# Patient Record
Sex: Male | Born: 1950 | Race: White | Hispanic: No | Marital: Married | Smoking: Current every day smoker
Health system: Southern US, Community
[De-identification: ages and names within clinical notes are randomized; demographics above are authoritative.]

## PROBLEM LIST (undated history)

## (undated) DIAGNOSIS — M4306 Spondylolysis, lumbar region: Secondary | ICD-10-CM

## (undated) DIAGNOSIS — F419 Anxiety disorder, unspecified: Secondary | ICD-10-CM

## (undated) DIAGNOSIS — F320 Major depressive disorder, single episode, mild: Secondary | ICD-10-CM

## (undated) DIAGNOSIS — Z87442 Personal history of urinary calculi: Secondary | ICD-10-CM

## (undated) DIAGNOSIS — F4310 Post-traumatic stress disorder, unspecified: Secondary | ICD-10-CM

## (undated) DIAGNOSIS — M797 Fibromyalgia: Secondary | ICD-10-CM

## (undated) DIAGNOSIS — F32A Depression, unspecified: Secondary | ICD-10-CM

## (undated) DIAGNOSIS — M549 Dorsalgia, unspecified: Secondary | ICD-10-CM

## (undated) DIAGNOSIS — K219 Gastro-esophageal reflux disease without esophagitis: Secondary | ICD-10-CM

## (undated) DIAGNOSIS — Z860100 Personal history of colon polyps, unspecified: Secondary | ICD-10-CM

## (undated) DIAGNOSIS — G8929 Other chronic pain: Secondary | ICD-10-CM

## (undated) DIAGNOSIS — I10 Essential (primary) hypertension: Secondary | ICD-10-CM

## (undated) HISTORY — DX: Gastro-esophageal reflux disease without esophagitis: K21.9

## (undated) HISTORY — DX: Essential (primary) hypertension: I10

## (undated) HISTORY — DX: Anxiety disorder, unspecified: F41.9

## (undated) HISTORY — PX: OTHER SURGICAL HISTORY: SHX169

## (undated) HISTORY — PX: BACK SURGERY: SHX140

## (undated) HISTORY — DX: Depression, unspecified: F32.A

## (undated) HISTORY — DX: Major depressive disorder, single episode, mild: F32.0

---

## 1999-03-24 ENCOUNTER — Encounter: Payer: Self-pay | Admitting: Neurosurgery

## 1999-03-24 ENCOUNTER — Ambulatory Visit (HOSPITAL_COMMUNITY): Admission: RE | Admit: 1999-03-24 | Discharge: 1999-03-24 | Payer: Self-pay | Admitting: Neurosurgery

## 1999-04-10 ENCOUNTER — Ambulatory Visit (HOSPITAL_COMMUNITY): Admission: RE | Admit: 1999-04-10 | Discharge: 1999-04-10 | Payer: Self-pay | Admitting: Neurosurgery

## 1999-04-10 ENCOUNTER — Encounter: Payer: Self-pay | Admitting: Neurosurgery

## 1999-05-05 ENCOUNTER — Encounter: Payer: Self-pay | Admitting: Neurosurgery

## 1999-05-05 ENCOUNTER — Ambulatory Visit (HOSPITAL_COMMUNITY): Admission: RE | Admit: 1999-05-05 | Discharge: 1999-05-05 | Payer: Self-pay | Admitting: Neurosurgery

## 2000-11-11 ENCOUNTER — Ambulatory Visit (HOSPITAL_COMMUNITY): Admission: RE | Admit: 2000-11-11 | Discharge: 2000-11-11 | Payer: Self-pay | Admitting: Family Medicine

## 2000-11-11 ENCOUNTER — Encounter: Payer: Self-pay | Admitting: Family Medicine

## 2001-02-19 ENCOUNTER — Encounter: Payer: Self-pay | Admitting: Specialist

## 2001-02-19 ENCOUNTER — Encounter: Admission: RE | Admit: 2001-02-19 | Discharge: 2001-02-19 | Payer: Self-pay | Admitting: Specialist

## 2002-06-17 ENCOUNTER — Ambulatory Visit (HOSPITAL_COMMUNITY): Admission: RE | Admit: 2002-06-17 | Discharge: 2002-06-17 | Payer: Self-pay | Admitting: Family Medicine

## 2002-06-17 ENCOUNTER — Encounter: Payer: Self-pay | Admitting: Family Medicine

## 2002-07-07 ENCOUNTER — Encounter: Payer: Self-pay | Admitting: Neurosurgery

## 2002-07-07 ENCOUNTER — Encounter: Admission: RE | Admit: 2002-07-07 | Discharge: 2002-07-07 | Payer: Self-pay | Admitting: Neurosurgery

## 2002-07-27 ENCOUNTER — Encounter: Payer: Self-pay | Admitting: Neurosurgery

## 2002-07-27 ENCOUNTER — Encounter: Admission: RE | Admit: 2002-07-27 | Discharge: 2002-07-27 | Payer: Self-pay | Admitting: Neurosurgery

## 2002-09-11 ENCOUNTER — Encounter: Payer: Self-pay | Admitting: Neurosurgery

## 2002-09-15 ENCOUNTER — Encounter: Payer: Self-pay | Admitting: Neurosurgery

## 2002-09-15 ENCOUNTER — Inpatient Hospital Stay (HOSPITAL_COMMUNITY): Admission: RE | Admit: 2002-09-15 | Discharge: 2002-09-16 | Payer: Self-pay | Admitting: Neurosurgery

## 2002-11-05 ENCOUNTER — Encounter: Payer: Self-pay | Admitting: Neurosurgery

## 2002-11-05 ENCOUNTER — Ambulatory Visit (HOSPITAL_COMMUNITY): Admission: RE | Admit: 2002-11-05 | Discharge: 2002-11-05 | Payer: Self-pay | Admitting: Neurosurgery

## 2003-03-01 ENCOUNTER — Ambulatory Visit (HOSPITAL_COMMUNITY): Admission: RE | Admit: 2003-03-01 | Discharge: 2003-03-01 | Payer: Self-pay | Admitting: Family Medicine

## 2003-03-30 ENCOUNTER — Ambulatory Visit (HOSPITAL_COMMUNITY): Admission: RE | Admit: 2003-03-30 | Discharge: 2003-03-30 | Payer: Self-pay | Admitting: Family Medicine

## 2003-05-14 ENCOUNTER — Inpatient Hospital Stay (HOSPITAL_COMMUNITY): Admission: RE | Admit: 2003-05-14 | Discharge: 2003-05-18 | Payer: Self-pay | Admitting: Neurosurgery

## 2003-09-02 ENCOUNTER — Ambulatory Visit (HOSPITAL_COMMUNITY): Admission: RE | Admit: 2003-09-02 | Discharge: 2003-09-02 | Payer: Self-pay | Admitting: Internal Medicine

## 2003-12-15 ENCOUNTER — Ambulatory Visit (HOSPITAL_COMMUNITY): Admission: RE | Admit: 2003-12-15 | Discharge: 2003-12-15 | Payer: Self-pay | Admitting: Neurosurgery

## 2003-12-28 ENCOUNTER — Emergency Department (HOSPITAL_COMMUNITY): Admission: EM | Admit: 2003-12-28 | Discharge: 2003-12-28 | Payer: Self-pay | Admitting: Emergency Medicine

## 2004-03-21 ENCOUNTER — Ambulatory Visit (HOSPITAL_COMMUNITY): Admission: RE | Admit: 2004-03-21 | Discharge: 2004-03-21 | Payer: Self-pay | Admitting: Family Medicine

## 2004-09-25 ENCOUNTER — Ambulatory Visit (HOSPITAL_COMMUNITY): Admission: RE | Admit: 2004-09-25 | Discharge: 2004-09-25 | Payer: Self-pay | Admitting: Orthopedic Surgery

## 2005-03-11 ENCOUNTER — Inpatient Hospital Stay (HOSPITAL_COMMUNITY): Admission: EM | Admit: 2005-03-11 | Discharge: 2005-03-14 | Payer: Self-pay | Admitting: Emergency Medicine

## 2005-03-12 ENCOUNTER — Ambulatory Visit: Payer: Self-pay | Admitting: Internal Medicine

## 2005-03-13 ENCOUNTER — Encounter: Payer: Self-pay | Admitting: Internal Medicine

## 2005-03-13 HISTORY — PX: ESOPHAGOGASTRODUODENOSCOPY: SHX1529

## 2005-03-13 HISTORY — PX: COLONOSCOPY: SHX174

## 2005-03-30 ENCOUNTER — Ambulatory Visit (HOSPITAL_COMMUNITY): Admission: RE | Admit: 2005-03-30 | Discharge: 2005-03-30 | Payer: Self-pay | Admitting: Neurosurgery

## 2005-07-14 ENCOUNTER — Emergency Department (HOSPITAL_COMMUNITY): Admission: EM | Admit: 2005-07-14 | Discharge: 2005-07-14 | Payer: Self-pay | Admitting: Emergency Medicine

## 2006-04-17 ENCOUNTER — Ambulatory Visit (HOSPITAL_COMMUNITY): Admission: RE | Admit: 2006-04-17 | Discharge: 2006-04-17 | Payer: Self-pay | Admitting: Neurosurgery

## 2006-04-23 HISTORY — PX: NECK SURGERY: SHX720

## 2006-04-26 ENCOUNTER — Ambulatory Visit (HOSPITAL_COMMUNITY): Admission: RE | Admit: 2006-04-26 | Discharge: 2006-04-27 | Payer: Self-pay | Admitting: Neurosurgery

## 2006-12-30 ENCOUNTER — Ambulatory Visit (HOSPITAL_COMMUNITY): Admission: RE | Admit: 2006-12-30 | Discharge: 2006-12-30 | Payer: Self-pay | Admitting: Neurosurgery

## 2007-10-29 ENCOUNTER — Emergency Department (HOSPITAL_COMMUNITY): Admission: EM | Admit: 2007-10-29 | Discharge: 2007-10-29 | Payer: Self-pay | Admitting: Emergency Medicine

## 2009-05-26 ENCOUNTER — Emergency Department (HOSPITAL_COMMUNITY): Admission: EM | Admit: 2009-05-26 | Discharge: 2009-05-26 | Payer: Self-pay | Admitting: Emergency Medicine

## 2010-05-13 ENCOUNTER — Encounter: Payer: Self-pay | Admitting: Neurosurgery

## 2010-07-12 LAB — URINALYSIS, ROUTINE W REFLEX MICROSCOPIC
Bilirubin Urine: NEGATIVE
Glucose, UA: NEGATIVE mg/dL
Ketones, ur: NEGATIVE mg/dL
Nitrite: NEGATIVE
Protein, ur: NEGATIVE mg/dL
Specific Gravity, Urine: 1.025 (ref 1.005–1.030)
Urobilinogen, UA: 0.2 mg/dL (ref 0.0–1.0)
pH: 5 (ref 5.0–8.0)

## 2010-07-12 LAB — URINE MICROSCOPIC-ADD ON

## 2010-07-12 LAB — DIFFERENTIAL
Basophils Absolute: 0 10*3/uL (ref 0.0–0.1)
Basophils Relative: 0 % (ref 0–1)
Eosinophils Absolute: 0.2 K/uL (ref 0.0–0.7)
Eosinophils Relative: 2 % (ref 0–5)
Lymphocytes Relative: 20 % (ref 12–46)
Lymphs Abs: 2.4 K/uL (ref 0.7–4.0)
Monocytes Absolute: 1.1 10*3/uL — ABNORMAL HIGH (ref 0.1–1.0)
Monocytes Relative: 9 % (ref 3–12)
Neutro Abs: 8.6 10*3/uL — ABNORMAL HIGH (ref 1.7–7.7)
Neutrophils Relative %: 70 % (ref 43–77)

## 2010-07-12 LAB — BASIC METABOLIC PANEL WITH GFR
BUN: 16 mg/dL (ref 6–23)
Chloride: 106 meq/L (ref 96–112)
Creatinine, Ser: 1.32 mg/dL (ref 0.4–1.5)
GFR calc Af Amer: >60 mL/min (ref >60)
GFR calc non Af Amer: 56 mL/min — ABNORMAL LOW (ref >60)
Potassium: 4 meq/L (ref 3.5–5.1)

## 2010-07-12 LAB — URINE CULTURE
Colony Count: NO GROWTH
Culture: NO GROWTH

## 2010-07-12 LAB — CBC
HCT: 47 % (ref 39.0–52.0)
Hemoglobin: 16.3 g/dL (ref 13.0–17.0)
MCHC: 34.7 g/dL (ref 30.0–36.0)
MCV: 96.1 fL (ref 78.0–100.0)
Platelets: 236 K/uL (ref 150–400)
RBC: 4.89 MIL/uL (ref 4.22–5.81)
RDW: 12.5 % (ref 11.5–15.5)
WBC: 12.3 K/uL — ABNORMAL HIGH (ref 4.0–10.5)

## 2010-07-12 LAB — BASIC METABOLIC PANEL
CO2: 25 mEq/L (ref 19–32)
Calcium: 9 mg/dL (ref 8.4–10.5)
Glucose, Bld: 98 mg/dL (ref 70–99)
Sodium: 137 mEq/L (ref 135–145)

## 2010-09-08 NOTE — Discharge Summary (Signed)
NAME:  Perry Cervantes, Perry Cervantes                       ACCOUNT NO.:  [id number]   MEDICAL RECORD NO.:  [id number]                   PATIENT TYPE:  INP   LOCATION:  3023                                 FACILITY:  [facility]   PHYSICIAN:  Danae Orleans. Venetia Maxon, M.D.               DATE OF BIRTH:  08/22/1950   DATE OF ADMISSION:  05/14/2003  DATE OF DISCHARGE:  05/18/2003                                 DISCHARGE SUMMARY   REASON FOR ADMISSION:  1. Lumbar disk displacement.  2. Lumbosacral spondylolisthesis,  3. Lumbar disk degeneration.  4. Hypertension, not otherwise specified.  5. Esophageal reflux.  6. Tobacco use disorder.   FINAL DIAGNOSES:  1. Lumbar disk displacement.  2. Lumbosacral spondylolisthesis,  3. Lumbar disk degeneration.  4. Hypertension, not otherwise specified.  5. Esophageal reflux.  6. Tobacco use disorder.   HISTORY OF ILLNESS AND HOSPITAL COURSE:  [name] is a 60 year old  man who has previously gone out on disability retirement for lumbar disk  degenerative disease and also had a L4-5 disk herniation laterally. The  patient had surgery for that and actually got considerably better. He then  came back to the office after a fall saying that he was having a lot more  back pain, pain going into his legs. He had radiographs which demonstrated  inferior facet fractures at L4-5 with retrolisthesis although no frank  significant motion at L4-5 on flexion extension views. The patient was  miserable with pain, remained predominantly in his back, and is therefore  elected to take him to surgery for lumbar decompression and fusion at the L4-  5 level. At the time of surgery, he was found to have L4 facet fractures,  spondylosis, degenerative disk disease, recurrent disk herniation, and  radiculopathy. He underwent decompression and fusion at the L4-5 level.  Postoperatively, he had full strength in his lower extremities with  decreased pain and was doing well on January 25. He  was up and ambulating in  a brace without significant pain or weakness. He was discharged home on  Percocet.   DISCHARGE CONDITION:  Improved. Follow up in three weeks.                                                Danae Orleans. Venetia Maxon, M.D.    JDS/MEDQ  D:  07/05/2003  T:  07/06/2003  Job:  [id number]

## 2010-09-08 NOTE — Op Note (Signed)
NAME:  Perry Cervantes, Perry Cervantes                       ACCOUNT NO.:  [id number]   MEDICAL RECORD NO.:  [id number]                   PATIENT TYPE:  INP   LOCATION:  3172                                 FACILITY:  [facility]   PHYSICIAN:  Danae Orleans. Venetia Maxon, M.D.               DATE OF BIRTH:  Jun 12, 1950   DATE OF PROCEDURE:  09/15/2002  DATE OF DISCHARGE:                                 OPERATIVE REPORT   PREOPERATIVE DIAGNOSIS:  Far-lateral herniated disk, L4-5, right, with  spondylosis, degenerative disk disease and radiculopathy.   POSTOPERATIVE DIAGNOSIS:  Far-lateral herniated disk, L4-5, right, with  spondylosis, degenerative disk disease and radiculopathy.   PROCEDURE:  Far-lateral microdiskectomy, L4-5, right, with Metrix tubular  retractor and microdissection.   SURGEON:  Danae Orleans. Venetia Maxon, M.D.   ASSISTANT:  Hewitt Shorts, M.D.   ANESTHESIA:  General endotracheal anesthesia.   ESTIMATED BLOOD LOSS:  Minimal.   COMPLICATIONS:  None.   DISPOSITION:  Recovery.   INDICATION:  The patient is a 60 year old man with a right L4 radiculopathy,  with a far-lateral disk herniation at the L4-5 level.  He has undergone  multiple steroid injections without relief of pain and it was subsequently  elected to take him to surgery for a far-lateral microdiskectomy.   PROCEDURE:  The patient was brought to the operating room.  Following the  successful and uncomplicated induction of general endotracheal anesthesia,  the patient was placed in a prone position on the operating table.  His low  back was then prepped and draped in the usual sterile fashion.  Using  fluoroscopy to localize, a marker was placed between the two pedicles on the  right side, the pedicle of L4 and the pedicle of L5, and a Steinmann was  placed over the pars interarticularis of L4.  The sequential dilators were  then used and subsequently, a 7-mm-long Metrix tubular retractor was placed  and anchored.  Its position was  confirmed on AP and lateral fluoroscopy.  Subsequently, using electrocautery, soft tissue overlying the L4 pars was  then cauterized and then removed.  The pars interarticularis was then  drilled down with the Anspach drill and an A2 equivalent burr and removal of  bone was completed with a 3-mm Kerrison rongeur.  The L4 nerve root was  identified just beneath the ligamentous layer, which was removed with the  Kerrison rongeur, and the nerve root was identified as it coursed overlying  the L4-5 lateral disk.  There was a large amount of osteophyte and also  herniated disk material and the disk space was then incised with an 11 blade  and disk material was removed in a piecemeal fashion.  Subsequently, the  osteophytes were removed with the osteophyte-removing tool and this resulted  in significant decompression of the nerve root.  Using microdissection  technique, the endplates were stripped of residual disk material and the  lateral-most aspect of the interspace  was also decompressed.  Medial and  lateral decompression was performed using the pituitary rongeurs.  The wound  was then copiously irrigated with Bacitracin and saline and then Gelfoam  with thrombin was used to facilitate hemostasis.  Subsequently, the tubular  retractor was removed.  The wound was then closed with 3-0 Vicryl sutures  and dressed with Dermabond.  The patient was extubated in the operating room  and taken to the recovery room in stable and satisfactory condition, having  tolerated his operation well.  Counts were correct at the end of the case.                                               Danae Orleans. Venetia Maxon, M.D.    JDS/MEDQ  D:  09/15/2002  T:  09/15/2002  Job:  [id number]

## 2010-09-08 NOTE — Consult Note (Signed)
NAME:  Perry Cervantes, Perry Cervantes             ACCOUNT NO.:  [id number]   MEDICAL RECORD NO.:  [id number]          PATIENT TYPE:  INP   LOCATION:  A319                          FACILITY:  [facility]   PHYSICIAN:  R. Roetta Sessions, M.D. DATE OF BIRTH:  [birth year]   DATE OF CONSULTATION:  DATE OF DISCHARGE:                                   CONSULTATION   REQUESTING PHYSICIAN:  Patrica Duel, M.D.   REASON FOR CONSULTATION:  Abdominal pain, diarrhea, intermittent  hematochezia.   HISTORY OF PRESENT ILLNESS:  Perry Cervantes is a 60 year old Caucasian male who  notes about three days ago he began to have mid-to-low abdominal pain. He is  a security guard, and he felt the pain just below his belt line.  He  describes the pain as burning.  The pain progressively got worse.  It is a  constant pain.  He did have shooting pains down into his groin as well.  He  had significant abdominal bloating and rated the pain a 9/10 on the pain  scale.  He had some nausea but denied any vomiting.  He continued to have  dry heaves as well.  He also complains of heartburn and water brash which is  refractory.  He has been on Nexium 40 mg daily and has had chronic GERD  symptoms for 3 years now.  He denies any dysphagia or odynophagia.  He  denies any regurgitation.  On admission his white blood cell count was 10.6.  It is now up to 13.9.  He has been given Dilaudid 2 mg q.3 h. p.r.n. for  pain which has worked well for him. He has had a CT of the abdomen and  pelvis which reportedly was normal, although I have not seen the report.  He  complains of diarrhea as well.  He has had loose watery stools up to 5x a  day for the last 3 days.  He denies any new medications, foreign travel, or  new pets.  He has been on antibiotics within the last 3-4 months.  He takes  Advil 200 mg 6-8 a day, about 4 days a week.   PAST MEDICAL HISTORY:  1.  Hypertension.  2.  Chronic GERD.  3.  Vertebral fracture with back surgery and chronic  back pain, right knee      arthroscopy and depression.   MEDICATIONS PRIOR TO ADMISSION:  1.  Percocet p.r.n.  2.  Nexium 40 mg daily.  3.  Norvasc 5 mg daily.  4.  Ziac 10 mg daily.   ALLERGIES:  No known drug allergies.   FAMILY HISTORY:  Mother age 11 has a history of MI.  Father deceased at age  66 secondary to staph sepsis.  He has one brother deceased secondary to an  MVA.  One brother and one sister who is alive and healthy   SOCIAL HISTORY:  Perry Cervantes has been in his second marriage for 13 years.  He has five grown, healthy children.  One step-child.  He has a 30-pack-year  history of tobacco use.  He drinks occasional beer once or twice  a month.  He denies any drug use.  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   REVIEW OF SYSTEMS:  CONSTITUTIONAL:  Weight has been steadily decreasing  since he has gone back to work; he feels he is not consuming as many  calories.  He is complaining of some fatigue; he denies any fever or chills.  Denies any anorexia or early satiety.  CARDIOVASCULAR:  Denies any chest  pain or palpitations.  PULMONARY:  Denies any shortness of breath, dyspnea,  cough or hemoptysis.  GI:  See HPI.  GU: Denies any dysuria, hematuria,  increased urinary frequency.  He did report some scrotal swelling a couple  of months ago for which he was seen at [facility].  He was  treated with antibiotics.   PHYSICAL EXAMINATION:  VITAL SIGNS:  Weight 193.4 pounds.  Height 71 inches.  Temperature 98.2, pulse 95, respirations 20, blood pressure 126/72.  GENERAL:  Perry Cervantes is a well-developed, well-nourished, Caucasian male in  no acute distress.  HEENT:  Sclerae are clear.  Nonicteric.  Conjunctivae pink. Oropharynx pink  and moist without any lesions.  NECK:  Supple without any masses or thyromegaly.  HEART:  Regular rate and rhythm with normal S1-S2.  No murmurs, rubs,  thrills, or  gallops  LUNGS:  Clear to auscultation bilaterally.  ABDOMEN:  Positive bowel sounds x4; no bruits auscultated. Soft, nontender,  nondistended.  No palpable mass or hepatosplenomegaly.  No rebound  tenderness or guarding.  EXTREMITIES:  Without edema or clubbing bilaterally.  SKIN:  Pink, warm and dry.  He does have a ruddy facial complexion.  No rash  or jaundice.   LABORATORY STUDIES:  WBC 13.9 and hemoglobin 13.5, hematocrit 38.1,  platelets 219.  Calcium 8.5, sodium 139, potassium 3.2, chloride 112, CO2  20, BUN 16, creatinine 1, and glucose 95, amylase 48, and lipase 19.  Serum  alcohol was less than 5.  Urinalysis trace ketones, 30 protein, 36 wbc's,  rbc's, and rare bacteria.   IMPRESSION:  Perry Cervantes is a 60 year old Caucasian male with a 3-day  history of mid abdominal pain which he describes as a burning, 9/10 on a  pain scale along with nausea and bloating.  CT scan reportedly was normal,  although this needs to be reviewed.  He also has had watery diarrhea up to 5  stools a day. He has a 30-pack-year history of tobacco abuse, therefore,  could be __________ .  We need to consider ischemic colitis.  Also he has  significant NSAID use and we should think about NSAID induced colitis as  well.  Other possibilities include food borne illness, or a gastroenteritis.  He has had intermittent hematochezia as well.   As far as his chronic refractory GERD symptoms are concerned, we will  proceed with EGD given his chronic NSAID use.  He is also at risk for peptic  ulcer disease as well.   PLAN:  1.  We will schedule colonoscopy and EGD in the morning with Dr. Jena Gauss.  I      have discussed both procedures including risks and benefits which      include, but are not limited to, bleeding, perforation, and infection,      and drug reaction.  He agrees with this plan, and consent will be      obtained. 2.  We will check CBC and LFTs in the morning.  3.  Clear liquid diet for now  until midnight and then NPO after midnight for      procedure.  We will prep with GoLYTELY, 4 liters over 4 hours to begin      at 5 p.m.; followed by 2 Fleets enemas in the morning prior to the      procedure.      Nicholas Lose, N.P.      Jonathon Bellows, M.D.  Electronically Signed    KC/MEDQ  D:  03/12/2005  T:  03/12/2005  Job:  [id number]   cc:   Patrica Duel, M.D.  Fax: [phone]

## 2010-09-08 NOTE — Discharge Summary (Signed)
Perry Cervantes, Perry Cervantes NO.:  [id number]   MEDICAL RECORD NO.:  [id number]          PATIENT TYPE:  INP   LOCATION:  A319                          FACILITY:  [facility]   PHYSICIAN:  Patrica Duel, M.D.    DATE OF BIRTH:  1950/08/09   DATE OF ADMISSION:  03/11/2005  DATE OF DISCHARGE:  11/22/2006LH                                 DISCHARGE SUMMARY   DISCHARGE DIAGNOSES:  1.  Abdominal pain, questionable etiology, probably food-borne illness.      Hepatic flexure polyp on total colonoscopy, pathology benign.  2.  Chronic uncomplicated gastroesophageal reflux disease.  3.  History of hypertension.  4.  Chronic pain secondary to degenerative disk disease (status post several      surgeries).  5.  Mild depression.   HISTORY AND PHYSICAL:  For details regarding admission please refer to the  admitting note.  Briefly this 60 year old male with above history presented  to the emergency department with a 48-hour history of increasing severe  abdominal bloating, intermittent diarrhea, and scant hematochezia.  He had  no urogenital complaints except for some radiation of his pain to the groin.  Workup in the emergency department was benign except for a leukocytosis  (11,000) with left shift.  He had 3-6 white cells.  CT scan negative.  All  laboratory parameters normal except as noted.   Patient was admitted with abdominal pain of questionable etiology, consider  acute colitis.   COURSE IN THE HOSPITAL:  Patient was treated with analgesia and anorexics.  He was also given Cipro and Flagyl empirically.  GI was consulted.  Upper  endoscopy revealed uncomplicated gastroesophageal reflux disease and total  colonoscopy results as noted above.   The patient's symptoms abated and he was stable for discharge on third  hospital day.  Percocet 10/650 daily, Nexium 40 mg daily, Norvasc 5 daily,  Ziac 10 daily.  He will be followed and treated expectantly as an  outpatient.      Patrica Duel, M.D.  Electronically Signed     MC/MEDQ  D:  03/25/2005  T:  03/25/2005  Job:  [id number]

## 2010-09-08 NOTE — H&P (Signed)
NAME:  Perry Cervantes, Perry Cervantes NO.:  [id number]   MEDICAL RECORD NO.:  [id number]          PATIENT TYPE:  INP   LOCATION:  A319                          FACILITY:  [facility]   PHYSICIAN:  Patrica Duel, M.D.    DATE OF BIRTH:  March 10, 1951   DATE OF ADMISSION:  03/11/2005  DATE OF DISCHARGE:  LH                                HISTORY & PHYSICAL   CHIEF COMPLAINT:  Abdominal pain.   HISTORY OF PRESENT ILLNESS:  This is a 60 year old male with a history of  hypertension, gastroesophageal reflux disease, chronic pain secondary to  back surgery and mild depression.  His general state of health is good.   The patient presented to the emergency department with a 48 hour history of  increasingly severe abdominal bloating, intermittent diarrhea with scant  hematochezia.  He had no urogenital complaints except for some radiation of  his pain to his groin.  A workup in the emergency department was essentially  benign with negative CT scan as well as normal lab except for mild  leukocytosis (11,000) with left shift.  Urinalysis significant for 3-6  wbc's, otherwise benign.  All blood parameters including liver functions,  amylase, lipase, etc. are normal.   There has been no history of headache, neurologic deficits, significant  nausea, vomiting, chest pain or shortness of breath, dysuria, hematuria or  urinary frequency or symptoms of prostatism.   The patient is admitted with abdominal pain of questionable etiology.  Consider colitis.   CURRENT MEDICATIONS:  1.  Percocet p.r.n.  2.  Nexium 40 mg daily.  3.  Norvasc 5 mg daily.  4.  Ziac 10 mg daily.   ALLERGIES:  No known drug allergies.   PAST MEDICAL HISTORY:  As noted above.   FAMILY HISTORY:  Noncontributory.   REVIEW OF SYSTEMS:  Negative except as mentioned.   PHYSICAL EXAMINATION:  GENERAL:  A very pleasant, fully alert male in no  acute distress at this time.  VITAL SIGNS:  Temperature 98.7, blood pressure 135/80,  pulse 119 and  unlabored, respirations 20.  HEENT:  Normocephalic, atraumatic.  Pupils are equal.  There is no scleral  icterus.  Ears, nose and throat benign.  NECK:  Supple with no bruits, lymphadenopathy or masses noted.  LUNGS:  Clear.  HEART:  Sounds normal without murmurs, rubs or gallops.  ABDOMEN:  The right upper quadrant is nontender.  Murphy's sign is negative.  There are no appreciable masses.  GENITALIA:  Testes normal.  EXTREMITIES:  No clubbing, cyanosis or edema.  NEUROLOGIC:  Within normal limits.   ASSESSMENT:  Abdominal pain of questionable etiology, possible colitis.  The  patient is a smoker and could have ischemic component.  Full  gastrointestinal evaluation indicated.   PLAN:  GI consult.  Continue IV PPI.  Will keep NPO as colonoscopy is  probably indicated.  Will follow and treat expectantly.      Patrica Duel, M.D.  Electronically Signed     MC/MEDQ  D:  03/12/2005  T:  03/12/2005  Job:  [id number]

## 2010-09-08 NOTE — Op Note (Signed)
NAME:  Perry Cervantes NO.:  [id number]   MEDICAL RECORD NO.:  [id number]          PATIENT TYPE:  OIB   LOCATION:  3172                         FACILITY:  [facility]   PHYSICIAN:  Danae Orleans. Venetia Maxon, M.D.  DATE OF BIRTH:  [birth year]   DATE OF PROCEDURE:  04/26/2006  DATE OF DISCHARGE:                               OPERATIVE REPORT   PREOPERATIVE DIAGNOSIS:  Herniated cervical disk with spondylosis,  degenerative disc disease, and radiculopathy C5-C6 and C6-C7 levels.   POSTOPERATIVE DIAGNOSIS:  Herniated cervical disk with spondylosis,  degenerative disc disease, and radiculopathy C5-C6 and C6-C7 levels.   PROCEDURE:  Anterior cervical decompression and fusion C5-C6 and C6-C7  levels with PEEK interbody cages with morcellized bone autograft,  demineralized bone matrix, and anterior cervical plating.   SURGEON:  Danae Orleans. Venetia Maxon, M.D.   ASSISTANT:  Coletta Memos, M.D. and Georgiann Cocker, RN   ANESTHESIA:  General endotracheal anesthesia.   ESTIMATED BLOOD LOSS:  Minimal.   COMPLICATIONS:  None.   DISPOSITION:  Recovery.   INDICATIONS:  Perry Cervantes is a 60 year old man with left greater  than right upper extremity pain with significant spondylitic disk  degeneration and herniated disks at C5-C6 and C6-C7 levels.  It was  elected to take him to surgery for anterior cervical decompression and  fusion at the C5-C6, C6-C7 levels.   PROCEDURE:  Mr. Perry Cervantes is brought to the operating room.  Following  satisfactory and uncomplicated induction of general endotracheal  anesthesia and placement of intravenous lines, the patient was placed in  supine position on the operating table.  His neck was placed in slight  extension.  He was placed in 10 pounds of cervical traction and his  anterior neck was then prepped and draped in the usual sterile fashion.  The area of planned incision was infiltrated with 0.25% Marcaine and  0.5% lidocaine with 1:100,000 epinephrine.   Incision was made from  midline to the anterior border of the sternocleidomastoid muscle,  carried sharply through platysmal layer.  Subplatysmal dissection was  performed exposing the anterior border of the sternocleidomastoid  muscle.  Using blunt dissection, the carotid sheath was kept lateral and  trachea and esophagus kept medial, exposing the anterior cervical spine.  A bent spinal needle was placed.  It was felt to be the C4-C5 level and  this was confirmed on the next intraoperative x-ray.  Subsequently,  exposure was then performed the C5-C6 and C6-C7 and longus colli muscles  were taken down from the anterior cervical spine from C5-C7 levels  bilaterally using electrocautery and Key elevator.  Large ventral  osteophyte was removed at C6 overlying the C6-C7 interspace and this was  retained for later use in bone grafting.  Self-retaining shadow line  retractor was placed along with up and down retractor.  Interspaces at  C5-C6 and C6-C7 were highly degenerated.  They were incised and disk  material was removed in piecemeal fashion.  End plates were stripped of  residual disk material using a variety of Carlen curets.  Subsequently,  the disk base distractor was placed at C6-C7 and under loupe  magnification using a high-speed drill, the endplates of C6 and C7 were  decorticated, along with large uncinate spurs, which were thinned.  This  bone graft was then retained for later use in bone grafting.  A similar  decompression was then performed at the C5-C6 level and again bone  autograft was retained for later use in bone grafting.  End plates were  decorticated and uncinate spurs were drilled down.  The microscope was  brought into field.  Under microdissection tech, the posterior  longitudinal ligament at C6-C7 was then removed and the spinal cord dura  was decompressed as were both C7 nerve roots as they extended out the  neural foramina.  There was at disk material overlying the  C7 nerve root  on the left.  After hemostasis was assured and following trial sizing, a  7-mm PEEK interbody cage was selected, packed with morcellized bone  autograft which was mixed with small amount of demineralized bone matrix  and this was inserted in the interspace and countersunk appropriately.  Attention was then turned to the C5-C6 level where a similar  decompression was performed.  Again, both C6 nerve roots and the central  spinal cord dura were decompressed.  There was a large amount of central  to left-sided herniated disk material at this level.  Hemostasis again  assured and subsequently a 7 PEEK interbody spacer was selected, packed  with remaining morcellized bone autograft, and demineralized bone matrix  inserted interspace and countersunk appropriately.  Traction weight was  removed.  A 34-mm Tressel anterior cervical plate was then affixed to  the cervical spine using 14-mm variable angle screws, 2 at C5, 2 at C6,  2 at C7.  Final x-ray demonstrated the upper aspect of the construct  which was at C5 and appeared to be well positioned.  It was not possible  to visualize the lower aspect of the construct.  Subsequently, the wound  was irrigated with bacitracin saline.  Soft tissues were inspected and  found to be good repair.  There was excellent hemostasis.  The platysmal  layer was then closed with 3-0 Vicryl sutures.  The skin edges were  approximated with 3-0 Vicryl inverted interrupted sutures.  The wound  was dressed with Dermabond.  The patient was extubated in the operating  room and taken to the recovery room in stable satisfactory having  tolerated this operation well.  Counts were correct at the end of the  case.      Danae Orleans. Venetia Maxon, M.D.  Electronically Signed     JDS/MEDQ  D:  04/26/2006  T:  04/26/2006  Job:  [id number]

## 2010-09-08 NOTE — Op Note (Signed)
NAME:  Perry Cervantes, Perry Cervantes             ACCOUNT NO.:  [id number]   MEDICAL RECORD NO.:  [id number]          PATIENT TYPE:  INP   LOCATION:  A319                          FACILITY:  [facility]   PHYSICIAN:  R. Roetta Sessions, M.D. DATE OF BIRTH:  April 02, 1951   DATE OF PROCEDURE:  03/13/2005  DATE OF DISCHARGE:                                 OPERATIVE REPORT   PROCEDURE:  Diagnostic esophagogastroduodenoscopy followed by colonoscopy  with biopsy.   INDICATIONS FOR PROCEDURE:  The patient is a 60 year old Caucasian male  admitted with abdominal cramps and bloody stools and regular NSAID use. His  hemoglobin remains normal. White count is normal today. LFTs are normal. EGD  and colonoscopy are now being done. This approach has been discussed with  the patient at length. Potential risks, benefits, and alternatives have been  reviewed and questions answered. He is agreeable. Please see documentation  in the medical record.   PROCEDURE NOTE:  O2 saturation, blood pressure, pulse, and respirations were  monitored throughout the entire procedure. Conscious sedation with Versed 5  mg IV and Demerol 125 mg IV in divided doses for both procedure. Cetacaine  spray for topical oropharyngeal anesthesia.   FINDINGS:  Esophagogastroduodenoscopy:  Examination of the tubular esophagus  revealed no mucosal abnormalities. EG junction easily traversed.   Stomach:  Gastric cavity was empty and insufflated well with air. Thorough  examination of gastric mucosa including retroflexed view of the proximal  stomach and esophagogastric junction demonstrated no abnormalities aside  from small hiatal hernia. Pylorus patent and easily traversed. Examination  of bulb and second portion revealed no abnormalities.   THERAPEUTIC/DIAGNOSTIC MANEUVERS:  None.   The patient tolerated the procedure well and was prepared for colonoscopy.  Digital rectal exam revealed no abnormalities.   ENDOSCOPIC FINDINGS:  Prep was good.   Rectum:  Examination of the rectal mucosa including retroflexed view of the  anal verge revealed no abnormalities.   Colon:  Colonic mucosa was surveyed from the rectosigmoid junction through  the left, transverse, and right colon to the area of the appendiceal  orifice, ileocecal valve, and cecum. These structures were well seen and  photographed for the record. Terminal ileum was intubated to 10 cm. From  this level, the scope was slowly withdrawn, and all previously mentioned  mucosal surfaces were again seen. The patient had a 3-mm polyp at hepatic  flexure which was cold biopsied/removed. The remainder of the colonic mucosa  appeared normal. Terminal ileal mucosa appeared normal. The patient  tolerated both procedures and was reactive to endoscopy.   IMPRESSION:  Esophagogastroduodenoscopy:  Small hiatal hernia, otherwise  normal esophagus, stomach, D1 and D2.   Colonoscopy findings:  Normal rectum. Diminutive polyp at hepatic flexure  cold biopsied/removed. Otherwise normal colon, normal terminal ileum.   I suspect the patient has had a trivial GI bleed.   Recent self-limiting food borne illness is not excluded.   RECOMMENDATIONS:  1.  Regular diet.  2.  Home soon.  3.  Follow up on pathology.      Jonathon Bellows, M.D.  Electronically Signed  RMR/MEDQ  D:  03/13/2005  T:  03/13/2005  Job:  [id number]   cc:   Patrica Duel, M.D.  Fax: [phone]

## 2010-09-08 NOTE — Op Note (Signed)
NAME:  Perry Cervantes, Perry Cervantes                       ACCOUNT NO.:  [id number]   MEDICAL RECORD NO.:  [id number]                   PATIENT TYPE:  INP   LOCATION:  3023                                 FACILITY:  [facility]   PHYSICIAN:  Danae Orleans. Venetia Maxon, M.D.               DATE OF BIRTH:  [birth year]   DATE OF PROCEDURE:  05/14/2003  DATE OF DISCHARGE:                                 OPERATIVE REPORT   PREOPERATIVE DIAGNOSES:  1. Bilateral L4 facet fractures.  2. Spondylosis.  3. Degenerative disk disease.  4. Recurrent lumbar disk herniation.  5. Radiculopathy L4-5 level.   POSTOPERATIVE DIAGNOSES:  1. Bilateral L4 facet fractures.  2. Spondylosis.  3. Degenerative disk disease.  4. Recurrent lumbar disk herniation.  5. Radiculopathy L4-5 level.   PROCEDURE:  Redo laminectomy, L4-5, with redo diskectomy with transverse  lumbar interbody fusion, L4-5 level, with pedicle screw fixation, L4 through  L5 bilaterally, with posterolateral arthrodesis.   SURGEON:  Danae Orleans. Venetia Maxon, M.D.   ASSISTANT:  Hewitt Shorts, M.D.   ANESTHESIA:  General endotracheal anesthesia.   ESTIMATED BLOOD LOSS:  100 mL.   COMPLICATIONS:  None.   DISPOSITION:  To recovery.   INDICATIONS:  Cadden Elizondo is a 60 year old man who had previously  undergone far lateral diskectomy at L4-5 on the right, who fell and injured  his back a fairly long period after his surgery, who developed severe,  intractable low back pain.  X-rays demonstrated a fracture across the facets  at L4 and because of this, it was elected to take him back to surgery for  decompression and fusion.   DESCRIPTION OF PROCEDURE:  Mr. Juanetta Gosling was brought to the operating room.  Following satisfactory and uncomplicated induction of general endotracheal  anesthesia and placement of intravenous lines, the patient was placed in  prone position on the operating table.  His low back was then shaved,  prepped and draped in the usual sterile  fashion.  The area of planned  incision was infiltrated with 0.25% Marcaine and 1% lidocaine with 1:200,000  epinephrine.  An incision was made in the midline and carried through  subcutaneous tissues to the lumbar dorsal fascia, which was incised  bilaterally.  Subperiosteal dissection was performed, exposing the L4-5  interspace, and transverse processes of L4 and L5 were identified.  Marker  probes were placed, and intraoperative x-ray confirmed this to be the L4 and  L5 transverse processes.  A self-retaining retractor was placed to  facilitate exposure.  The right inferior facet was clearly fractured.  The  inferior facet was removed.  On the left the fracture was not as unstable,  but there did appear to be a fracture across the inferior facet, and this  fragment was also removed.  Subsequently hemilaminectomy of L4 was performed  on the left and subsequently on the right.  Care was taken with the scar  tissue on the right  to not damage the nerve as it coursed from the  extraforaminal space.  The diskectomy was then performed at L4-5 on the left  and the end plates were stripped of residual disk material.  It was elected  to place pedicle screws and then to distract off of those screws prior to  placing an interbody fusion graft, and consequently __________ pedicle  screws were placed, one 45 mm screw at L4 on the right and a 40 mm screw at  L5 on the right using the DI screw inferiorly.  Parallel distraction was  then performed with the 35 mm rod, which was locked into position.  further  diskectomy was then performed and after trial sizing with an 8 mm T-LIF  sizer, the Leopard 8 mm carbon fiber cage was then filled with morcellized  bone autograft and tamped into position, counter sunk appropriately.  Intraoperative use of fluoroscopy demonstrated this graft to be well-  positioned.  Additional bone graft was then placed overlying the spacer of  powdered bone drillings which had  been saved from the laminectomy as well as  morcellized local autograft.  This was tamped into position.  Subsequently  additional similarly-sized screws were placed at the L4 and L5 pedicles on  the left and the transverse processes of L4 and L5 were decorticated, and  bone allograft reconstituted with platelet-rich concentrate from the  Symphony system was then placed in the posterolateral region, L4 through L5  levels, bilaterally.  Prior to doing so the wound was extensively irrigated  with bacitracin and saline.  Subsequently the self-retaining retractor was  removed and the lumbar dorsal fascia was closed with 1 Vicryl suture, the  subcutaneous tissue was reapproximated with 2-0 Vicryl interrupted, inverted  sutures, and the skin edges were reapproximated with 3-0 Vicryl interrupted,  inverted stitches.  The wound was dressed with Dermabond and the patient was  extubated in the operating room, taken to the recovery room in stable and  satisfactory condition, having tolerated his operation well.  Counts correct  at the end of the case.                                               Danae Orleans. Venetia Maxon, M.D.    JDS/MEDQ  D:  05/14/2003  T:  05/15/2003  Job:  [id number]

## 2011-01-18 LAB — URINALYSIS, ROUTINE W REFLEX MICROSCOPIC
Nitrite: NEGATIVE
Urobilinogen, UA: 0.2

## 2011-01-18 LAB — CBC
HCT: 49.1
Hemoglobin: 17.2 — ABNORMAL HIGH
MCHC: 35.1
RDW: 12.5

## 2011-01-18 LAB — DIFFERENTIAL
Basophils Absolute: 0.1
Eosinophils Relative: 4
Lymphocytes Relative: 28
Monocytes Absolute: 0.9

## 2011-01-18 LAB — BASIC METABOLIC PANEL
CO2: 25
Glucose, Bld: 93
Potassium: 4.6
Sodium: 136

## 2011-01-29 ENCOUNTER — Ambulatory Visit (HOSPITAL_COMMUNITY)
Admission: RE | Admit: 2011-01-29 | Discharge: 2011-01-29 | Disposition: A | Discharge status: Home / Self Care | Payer: Medicare Other | Source: Ambulatory Visit | Attending: Family Medicine | Admitting: Family Medicine

## 2011-01-29 ENCOUNTER — Other Ambulatory Visit (HOSPITAL_COMMUNITY): Payer: Self-pay | Admitting: Family Medicine

## 2011-01-29 DIAGNOSIS — M25569 Pain in unspecified knee: Secondary | ICD-10-CM

## 2011-01-29 DIAGNOSIS — M25559 Pain in unspecified hip: Secondary | ICD-10-CM | POA: Insufficient documentation

## 2011-01-29 DIAGNOSIS — S99919A Unspecified injury of unspecified ankle, initial encounter: Secondary | ICD-10-CM | POA: Insufficient documentation

## 2011-01-29 DIAGNOSIS — S8990XA Unspecified injury of unspecified lower leg, initial encounter: Secondary | ICD-10-CM | POA: Insufficient documentation

## 2011-01-29 DIAGNOSIS — W19XXXA Unspecified fall, initial encounter: Secondary | ICD-10-CM | POA: Insufficient documentation

## 2012-02-26 LAB — CBC
HCT: 52 %
Hemoglobin: 17.7 g/dL — AB (ref 13.5–17.5)
MCV: 98 fL
WBC: 12.2

## 2012-02-26 LAB — COMPREHENSIVE METABOLIC PANEL
ALT: 19 U/L (ref 10–40)
Albumin: 5.1
BUN: 22 mg/dL — AB (ref 4–21)
Creat: 1.63
Sodium: 138 mmol/L (ref 137–147)
TSH: 1.6 u[IU]/mL (ref 0.41–5.90)

## 2012-03-07 ENCOUNTER — Encounter: Payer: Self-pay | Admitting: Internal Medicine

## 2012-03-10 ENCOUNTER — Ambulatory Visit: Payer: Medicare Other | Admitting: Gastroenterology

## 2012-03-19 ENCOUNTER — Encounter: Payer: Self-pay | Admitting: Internal Medicine

## 2012-03-24 ENCOUNTER — Ambulatory Visit (INDEPENDENT_AMBULATORY_CARE_PROVIDER_SITE_OTHER): Payer: Medicare Other | Admitting: Gastroenterology

## 2012-03-24 ENCOUNTER — Encounter: Payer: Self-pay | Admitting: Gastroenterology

## 2012-03-24 VITALS — BP 142/82 | HR 82 | Temp 98.5°F | Ht 70.0 in | Wt 214.8 lb

## 2012-03-24 DIAGNOSIS — K219 Gastro-esophageal reflux disease without esophagitis: Secondary | ICD-10-CM | POA: Insufficient documentation

## 2012-03-24 DIAGNOSIS — R195 Other fecal abnormalities: Secondary | ICD-10-CM

## 2012-03-24 MED ORDER — PANTOPRAZOLE SODIUM 40 MG PO TBEC: 40.0000 mg | DELAYED_RELEASE_TABLET | Freq: Every day | ORAL | Status: DC

## 2012-03-24 NOTE — Addendum Note (Signed)
Addended by: Tiffany Kocher on: 03/24/2012 09:00 AM   Modules accepted: Orders

## 2012-03-24 NOTE — Assessment & Plan Note (Signed)
Recent heme positive stool without anemia. Last EGD/colonoscopy in 2006. He has typical GERD symptoms when unable to afford his Nexium. C/O epigastric burning. On chronic NSAIDS. Offered EGD/TCS with Dr. Jena Gauss in the near future. Patient wants to wait until first of the year because his wife fractures right humerus and just had surgery on left rotator cuff and is a double sling.  I have discussed the risks, alternatives, benefits with regards to but not limited to the risk of reaction to medication, bleeding, infection, perforation and the patient is agreeable to proceed. Written consent to be obtained.  Continue Nexium for now. Samples provided. We will see which PPI is preferred on his drug plan and consider starting it the first of the year once out of the "donut hole".

## 2012-03-24 NOTE — Patient Instructions (Addendum)
We have scheduled you for an upper endoscopy and colonoscopy with Dr. Jena Gauss. Please see separate instructions.  Continue Nexium 40mg  daily for now. We have provided you with samples. I will have our nurse determine which acid reflux medication is preferred on your drug plan.

## 2012-03-24 NOTE — Progress Notes (Signed)
Primary Care Physician:  Kirstie Peri, MD  Primary Gastroenterologist:  Roetta Sessions, MD   Chief Complaint  Patient presents with  . heme + stools    HPI:  Perry Cervantes is a 61 y.o. male here at request of Dr. Sherryll Burger for further evaluation of heme positive stools. He recently went for yearly physical. Returned hemoccult cards which were positive. H/H normal, see below.  Generally he denies GI symptoms as long as he can stay on his Nexium. However, he states he cannot afford Nexium every month because he is in the "donut hole". May go a week without Nexium and then feels bad. On Nexium for 10-15 years. When misses Nexium, he develops epigastric burning. Lot of pressure building up. BM regular. No melena, brbpr. No dysphagia. Regurgitation at night. No weight loss.   Current Outpatient Prescriptions  Medication Sig Dispense Refill  . amLODipine (NORVASC) 10 MG tablet 10 mg daily.      Marland Kitchen aspirin 81 MG tablet Take 81 mg by mouth daily.      . bisoprolol-hydrochlorothiazide (ZIAC) 10-6.25 MG per tablet Take 1 tablet by mouth daily.       . cyclobenzaprine (FLEXERIL) 10 MG tablet Take 10 mg by mouth 2 (two) times daily as needed.       . diclofenac (VOLTAREN) 75 MG EC tablet Take 75 mg by mouth 2 (two) times daily.       Marland Kitchen lisinopril (PRINIVIL,ZESTRIL) 5 MG tablet Take 5 mg by mouth daily.       Marland Kitchen LYRICA 75 MG capsule Take 75 mg by mouth 2 (two) times daily.       Marland Kitchen NEXIUM 40 MG capsule Take 40 mg by mouth daily before breakfast.       . traZODone (DESYREL) 50 MG tablet Take 50 mg by mouth at bedtime.         Allergies as of 03/24/2012  . (No Known Allergies)    Past Medical History  Diagnosis Date  . Hypertension   . Gastroesophageal reflux disease   . Chronic pain     secondary to back surgery  . Mild depression   . Kidney stones     Past Surgical History  Procedure Date  . Esophagogastroduodenoscopy 03/13/2005    RMR: Small hiatal hernia  . Colonoscopy 03/13/2005    RMR:  Normal rectum. Diminutive polyp at hepatic flexure cold biopsied/removed (inflamed, benign). Otherwise normal colon, normal terminal ileum  . Neck surgery 2008  . Back surgery   . Right knee arthroscopy     Family History  Problem Relation Age of Onset  . Heart attack Mother     deceased age 59  . Other Father     deceased age 21 of Staph sepsis  . Colon cancer Neg Hx     History   Social History  . Marital Status: Married    Spouse Name: N/A    Number of Children: 5  . Years of Education: N/A   Occupational History  . retired from city of [place]    Social History Main Topics  . Smoking status: Current Every Day Smoker -- 0.5 packs/day    Types: Cigarettes  . Smokeless tobacco: Not on file  . Alcohol Use: No  . Drug Use: No  . Sexually Active: Not on file   Other Topics Concern  . Not on file   Social History Narrative  . No narrative on file      ROS:  General: Negative for anorexia,  weight loss, fever, chills, fatigue, weakness. Eyes: Negative for vision changes.  ENT: Negative for hoarseness, difficulty swallowing , nasal congestion. CV: Negative for chest pain, angina, palpitations, dyspnea on exertion, peripheral edema.  Respiratory: Negative for dyspnea at rest, dyspnea on exertion, cough, sputum, wheezing.  GI: See history of present illness. GU:  Negative for dysuria, hematuria, urinary incontinence, urinary frequency, nocturnal urination.  MS: Negative for joint pain. Chronic low back pain.  Derm: Negative for rash or itching.  Neuro: Negative for weakness, abnormal sensation, seizure, frequent headaches, memory loss, confusion.  Psych: Negative for anxiety, depression, suicidal ideation, hallucinations.  Endo: Negative for unusual weight change.  Heme: Negative for bruising or bleeding. Allergy: Negative for rash or hives.    Physical Examination:  BP 142/82  Pulse 82  Temp 98.5 F (36.9 C) (Temporal)  Ht 5\' 10"  (1.778 m)  Wt 214 lb 12.8 oz  (97.433 kg)  BMI 30.82 kg/m2   General: Well-nourished, well-developed in no acute distress.  Head: Normocephalic, atraumatic.   Eyes: Conjunctiva pink, no icterus. Mouth: Oropharyngeal mucosa moist and pink , no lesions erythema or exudate. Neck: Supple without thyromegaly, masses, or lymphadenopathy.  Lungs: Clear to auscultation bilaterally.  Heart: Regular rate and rhythm, no murmurs rubs or gallops.  Abdomen: Bowel sounds are normal, nontender, nondistended, no hepatosplenomegaly or masses, no abdominal bruits or    hernia , no rebound or guarding.   Rectal: defer Extremities: No lower extremity edema. No clubbing or deformities.  Neuro: Alert and oriented x 4 , grossly normal neurologically.  Skin: Warm and dry, no rash or jaundice.   Psych: Alert and cooperative, normal mood and affect.  Labs: 50,013. PSA 1.8, TSH 1.6, BUN 22, creatinine 1.63, glucose 97, sodium 138, potassium 4.5, calcium 9.9, albumin 5.1, total bilirubin 0.6, alkaline phosphatase 88, AST 14, ALT 19, white blood cell count 12,200, hemoglobin 17.7, MCV 98, platelets 277,000.  Imaging Studies: No results found.

## 2012-03-24 NOTE — Progress Notes (Signed)
Faxed to PCP

## 2012-03-24 NOTE — Assessment & Plan Note (Signed)
Pantoprazole is preferred on drug plan. Given written RX for him to fill after 04/23/12.

## 2012-06-25 ENCOUNTER — Encounter: Payer: Self-pay | Admitting: Gastroenterology

## 2012-07-21 ENCOUNTER — Ambulatory Visit (INDEPENDENT_AMBULATORY_CARE_PROVIDER_SITE_OTHER): Payer: Medicare Other | Admitting: Gastroenterology

## 2012-07-21 ENCOUNTER — Encounter (HOSPITAL_COMMUNITY): Payer: Self-pay | Admitting: Pharmacy Technician

## 2012-07-21 ENCOUNTER — Encounter: Payer: Self-pay | Admitting: Gastroenterology

## 2012-07-21 VITALS — BP 158/91 | HR 81 | Temp 97.2°F | Ht 69.0 in | Wt 224.8 lb

## 2012-07-21 DIAGNOSIS — R195 Other fecal abnormalities: Secondary | ICD-10-CM

## 2012-07-21 DIAGNOSIS — K219 Gastro-esophageal reflux disease without esophagitis: Secondary | ICD-10-CM

## 2012-07-21 MED ORDER — PEG 3350-KCL-NA BICARB-NACL 420 G PO SOLR: 4000.0000 mL | ORAL | Status: DC

## 2012-07-21 NOTE — Assessment & Plan Note (Signed)
Continue pantoprazole. °

## 2012-07-21 NOTE — Progress Notes (Signed)
CC PCP 

## 2012-07-21 NOTE — Progress Notes (Signed)
Primary Care Physician:  Kirstie Peri, MD  Primary Gastroenterologist:  Roetta Sessions, MD   Chief Complaint  Patient presents with  . Colonoscopy    HPI:  Perry Cervantes is a 62 y.o. male here to schedule colonoscopy and possible EGD. He was last seen in 03/2012 for heme positive stools. He had to put off procedures at that time due to his wife's health. No evidence of anemia at that time. At that visit however he was complaining of epigastric burning and reflux symptoms when he would run out of Nexium. He was having trouble affording the Nexium as he was in the "donut hole". We switched him to pantoprazole at that time.  Pantoprazole in AM. Takes Zantac at nighttime depending on evening meal. No dysphagia, abdominal pain, melena, brbpr, constipation, diarrhea. Overall reflux is better. He has gained 10 pounds since his last visit.   Current Outpatient Prescriptions  Medication Sig Dispense Refill  . amLODipine (NORVASC) 10 MG tablet 10 mg daily.      Marland Kitchen aspirin 81 MG tablet Take 81 mg by mouth daily.      . bisoprolol-hydrochlorothiazide (ZIAC) 10-6.25 MG per tablet Take 1 tablet by mouth daily.       . cyclobenzaprine (FLEXERIL) 10 MG tablet Take 10 mg by mouth 2 (two) times daily as needed.       . diclofenac (VOLTAREN) 75 MG EC tablet Take 75 mg by mouth 2 (two) times daily.       Marland Kitchen lisinopril (PRINIVIL,ZESTRIL) 5 MG tablet Take 5 mg by mouth daily.       Marland Kitchen LYRICA 75 MG capsule Take 75 mg by mouth 2 (two) times daily.       . pantoprazole (PROTONIX) 40 MG tablet Take 1 tablet (40 mg total) by mouth daily.  30 tablet  11  . ranitidine (ZANTAC) 150 MG tablet Take 150 mg by mouth 2 (two) times daily.      . traZODone (DESYREL) 50 MG tablet Take 50 mg by mouth at bedtime.        No current facility-administered medications for this visit.    Allergies as of 07/21/2012  . (No Known Allergies)    Past Medical History  Diagnosis Date  . Hypertension   . Gastroesophageal reflux  disease   . Chronic pain     secondary to back surgery  . Mild depression   . Kidney stones     Past Surgical History  Procedure Laterality Date  . Esophagogastroduodenoscopy  03/13/2005    RMR: Small hiatal hernia  . Colonoscopy  03/13/2005    RMR: Normal rectum. Diminutive polyp at hepatic flexure cold biopsied/removed (inflamed, benign). Otherwise normal colon, normal terminal ileum  . Neck surgery  2008  . Back surgery    . Right knee arthroscopy      Family History  Problem Relation Age of Onset  . Heart attack Mother     deceased age 63  . Other Father     deceased age 42 of Staph sepsis  . Colon cancer Neg Hx     History   Social History  . Marital Status: Married    Spouse Name: N/A    Number of Children: 5  . Years of Education: N/A   Occupational History  . retired from city of [place]    Social History Main Topics  . Smoking status: Current Every Day Smoker -- 0.50 packs/day    Types: Cigarettes  . Smokeless tobacco: Not on file  .  Alcohol Use: No  . Drug Use: No  . Sexually Active: Not on file   Other Topics Concern  . Not on file   Social History Narrative  . No narrative on file      ROS:  General: Negative for anorexia, weight loss, fever, chills, fatigue, weakness. Eyes: Negative for vision changes.  ENT: Negative for hoarseness, difficulty swallowing , nasal congestion. CV: Negative for chest pain, angina, palpitations, dyspnea on exertion, peripheral edema.  Respiratory: Negative for dyspnea at rest, dyspnea on exertion, cough, sputum, wheezing.  GI: See history of present illness. GU:  Negative for dysuria, hematuria, urinary incontinence, urinary frequency, nocturnal urination.  MS: Negative for joint pain, low back pain.  Derm: Negative for rash or itching.  Neuro: Negative for weakness, abnormal sensation, seizure, frequent headaches, memory loss, confusion.  Psych: Negative for anxiety, depression, suicidal ideation,  hallucinations.  Endo: Negative for unusual weight change.  Heme: Negative for bruising or bleeding. Allergy: Negative for rash or hives.    Physical Examination:  BP 158/91  Pulse 81  Temp(Src) 97.2 F (36.2 C) (Oral)  Ht 5\' 9"  (1.753 m)  Wt 224 lb 12.8 oz (101.969 kg)  BMI 33.18 kg/m2   General: Well-nourished, well-developed in no acute distress.  Head: Normocephalic, atraumatic.   Eyes: Conjunctiva pink, no icterus. Mouth: Oropharyngeal mucosa moist and pink , no lesions erythema or exudate. Neck: Supple without thyromegaly, masses, or lymphadenopathy.  Lungs: Clear to auscultation bilaterally.  Heart: Regular rate and rhythm, no murmurs rubs or gallops.  Abdomen: Bowel sounds are normal, nontender, nondistended, no hepatosplenomegaly or masses, no abdominal bruits or    hernia , no rebound or guarding.   Rectal: not performed Extremities: No lower extremity edema. No clubbing or deformities.  Neuro: Alert and oriented x 4 , grossly normal neurologically.  Skin: Warm and dry, no rash or jaundice.   Psych: Alert and cooperative, normal mood and affect.   Imaging Studies: No results found.

## 2012-07-21 NOTE — Assessment & Plan Note (Signed)
History of Hemoccult-positive stool without anemia. Patient is now ready to pursue procedures. His reflux symptoms are now better controlled on pantoprazole. He remains on chronic NSAIDs however. It is not clear that he'll need to have an upper endoscopy at this time. We will schedule him for a colonoscopy with possible upper endoscopy based on findings. If Hemoccult-positive stool explained with colonoscopy findings he may not need an upper endoscopy given that his symptoms are now better controlled.  I have discussed the risks, alternatives, benefits with regards to but not limited to the risk of reaction to medication, bleeding, infection, perforation and the patient is agreeable to proceed. Written consent to be obtained.  Recently required high-dose Demerol for sedation. Will augment conscious sedation with Phenergan 25 mg IV 30 minutes before. Discussed with patient and he is agreeable.

## 2012-07-21 NOTE — Patient Instructions (Signed)
We have scheduled you for colonoscopy with possible upper endoscopy with Dr. Jena Gauss. Please see separate instructions.

## 2012-07-22 ENCOUNTER — Other Ambulatory Visit (HOSPITAL_COMMUNITY): Payer: Self-pay | Admitting: Orthopedic Surgery

## 2012-07-22 DIAGNOSIS — M25561 Pain in right knee: Secondary | ICD-10-CM

## 2012-07-25 ENCOUNTER — Ambulatory Visit (HOSPITAL_COMMUNITY)
Admission: RE | Admit: 2012-07-25 | Discharge: 2012-07-25 | Disposition: A | Discharge status: Home / Self Care | Payer: Medicare Other | Source: Ambulatory Visit | Attending: Orthopedic Surgery | Admitting: Orthopedic Surgery

## 2012-07-25 DIAGNOSIS — M25469 Effusion, unspecified knee: Secondary | ICD-10-CM | POA: Insufficient documentation

## 2012-07-25 DIAGNOSIS — M25561 Pain in right knee: Secondary | ICD-10-CM

## 2012-07-25 DIAGNOSIS — M25569 Pain in unspecified knee: Secondary | ICD-10-CM | POA: Insufficient documentation

## 2012-07-25 DIAGNOSIS — M9420 Chondromalacia, unspecified site: Secondary | ICD-10-CM | POA: Insufficient documentation

## 2012-07-28 ENCOUNTER — Ambulatory Visit (HOSPITAL_COMMUNITY)
Admission: RE | Admit: 2012-07-28 | Discharge: 2012-07-28 | Disposition: A | Discharge status: Home / Self Care | Payer: Medicare Other | Source: Ambulatory Visit | Attending: Internal Medicine | Admitting: Internal Medicine

## 2012-07-28 ENCOUNTER — Encounter (HOSPITAL_COMMUNITY)
Admission: RE | Disposition: A | Discharge status: Home / Self Care | Payer: Self-pay | Source: Ambulatory Visit | Attending: Internal Medicine

## 2012-07-28 ENCOUNTER — Encounter (HOSPITAL_COMMUNITY): Payer: Self-pay | Admitting: *Deleted

## 2012-07-28 DIAGNOSIS — D126 Benign neoplasm of colon, unspecified: Secondary | ICD-10-CM | POA: Insufficient documentation

## 2012-07-28 DIAGNOSIS — R195 Other fecal abnormalities: Secondary | ICD-10-CM

## 2012-07-28 DIAGNOSIS — I10 Essential (primary) hypertension: Secondary | ICD-10-CM | POA: Insufficient documentation

## 2012-07-28 DIAGNOSIS — K5730 Diverticulosis of large intestine without perforation or abscess without bleeding: Secondary | ICD-10-CM | POA: Insufficient documentation

## 2012-07-28 DIAGNOSIS — K633 Ulcer of intestine: Secondary | ICD-10-CM

## 2012-07-28 DIAGNOSIS — K219 Gastro-esophageal reflux disease without esophagitis: Secondary | ICD-10-CM

## 2012-07-28 HISTORY — PX: COLONOSCOPY: SHX5424

## 2012-07-28 SURGERY — COLONOSCOPY
Anesthesia: Moderate Sedation

## 2012-07-28 MED ORDER — ONDANSETRON HCL 4 MG/2ML IJ SOLN
INTRAMUSCULAR | Status: AC
  Filled 2012-07-28: qty 2

## 2012-07-28 MED ORDER — BUTAMBEN-TETRACAINE-BENZOCAINE 2-2-14 % EX AERO
INHALATION_SPRAY | CUTANEOUS | Status: DC | PRN
  Administered 2012-07-28: 2 via TOPICAL

## 2012-07-28 MED ORDER — SODIUM CHLORIDE 0.9 % IV SOLN
INTRAVENOUS | Status: DC
  Administered 2012-07-28: 12:00:00 via INTRAVENOUS

## 2012-07-28 MED ORDER — MIDAZOLAM HCL 5 MG/5ML IJ SOLN
INTRAMUSCULAR | Status: DC | PRN
  Administered 2012-07-28: 1 mg via INTRAVENOUS
  Administered 2012-07-28 (×2): 2 mg via INTRAVENOUS

## 2012-07-28 MED ORDER — PROMETHAZINE HCL 25 MG/ML IJ SOLN
25.0000 mg | Freq: Once | INTRAMUSCULAR | Status: AC
  Administered 2012-07-28: 25 mg via INTRAVENOUS

## 2012-07-28 MED ORDER — ONDANSETRON HCL 4 MG/2ML IJ SOLN
INTRAMUSCULAR | Status: DC | PRN
  Administered 2012-07-28: 4 mg via INTRAVENOUS

## 2012-07-28 MED ORDER — MEPERIDINE HCL 100 MG/ML IJ SOLN
INTRAMUSCULAR | Status: DC | PRN
  Administered 2012-07-28: 50 mg via INTRAVENOUS
  Administered 2012-07-28: 25 mg via INTRAVENOUS

## 2012-07-28 MED ORDER — PROMETHAZINE HCL 25 MG/ML IJ SOLN
INTRAMUSCULAR | Status: AC
  Filled 2012-07-28: qty 1

## 2012-07-28 MED ORDER — SODIUM CHLORIDE 0.9 % IJ SOLN
INTRAMUSCULAR | Status: AC
  Filled 2012-07-28: qty 10

## 2012-07-28 MED ORDER — MIDAZOLAM HCL 5 MG/5ML IJ SOLN
INTRAMUSCULAR | Status: AC
  Filled 2012-07-28: qty 10

## 2012-07-28 MED ORDER — STERILE WATER FOR IRRIGATION IR SOLN
Status: DC | PRN
  Administered 2012-07-28: 13:00:00

## 2012-07-28 MED ORDER — MEPERIDINE HCL 100 MG/ML IJ SOLN
INTRAMUSCULAR | Status: AC
  Filled 2012-07-28: qty 2

## 2012-07-28 NOTE — H&P (View-Only) (Signed)
Primary Care Physician:  SHAH,ASHISH, MD  Primary Gastroenterologist:  Michael Rourk, MD   Chief Complaint  Patient presents with  . Colonoscopy    HPI:  Perry Cervantes is a 62 y.o. male here to schedule colonoscopy and possible EGD. He was last seen in 03/2012 for heme positive stools. He had to put off procedures at that time due to his wife's health. No evidence of anemia at that time. At that visit however he was complaining of epigastric burning and reflux symptoms when he would run out of Nexium. He was having trouble affording the Nexium as he was in the "donut hole". We switched him to pantoprazole at that time.  Pantoprazole in AM. Takes Zantac at nighttime depending on evening meal. No dysphagia, abdominal pain, melena, brbpr, constipation, diarrhea. Overall reflux is better. He has gained 10 pounds since his last visit.   Current Outpatient Prescriptions  Medication Sig Dispense Refill  . amLODipine (NORVASC) 10 MG tablet 10 mg daily.      . aspirin 81 MG tablet Take 81 mg by mouth daily.      . bisoprolol-hydrochlorothiazide (ZIAC) 10-6.25 MG per tablet Take 1 tablet by mouth daily.       . cyclobenzaprine (FLEXERIL) 10 MG tablet Take 10 mg by mouth 2 (two) times daily as needed.       . diclofenac (VOLTAREN) 75 MG EC tablet Take 75 mg by mouth 2 (two) times daily.       . lisinopril (PRINIVIL,ZESTRIL) 5 MG tablet Take 5 mg by mouth daily.       . LYRICA 75 MG capsule Take 75 mg by mouth 2 (two) times daily.       . pantoprazole (PROTONIX) 40 MG tablet Take 1 tablet (40 mg total) by mouth daily.  30 tablet  11  . ranitidine (ZANTAC) 150 MG tablet Take 150 mg by mouth 2 (two) times daily.      . traZODone (DESYREL) 50 MG tablet Take 50 mg by mouth at bedtime.        No current facility-administered medications for this visit.    Allergies as of 07/21/2012  . (No Known Allergies)    Past Medical History  Diagnosis Date  . Hypertension   . Gastroesophageal reflux  disease   . Chronic pain     secondary to back surgery  . Mild depression   . Kidney stones     Past Surgical History  Procedure Laterality Date  . Esophagogastroduodenoscopy  03/13/2005    RMR: Small hiatal hernia  . Colonoscopy  03/13/2005    RMR: Normal rectum. Diminutive polyp at hepatic flexure cold biopsied/removed (inflamed, benign). Otherwise normal colon, normal terminal ileum  . Neck surgery  2008  . Back surgery    . Right knee arthroscopy      Family History  Problem Relation Age of Onset  . Heart attack Mother     deceased age 70  . Other Father     deceased age 78 of Staph sepsis  . Colon cancer Neg Hx     History   Social History  . Marital Status: Married    Spouse Name: N/A    Number of Children: 5  . Years of Education: N/A   Occupational History  . retired from [employer]    Social History Main Topics  . Smoking status: Current Every Day Smoker -- 0.50 packs/day    Types: Cigarettes  . Smokeless tobacco: Not on file  .   Alcohol Use: No  . Drug Use: No  . Sexually Active: Not on file   Other Topics Concern  . Not on file   Social History Narrative  . No narrative on file      ROS:  General: Negative for anorexia, weight loss, fever, chills, fatigue, weakness. Eyes: Negative for vision changes.  ENT: Negative for hoarseness, difficulty swallowing , nasal congestion. CV: Negative for chest pain, angina, palpitations, dyspnea on exertion, peripheral edema.  Respiratory: Negative for dyspnea at rest, dyspnea on exertion, cough, sputum, wheezing.  GI: See history of present illness. GU:  Negative for dysuria, hematuria, urinary incontinence, urinary frequency, nocturnal urination.  MS: Negative for joint pain, low back pain.  Derm: Negative for rash or itching.  Neuro: Negative for weakness, abnormal sensation, seizure, frequent headaches, memory loss, confusion.  Psych: Negative for anxiety, depression, suicidal ideation,  hallucinations.  Endo: Negative for unusual weight change.  Heme: Negative for bruising or bleeding. Allergy: Negative for rash or hives.    Physical Examination:  BP 158/91  Pulse 81  Temp(Src) 97.2 F (36.2 C) (Oral)  Ht 5' 9" (1.753 m)  Wt 224 lb 12.8 oz (101.969 kg)  BMI 33.18 kg/m2   General: Well-nourished, well-developed in no acute distress.  Head: Normocephalic, atraumatic.   Eyes: Conjunctiva pink, no icterus. Mouth: Oropharyngeal mucosa moist and pink , no lesions erythema or exudate. Neck: Supple without thyromegaly, masses, or lymphadenopathy.  Lungs: Clear to auscultation bilaterally.  Heart: Regular rate and rhythm, no murmurs rubs or gallops.  Abdomen: Bowel sounds are normal, nontender, nondistended, no hepatosplenomegaly or masses, no abdominal bruits or    hernia , no rebound or guarding.   Rectal: not performed Extremities: No lower extremity edema. No clubbing or deformities.  Neuro: Alert and oriented x 4 , grossly normal neurologically.  Skin: Warm and dry, no rash or jaundice.   Psych: Alert and cooperative, normal mood and affect.   Imaging Studies: No results found.    

## 2012-07-28 NOTE — Op Note (Signed)
[facility] [address] [place], [address]   COLONOSCOPY PROCEDURE REPORT  PATIENT: Perry Cervantes, Perry Cervantes  MR#:         [id number] BIRTHDATE: [birth year] , 62  yrs. old GENDER: Male ENDOSCOPIST: R.  Roetta Sessions, MD FACP FACG REFERRED BY:  Kirstie Peri, M.D. PROCEDURE DATE:  07/28/2012 PROCEDURE:     Ileocolonoscopy with biopsy  INDICATIONS: Hemoccult-positive stool  INFORMED CONSENT:  The risks, benefits, alternatives and imponderables including but not limited to bleeding, perforation as well as the possibility of a missed lesion have been reviewed.  The potential for biopsy, lesion removal, etc. have also been discussed.  Questions have been answered.  All parties agreeable. Please see the history and physical in the medical record for more information.  MEDICATIONS: Versed 5 mg IV and 75 mg doses. Phenergan 25 mg IV and Zofran 4 mg  DESCRIPTION OF PROCEDURE:  After a digital rectal exam was performed, the EC-[id number] ([id number])  colonoscope was advanced from the anus through the rectum and colon to the area of the cecum, ileocecal valve and appendiceal orifice.  The cecum was deeply intubated.  These structures were well-seen and photographed for the record.  From the level of the cecum and ileocecal valve, the scope was slowly and cautiously withdrawn.  The mucosal surfaces were carefully surveyed utilizing scope tip deflection to facilitate fold flattening as needed.  The scope was pulled down into the rectum where a thorough examination including retroflexion was performed.    FINDINGS:  Adequate preparation. Normal rectum. Sigmoid diverticula. (3) diminutive sigmoid polyps. Patient scattered erosions in the ascending and  descending segments with a couple of 2-3 mm  frank ulcers just distal to the ileocecal valve. The distal 10 cm of terminal ileal mucosa appeared entirely normal.  THERAPEUTIC / DIAGNOSTIC MANEUVERS PERFORMED:  Biopsies of  the abnormal ascending and a descending segments taken. Also, the 3 diminutive polyps mentioned above were cold biopsied/removed.  COMPLICATIONS: none  CECAL WITHDRAWAL TIME:  15 minutes  IMPRESSION:  Colonic diverticulosis. Diminutive colonic polyps-removed as described above. Colonic erosion ulceration as described above-status post biopsy. I suspect NSAID insult to his colon rather than inflammatory bowel disease. These findings could easily explain Hemoccult-positive stool. EGD not needed at this time.  RECOMMENDATIONS: continue pantoprazole daily. Minimize/avoid all nonsteroidal agents. Followup on pathology.   _______________________________ eSigned:  R. Roetta Sessions, MD FACP [facility] 07/28/2012 1:39 PM   CC:    PATIENT NAME:  Perry Cervantes, Perry Cervantes MR#: [id number]

## 2012-07-28 NOTE — Interval H&P Note (Signed)
History and Physical Interval Note:  07/28/2012 12:52 PM  Perry Cervantes  has presented today for surgery, with the diagnosis of HEME POSITIVE STOOL  The various methods of treatment have been discussed with the patient and family. After consideration of risks, benefits and other options for treatment, the patient has consented to  Procedure(s) with comments: COLONOSCOPY (N/A) - 12:30 ESOPHAGOGASTRODUODENOSCOPY (EGD) (N/A) as a surgical intervention .  The patient's history has been reviewed, patient examined, no change in status, stable for surgery.  I have reviewed the patient's chart and labs.  Questions were answered to the patient's satisfaction.     Perry Cervantes  Colonoscopy possible EGD to follow per plan. The patient's upper GI tract symptoms have totally resolved now he is on pantoprazole.The risks, benefits, limitations, imponderables and alternatives regarding both EGD and colonoscopy have been reviewed with the patient. Questions have been answered. All parties agreeable.

## 2012-07-31 ENCOUNTER — Encounter: Payer: Self-pay | Admitting: Internal Medicine

## 2012-07-31 ENCOUNTER — Encounter (HOSPITAL_COMMUNITY): Payer: Self-pay | Admitting: Internal Medicine

## 2013-03-09 ENCOUNTER — Other Ambulatory Visit (HOSPITAL_COMMUNITY): Payer: Self-pay | Admitting: Neurosurgery

## 2013-03-09 DIAGNOSIS — M4310 Spondylolisthesis, site unspecified: Secondary | ICD-10-CM

## 2013-03-11 ENCOUNTER — Ambulatory Visit (HOSPITAL_COMMUNITY)
Admission: RE | Admit: 2013-03-11 | Discharge: 2013-03-11 | Disposition: A | Discharge status: Home / Self Care | Payer: Medicare Other | Source: Ambulatory Visit | Attending: Neurosurgery | Admitting: Neurosurgery

## 2013-03-11 DIAGNOSIS — Z981 Arthrodesis status: Secondary | ICD-10-CM | POA: Insufficient documentation

## 2013-03-11 DIAGNOSIS — M545 Low back pain, unspecified: Secondary | ICD-10-CM | POA: Insufficient documentation

## 2013-03-11 DIAGNOSIS — M48061 Spinal stenosis, lumbar region without neurogenic claudication: Secondary | ICD-10-CM | POA: Insufficient documentation

## 2013-03-11 DIAGNOSIS — M5380 Other specified dorsopathies, site unspecified: Secondary | ICD-10-CM | POA: Insufficient documentation

## 2013-03-11 DIAGNOSIS — M4310 Spondylolisthesis, site unspecified: Secondary | ICD-10-CM | POA: Insufficient documentation

## 2013-03-11 MED ORDER — GADOBENATE DIMEGLUMINE 529 MG/ML IV SOLN
10.0000 mL | Freq: Once | INTRAVENOUS | Status: AC | PRN
  Administered 2013-03-11: 10 mL via INTRAVENOUS

## 2013-03-12 LAB — POCT I-STAT CREATININE: Creatinine, Ser: 1.7 mg/dL — ABNORMAL HIGH (ref 0.50–1.35)

## 2013-03-24 ENCOUNTER — Other Ambulatory Visit: Payer: Self-pay | Admitting: Neurosurgery

## 2013-04-06 ENCOUNTER — Other Ambulatory Visit: Payer: Self-pay | Admitting: Gastroenterology

## 2013-04-27 ENCOUNTER — Other Ambulatory Visit (HOSPITAL_COMMUNITY): Payer: Medicare Other

## 2013-04-29 ENCOUNTER — Encounter (HOSPITAL_COMMUNITY): Payer: Self-pay | Admitting: Pharmacy Technician

## 2013-05-07 ENCOUNTER — Encounter (HOSPITAL_COMMUNITY)
Admission: RE | Admit: 2013-05-07 | Discharge: 2013-05-07 | Disposition: A | Discharge status: Home / Self Care | Payer: Medicare Other | Source: Ambulatory Visit | Attending: Anesthesiology | Admitting: Anesthesiology

## 2013-05-07 ENCOUNTER — Encounter (HOSPITAL_COMMUNITY)
Admission: RE | Admit: 2013-05-07 | Discharge: 2013-05-07 | Disposition: A | Discharge status: Home / Self Care | Payer: Medicare Other | Source: Ambulatory Visit | Attending: Neurosurgery | Admitting: Neurosurgery

## 2013-05-07 ENCOUNTER — Encounter (HOSPITAL_COMMUNITY): Payer: Self-pay

## 2013-05-07 DIAGNOSIS — Z01812 Encounter for preprocedural laboratory examination: Secondary | ICD-10-CM | POA: Insufficient documentation

## 2013-05-07 DIAGNOSIS — Z01818 Encounter for other preprocedural examination: Secondary | ICD-10-CM | POA: Insufficient documentation

## 2013-05-07 DIAGNOSIS — Z01810 Encounter for preprocedural cardiovascular examination: Secondary | ICD-10-CM | POA: Insufficient documentation

## 2013-05-07 DIAGNOSIS — Z01811 Encounter for preprocedural respiratory examination: Secondary | ICD-10-CM | POA: Insufficient documentation

## 2013-05-07 HISTORY — DX: Personal history of colon polyps, unspecified: Z86.0100

## 2013-05-07 HISTORY — DX: Fibromyalgia: M79.7

## 2013-05-07 HISTORY — DX: Dorsalgia, unspecified: M54.9

## 2013-05-07 HISTORY — DX: Personal history of colonic polyps: Z86.010

## 2013-05-07 HISTORY — DX: Personal history of urinary calculi: Z87.442

## 2013-05-07 HISTORY — DX: Other chronic pain: G89.29

## 2013-05-07 LAB — BASIC METABOLIC PANEL
BUN: 17 mg/dL (ref 6–23)
CALCIUM: 9.7 mg/dL (ref 8.4–10.5)
CO2: 23 mEq/L (ref 19–32)
CREATININE: 1.12 mg/dL (ref 0.50–1.35)
Chloride: 101 mEq/L (ref 96–112)
GFR calc non Af Amer: 70 mL/min — ABNORMAL LOW (ref >90)
GFR, EST AFRICAN AMERICAN: 81 mL/min — AB (ref >90)
Glucose, Bld: 103 mg/dL — ABNORMAL HIGH (ref 70–99)
Potassium: 4.9 mEq/L (ref 3.7–5.3)
SODIUM: 137 meq/L (ref 137–147)

## 2013-05-07 LAB — TYPE AND SCREEN
ABO/RH(D): O POS
ANTIBODY SCREEN: NEGATIVE

## 2013-05-07 LAB — CBC
HCT: 47.6 % (ref 39.0–52.0)
Hemoglobin: 17.4 g/dL — ABNORMAL HIGH (ref 13.0–17.0)
MCH: 34.6 pg — ABNORMAL HIGH (ref 26.0–34.0)
MCHC: 36.6 g/dL — ABNORMAL HIGH (ref 30.0–36.0)
MCV: 94.6 fL (ref 78.0–100.0)
PLATELETS: 193 10*3/uL (ref 150–400)
RBC: 5.03 MIL/uL (ref 4.22–5.81)
RDW: 13 % (ref 11.5–15.5)
WBC: 10.8 10*3/uL — AB (ref 4.0–10.5)

## 2013-05-07 LAB — ABO/RH: ABO/RH(D): O POS

## 2013-05-07 LAB — SURGICAL PCR SCREEN
MRSA, PCR: NEGATIVE
Staphylococcus aureus: NEGATIVE

## 2013-05-07 NOTE — Progress Notes (Signed)
Pt doesn't have a cardiologist  Denies ever having an echo/stress test/heart cath  Medical Md is Dr.Shah  Denies EKG or CXR in yr

## 2013-05-07 NOTE — Pre-Procedure Instructions (Signed)
Perry Cervantes  05/07/2013   Your procedure is scheduled on:  Thurs, Jan 22 @ 10:15 AM  Report to [facility]  at 7:15 AM.  Call this number if you have problems the morning of surgery: [phone]   Remember:   Do not eat food or drink liquids after midnight.   Take these medicines the morning of surgery with A SIP OF WATER: Amlodipine(Norvasc),Bisoprolol-HCTZ(Ziac),Claritin(Loratadine-if needed),Lyrica(Pregabalin),and Pantoprazole(Protonix)              Stop taking your Aspirin,Ibuprofen,and Diclofenac. No Goody's,BC's,Fish Oil,or any Herbal Medications   Do not wear jewelry  Do not wear lotions, powders, or colognes. You may wear deodorant.  Men may shave face and neck.  Do not bring valuables to the hospital.  [facility] is not responsible                  for any belongings or valuables.               Contacts, dentures or bridgework may not be worn into surgery.  Leave suitcase in the car. After surgery it may be brought to your room.  For patients admitted to the hospital, discharge time is determined by your                treatment team.                   Special Instructions: Shower using CHG 2 nights before surgery and the night before surgery.  If you shower the day of surgery use CHG.  Use special wash - you have one bottle of CHG for all showers.  You should use approximately 1/3 of the bottle for each shower.   Please read over the following fact sheets that you were given: Pain Booklet, Coughing and Deep Breathing, Blood Transfusion Information, MRSA Information and Surgical Site Infection Prevention

## 2013-05-07 NOTE — Progress Notes (Signed)
05/07/13 0957  OBSTRUCTIVE SLEEP APNEA  Have you ever been diagnosed with sleep apnea through a sleep study? No  Do you snore loudly (loud enough to be heard through closed doors)?  0  Do you often feel tired, fatigued, or sleepy during the daytime? 0  Has anyone observed you stop breathing during your sleep? 0  Do you have, or are you being treated for high blood pressure? 1  BMI more than 35 kg/m2? 0  Age over 61 years old? 1  Neck circumference greater than 40 cm/18 inches? 1 (18)  Gender: 1  Obstructive Sleep Apnea Score 4  Score 4 or greater  Results sent to PCP

## 2013-05-08 NOTE — Progress Notes (Addendum)
Anesthesia Chart Review:  Patient is a 63 year old male scheduled for L1-2, L2-3, L3-4 exploration and extension of fusion on 05/14/13 by Dr. Vertell Limber.  History includes smoking, HTN, GERD, fibromyalgia, nephrolithiasis, depression, C5-7 ACDF '08, L4-5 PLIF '05. BMI 31. OSA screening score is 4. PCP is Dr. Monico Blitz.  EKG on 05/07/13 showed NSR, possible anterior infarct (age undetermined). PVC resolved, otherwise I think EKG is overall stable when compared to previous tracing on 04/24/06.    CXR on 05/07/13 showed: No active cardiopulmonary disease. Symmetric nodular opacities expected region of the nipples. Recommend repeat PA chest x-ray with nipple markers. Voice message left for Jessica at Dr. Melven Sartorius office regarding CXR results. (Update 05/11/13 9:35 AM: Janett Billow will review CXR findings with Dr. Vertell Limber.  Defer additional orders to Dr. Vertell Limber.)  Preoperative labs noted.    Anticipate that he can proceed as planned.  Further evaluation by his assigned anesthesiologist on the day of surgery.  George Hugh [facility]/Anesthesiology Phone [phone] 05/08/2013 2:04 PM

## 2013-05-13 MED ORDER — CEFAZOLIN SODIUM-DEXTROSE 2-3 GM-% IV SOLR
2.0000 g | INTRAVENOUS | Status: AC
  Administered 2013-05-14: 2 g via INTRAVENOUS
  Filled 2013-05-13: qty 50

## 2013-05-13 NOTE — H&P (Signed)
Patient ID:                [id number] Patient:                     Perry Cervantes                                   Date of Birth:   [birth year] Visit Type:                Office Visit                                                         Date:   03/23/2013 02:45 PM Provider:                  Marchia Meiers. Vertell Limber MD   This 63 year old male presents for Follow Up of back pain.   HISTORY OF PRESENT ILLNESS: 1.  Follow Up of back pain  Patient has continued to have considerable pain and his MRI shows significant worsening of spinal stenosis, spondylolisthesis, herniated disc, disc degeneration L12, L2-L3, L3-L4 levels.  He has significant retrolisthesis and kyphosis of the thoracolumbar spine from L1-L2, L2-L3, L3-L4 levels.  He is having intractable back pain and bilateral lower extremity pain.  He has lumbar scoliosis.     Medical/Surgical/Interim History Reviewed, no change. Last detailed document date:03/09/2013.     PAST MEDICAL HISTORY, SURGICAL HISTORY, FAMILY HISTORY, SOCIAL HISTORY AND REVIEW OF SYSTEMS I have reviewed the patient's past medical, surgical, family and social history as well as the comprehensive review of systems as included on the [facility] history form dated, which I have signed.   Family History: Reviewed, no changes.  Last detailed document date:03/09/2013.   Social History: Reviewed, no changes. Last detailed document date: 03/09/2013.        MEDICATIONS(added, continued or stopped this visit):   Medication Dose Prescribed Else Ind Started Stopped FLEXERIL 10MG  TABLET 10 mg N 03/27/2013   Lyrica 75 mg capsule 75 mg N 02/10/2013   Percocet 10 mg-325 mg tablet 10 mg-325 mg N 03/09/2013   VOLTAREN 75MG  TABLET EC 75 mg N 03/04/2013       ALLERGIES:     Vitals Date Temp F BP Pulse Ht In Wt Lb BMI BSA Pain Score 03/23/2013   144/82 86 70 214 30.71   5/10         DIAGNOSTIC  RESULTS Diagnostic report text CLINICAL DATA: 63 year old male with history of back pain and lumbar fusion. Acquired spondylolisthesis. Initial encounter.  EXAM: MRI LUMBAR SPINE WITHOUT AND WITH CONTRAST  TECHNIQUE: Multiplanar and multiecho pulse sequences of the lumbar spine were obtained without and with intravenous contrast.  CONTRAST: 43mL MULTIHANCE GADOBENATE DIMEGLUMINE 529 MG/ML IV SOLN  COMPARISON: [facility] lumbar radiographs 03/09/2013. Lumbar MRI 12/30/2006.  FINDINGS: Same numbering system as on the 2008 comparison. Chronic posterior and interbody fusions sequelae at L4-L5. Disc space loss and progressed multilevel retrolisthesis above the fusion, L1-L2, L2-L3, and L3-L4. No marrow edema or evidence of acute osseous abnormality.  Visualized lower thoracic spinal cord is normal with conus medularis at T12-L1. No abnormal intradural enhancement.  Visualized paraspinal soft tissues are within normal  limits. Parapelvic left renal cysts suspected. Otherwise negative visualized abdominal viscera.  T11-T12: Negative.  T12-L1: Negative.  L1-L2: Progressed disc space loss and circumferential disc osteophyte complex. Borderline to mild spinal, lateral recess, and biforaminal stenosis.  L2-L3: Progressed, now severe disc space loss with bulky circumferential disc osteophyte complex. Chronic facet hypertrophy. Less fluid in the facet joints. Increased multifactorial spinal stenosis, now moderate to severe (series 7, image 8 versus previous series 3, image 6). Moderate left and mild right L2 foraminal stenosis also appears increased.  L3-L4: Interval disc desiccation, disc space loss, and right eccentric circumferential disc bulge. Progressed and severe ligament flavum hypertrophy. Stable tip progressed moderate facet hypertrophy. Moderate to severe multifactorial spinal stenosis (series 5, image 24).  L4-L5: Stable sequelae of fusion and  decompression.  L5-S1: Stable, normal disc. Moderate facet hypertrophy is stable. No stenosis.  IMPRESSION: 1. Progressed disc and endplate degeneration from L1-L2 to L3-L4 since 2008 and associated with increased mild spondylolisthesis at each level.  2. Subsequent multifactorial moderate severe spinal stenosis now at L2-L3 and L3-L4. Mild stenosis at L1-L2.  3. Stable postoperative appearance of L4-L5 with no adverse features. Stable normal disc at L5-S1.   Electronically Signed By: Augusto Gamble M.D. On: 03/11/2013 14:47       IMPRESSION Based on the progressive nature of the degenerative changes in Mr. Hawkins spine as well as kyphoscoliosis and severe stenosis with spondylolisthesis, I have recommended he undergo surgery.  This will consist of exploration of the L4-L5 fusion with L1-L5 decompression and fusion.  Patient has been fitted for large LSO brace.  Risks and benefits were discussed in detail with the patient and he wishes to proceed.   Assessment/Plan # Detail Type Description 1. Assessment Acquired spondylolisthesis (738.4).       2. Assessment Lumbar spondylosis (721.3).       3. Assessment Lumbar scoliosis (737.30).       4. Assessment Lumbar radiculopathy (724.4).       5. Assessment Lumbago (724.2).           Pain Assessment/Treatment Pain Scale: 4/10. Method: Numeric Pain Intensity Scale. Pain Assessment/Treatment follow-up plan of care: patient currently takes a pain medication.   Surgery is scheduled for 04/30/2013.   Orders: Diagnostic Procedures: Assessment Procedure 738.4 Exploration and Extension of Fusion - L1-L2 - L2-L3 - L3-L4                Provider:  Danae Orleans. Venetia Maxon MD  03/28/2013 02:11 PM Dictation edited by: Danae Orleans. Venetia Maxon         Patient ID:                [id number] Patient:                     Perry Cervantes                                   Date of Birth:   1951-03-07 Visit Type:                 Office Visit                                                         Date:  03/09/2013 11:15 AM Provider:                  Danae Orleans. Venetia Maxon   This 63 year old male presents for back pain.   HISTORY OF PRESENT ILLNESS: 1.  back pain  Mr. Juanetta Gosling returns after having right knee surgery by Dr. Thurston Hole.  He fears he has injured his low back by altering his gait since his surgery.  He reports lumbar pain with ambulation, having to rest after walking 50 feet.   Lyrica  BID Flexeril  TID prn Diclofenac  QD- BID   Pain Rx post-op knee Sx only - none refilled   X-ray on [facility]     Patient had right knee meniscus repair but as noted significantly increased low back pain over last 4 weeks.  He complains of burning into both of his thighs.  He has difficulty standing for any length of time and says the pain is very severe.   He appears to have full strength on confrontational testing in both lower extremities.   He is gaining weight and is now 5 feet 10 212 pounds.  I advised him that he needs to lose in excess of 30 pounds.         PAST MEDICAL/SURGICAL HISTORY  (Reviewed, updated)           PAST MEDICAL HISTORY, SURGICAL HISTORY, FAMILY HISTORY, SOCIAL HISTORY AND REVIEW OF SYSTEMS I have reviewed the patient's past medical, surgical, family and social history as well as the comprehensive review of systems as included on the [facility] history form dated, which I have signed.   Family History  (Reviewed, updated)   SOCIAL HISTORY  (Reviewed, updated) Tobacco use reviewed. Preferred language is Unknown.  Smoking status: Heavy tobacco smoker.   SMOKING STATUS Use Status Type Smoking Status Usage Per Day Years Used Total Pack Years yes Cigarette Heavy tobacco smoker 10 Cigarettes       TOBACCO CESSATION INFORMATION Date Counseled By Order Status Description Code Tobacco Cessation Information 03/09/2013            Smoking cessation education                 MEDICATIONS(added, continued or stopped this visit):   Medication Dose Prescribed Else Ind Started Stopped FLEXERIL  TABLET 10 mg N 02/17/2013   Lyrica 75 mg capsule 75 mg N 02/10/2013   Percocet 10 mg-325 mg tablet 10 mg-325 mg N 03/09/2013   VOLTAREN  TABLET EC 75 mg N 03/04/2013       ALLERGIES:     Vitals Date Temp F BP Pulse Ht In Wt Lb BMI BSA Pain Score 03/09/2013   162/94 97 70 214 30.71   8/10         DIAGNOSTIC RESULTS Lumbar radiographs demonstrate solid arthrodesis and will position hardware L4-L5 level.  He has severe spondylosis and retrolisthesis most severe at L2-L3 but also L3-L4 and L4-L1-L2 levels.  Additionally he has dextral convex scoliosis of the upper lumbar spine.       IMPRESSION Progressive lumbar spondylosis now with scoliosis and lumbar radiculopathy.   Completed Orders (this encounter) Order Details Reason Side Interpretation Result Initial Treatment Date Region Lumbar Spine- AP/Lat/Flex/Ex           03/09/2013 All Levels to All Levels   Assessment/Plan # Detail Type Description 1. Assessment Lumbago (724.2).       2. Assessment Acquired spondylolisthesis (738.4).  3. Assessment Lumbar scoliosis (737.30).       4. Assessment Lumbar spondylosis (721.3).       5. Assessment Lumbar radiculopathy (724.4).           Pain Assessment/Treatment Pain Scale: 8/10. Method: Numeric Pain Intensity Scale. Pain Assessment/Treatment follow-up plan of care: patient currently taking flexeril.   MRI of the lumbar spine with and without gadolinium with followup to me.  Prescription for pain medication was written   Orders: Diagnostic Procedures: Assessment Procedure   Lumbar Spine- AP/Lat/Flex/Ex 738.4 MRI Spine/lumb With & W/o Contrast     MEDICATIONS PRESCRIBED TODAY       Rx  Quantity Refills PERCOCET 10 mg-325 mg   60 0              Provider:  Marchia Meiers. Vertell Limber  03/09/2013 12:14 PM Dictation edited by: Marchia Meiers. Vertell Limber         OUTPATIENT OFFICE NOTE   August 06, 2012 Patient Name:                   ALEXAVIER ROQUEMORE         [id number] Date of Birth:                     10/20/1950     HOPI:                                                    Shriram Jin returns today.  He fell last week and had a tear in his meniscus on the right knee.  He saw the PA from Dr. Archie Endo office who suggested he might need a knee replacement.  He is otherwise doing well.  Has lost about 45 pounds and is continuing to work on that.                   IMPRESSION/PLAN:                        I am pleased with his progress with regard to his low back.  He is not having a lot of back problems at the present time.  He is going to follow up with me on an as-needed basis.                     Marchia Meiers. Vertell Limber, MD   NEUROSURGICAL CONSULTATION     Lyla Glassing, Brooke Bonito.                                 [id number]                                    June 26, 2002   HISTORY:  I last saw Jaydrien Wassenaar in my office on 08-11-1999.  At that point he was having some back and leg discomfort including right lower extremity pain and appeared to have resolved.  He then said that he was having some periodic pain, but then after I released him in June of 2001 he developed significant right lower extremity pain after he moved 500 bales of straw.  He said he grabbed one bale of straw and felt pain and he was out of work for one week and then at that point had more persistent pain down his right leg.  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 He describes a very frustrating experience where he was initially referred by [employer]. to a physician in [place] and then subsequently saw Dr. Louanne Skye who put him in physical therapy for two months, which did not help, and said there was nothing more he could do for him.  [name] is now out on disability retirement.  He says that he is still having a lot of pain and burning to his knee and to his inner ankle particularly on the right side. He says at times when he bends over his back will lock up on him.  He has noted some left leg pain, but says his right leg is much worse.  He also has increased pain with riding. He does not note any problems going into his foot.  He does note weakness. He denies any bowel or bladder dysfunction.  He notes that his legs will giveaway on him if he has been standing for more than 15 or 20 minutes.  He has been taking Percocet one every six hours as needed for pain and Prednisone Dosepak and he is on day 11 of 12, Valium every few days for pain and stiffness, Zanaflex up to three times daily.  He had two months of physical therapy over one year ago.  He had two epidural steroid injections; the first one did not have give him any relief, the second gave him some relief for two weeks.    REVIEW OF SYSTEMS:                                     A detailed Review of Systems sheet was reviewed with the patient.  Under ears, nose, mouth and throat - he notes nasal congestion and drainage, sinus problems; under cardiovascular - he notes high blood pressure and leg pain with walking; under respiratory - he notes chronic cough; under gastrointestinal - he notes indigestion or pain with eating; under musculoskeletal - he notes leg weakness, back pain and leg pain; otherwise unremarkable.  All other systems are negative; this includes Constitutional symptoms, Eyes, Genitourinary, Integumentary & Breast, Neurologic, Psychiatric, Hematologic/Lymphatic,  Allergic/Immunologic.   PAST MEDICAL HISTORY:                                         Current Medical Conditions:  Significant for hypertension.            Prior Operations and Hospitalizations:            He has had no past surgeries.             Medications and Allergies:                   Current medications include Endocet one tablet every 6 hours as needed for pain, Zyrtec 10 mg. daily, Ziac 5/6.25 mg. daily, Norvasc 5 mg. daily, Zanaflex 4 mg. three times daily, Nexium 40 mg. bid, Prednisone 5 mg. Dosepak and Diazepam as needed.             Height and Weight:                                                 He notes that he is 5'9 " tall, 209 lbs.    SOCIAL HISTORY:                                                              He is a one-pack per day smoker.  He is a social drinker of alcoholic beverages.  No history of substance abuse.                           DIAGNOSTIC STUDIES:                                   Recent studies include an MRI of the lumbar spine, which was performed on 06-17-2002 and this demonstrates advanced disc degenerative disease at the L1-2 level with circumferential disc protrusion with encroachment on the right L1 nerve root.  Additionally, there is foraminal stenosis at L4-5 and there appears to be a shallow far lateral disc herniation at the L4-5 level on the right, which appears to be causing extraforaminal nerve root encroachment.    PHYSICAL EXAMINATION:                                       General Appearance:                                             On examination today, [name] is a pleasant, cooperative, obese, white male in no acute distress.             Blood Pressure, Pulse:                                           Heart rate is 84.  Blood pressure is 140/70 in the left arm seated.             HEENT - normocephalic, atraumatic.  The  pupils are equal, round and reactive to light.  The extraocular muscles are  intact.  Sclerae - white.  Conjunctiva - pink.  Oropharynx benign.  Uvula midline.            Neck - there are no masses, meningismus, deformities, tracheal deviation, jugular vein distention or carotid bruits.  There is normal cervical range of motion.  Spurlings' test is negative without reproducible radicular pain turning the patient's head to either side.  Lhermitte's sign is not present with axial compression.            Respiratory - there is normal respiratory effort with good intercostal function.  Lungs are clear to auscultation.  There are no rales, rhonchi or wheezes.             Cardiovascular - the heart has regular rate and rhythm to auscultation.  No murmurs are appreciated.  There is no extremity edema, cyanosis or clubbing.  There are palpable pedal pulses.             Abdomen - soft, nontender, no hepatosplenomegaly appreciated or masses.  There are active bowel sounds.  No guarding or rebound.             Musculoskeletal Examination - the patient is able to walk about the examining room with a normal heel and toe gait, although he has an antalgic gait favoring his right lower extremity.  He has right paraspinous discomfort and right sciatic notch discomfort, no significant left sciatic notch discomfort.  He is able to squat on either leg independently, but has some slightly increased difficulty on the right compared to the left.  Straight leg raise is positive at 40 degrees on the right and negative on the left.   NEUROLOGICAL EXAMINATION:               The patient is oriented to time, person and place and has good recall of both recent and remote memory with normal attention span and concentration.  The patient speaks with clear and fluent speech and exhibits normal language function and appropriate fund of knowledge.             Cranial Nerve Examination - pupils are equal, round and reactive to light.  Extraocular movements are full.  Visual fields are full to  confrontational testing.  Facial sensation and facial movement are symmetric and intact.  Hearing is intact to finger rub.  Palate is upgoing.  Shoulder shrug is symmetric.  Tongue protrudes in the midline.             Motor Examination - motor strength is 5/5 in the bilateral deltoids, biceps, triceps, handgrips, wrist extensors, interosseous.  In the lower extremities motor strength is 5/5 in hip flexion, extension, quadriceps, hamstrings, plantar flexion, dorsiflexion and extensor hallucis longus.             Sensory Examination - normal to light touch and pinprick sensation in the upper and lower extremities, with a slight decreased pin sensation in the L5 distribution on the right.             Deep Tendon Reflexes - 2 in the biceps, triceps, and brachioradialis, 2 in the knees, 2 in the ankles.  The great toes are downgoing to plantar stimulation.              Cerebellar Examination - normal coordination in upper and lower extremities and normal rapid alternating movements.  Romberg test is negative.   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  I have suggested that he have a selective nerve root block of the L4 nerve root on the right to see if this relieves his pain.  If this is the basis for his pain and the pain returns, then I think an extraforaminal discectomy could be useful to him.  I do not think that the L1-2 degenerative process is a significant contributor to his current pain complaints.  I will make further recommendations after he undergoes the nerve root block.   [facility]       Marchia Meiers. Vertell Limber, M.D.

## 2013-05-14 ENCOUNTER — Encounter (HOSPITAL_COMMUNITY)
Admission: RE | Disposition: A | Discharge status: Home / Self Care | Payer: Medicare Other | Source: Ambulatory Visit | Attending: Neurosurgery

## 2013-05-14 ENCOUNTER — Inpatient Hospital Stay (HOSPITAL_COMMUNITY): Payer: Medicare Other

## 2013-05-14 ENCOUNTER — Encounter (HOSPITAL_COMMUNITY): Payer: Self-pay | Admitting: Surgery

## 2013-05-14 ENCOUNTER — Inpatient Hospital Stay (HOSPITAL_COMMUNITY): Payer: Medicare Other | Admitting: Anesthesiology

## 2013-05-14 ENCOUNTER — Inpatient Hospital Stay (HOSPITAL_COMMUNITY)
Admission: RE | Admit: 2013-05-14 | Discharge: 2013-05-17 | DRG: 460 | Disposition: A | Discharge status: Home / Self Care | Payer: Medicare Other | Source: Ambulatory Visit | Attending: Neurosurgery | Admitting: Neurosurgery

## 2013-05-14 ENCOUNTER — Encounter (HOSPITAL_COMMUNITY): Payer: Medicare Other | Admitting: Vascular Surgery

## 2013-05-14 DIAGNOSIS — Z01811 Encounter for preprocedural respiratory examination: Secondary | ICD-10-CM

## 2013-05-14 DIAGNOSIS — Z01812 Encounter for preprocedural laboratory examination: Secondary | ICD-10-CM

## 2013-05-14 DIAGNOSIS — M419 Scoliosis, unspecified: Secondary | ICD-10-CM | POA: Diagnosis present

## 2013-05-14 DIAGNOSIS — Z79899 Other long term (current) drug therapy: Secondary | ICD-10-CM

## 2013-05-14 DIAGNOSIS — M4310 Spondylolisthesis, site unspecified: Secondary | ICD-10-CM | POA: Diagnosis present

## 2013-05-14 DIAGNOSIS — Z01818 Encounter for other preprocedural examination: Secondary | ICD-10-CM

## 2013-05-14 DIAGNOSIS — F17200 Nicotine dependence, unspecified, uncomplicated: Secondary | ICD-10-CM | POA: Diagnosis present

## 2013-05-14 DIAGNOSIS — M47817 Spondylosis without myelopathy or radiculopathy, lumbosacral region: Principal | ICD-10-CM | POA: Diagnosis present

## 2013-05-14 DIAGNOSIS — I10 Essential (primary) hypertension: Secondary | ICD-10-CM | POA: Diagnosis present

## 2013-05-14 DIAGNOSIS — IMO0002 Reserved for concepts with insufficient information to code with codable children: Secondary | ICD-10-CM | POA: Diagnosis present

## 2013-05-14 DIAGNOSIS — M5126 Other intervertebral disc displacement, lumbar region: Secondary | ICD-10-CM | POA: Diagnosis present

## 2013-05-14 DIAGNOSIS — Z01810 Encounter for preprocedural cardiovascular examination: Secondary | ICD-10-CM

## 2013-05-14 DIAGNOSIS — Z981 Arthrodesis status: Secondary | ICD-10-CM

## 2013-05-14 DIAGNOSIS — M4000 Postural kyphosis, site unspecified: Secondary | ICD-10-CM | POA: Diagnosis present

## 2013-05-14 DIAGNOSIS — M4120 Other idiopathic scoliosis, site unspecified: Secondary | ICD-10-CM | POA: Diagnosis present

## 2013-05-14 HISTORY — DX: Spondylolysis, lumbar region: M43.06

## 2013-05-14 SURGERY — POSTERIOR LUMBAR FUSION 3 WITH HARDWARE REMOVAL
Anesthesia: General | Site: Back

## 2013-05-14 MED ORDER — DOCUSATE SODIUM 100 MG PO CAPS
100.0000 mg | ORAL_CAPSULE | Freq: Two times a day (BID) | ORAL | Status: DC
  Administered 2013-05-14 – 2013-05-17 (×6): 100 mg via ORAL
  Filled 2013-05-14 (×5): qty 1

## 2013-05-14 MED ORDER — ALUM & MAG HYDROXIDE-SIMETH 200-200-20 MG/5ML PO SUSP: 30.0000 mL | Freq: Four times a day (QID) | ORAL | Status: DC | PRN

## 2013-05-14 MED ORDER — MIDAZOLAM HCL 5 MG/5ML IJ SOLN
INTRAMUSCULAR | Status: DC | PRN
  Administered 2013-05-14: 2 mg via INTRAVENOUS

## 2013-05-14 MED ORDER — KCL IN DEXTROSE-NACL 20-5-0.45 MEQ/L-%-% IV SOLN
INTRAVENOUS | Status: DC
  Administered 2013-05-14: 1000 mL via INTRAVENOUS
  Filled 2013-05-14 (×6): qty 1000

## 2013-05-14 MED ORDER — OXYCODONE-ACETAMINOPHEN 5-325 MG PO TABS
1.0000 | ORAL_TABLET | ORAL | Status: DC | PRN
  Administered 2013-05-15: 2 via ORAL
  Filled 2013-05-14: qty 2
  Filled 2013-05-14: qty 1

## 2013-05-14 MED ORDER — GLYCOPYRROLATE 0.2 MG/ML IJ SOLN
INTRAMUSCULAR | Status: AC
  Filled 2013-05-14: qty 4

## 2013-05-14 MED ORDER — LACTATED RINGERS IV SOLN
INTRAVENOUS | Status: DC
  Administered 2013-05-14: 08:00:00 via INTRAVENOUS

## 2013-05-14 MED ORDER — PREGABALIN 75 MG PO CAPS
75.0000 mg | ORAL_CAPSULE | Freq: Two times a day (BID) | ORAL | Status: DC
  Administered 2013-05-14 – 2013-05-17 (×6): 75 mg via ORAL
  Filled 2013-05-14 (×6): qty 1

## 2013-05-14 MED ORDER — STERILE WATER FOR INJECTION IJ SOLN
INTRAMUSCULAR | Status: AC
Start: 2013-05-14 — End: 2013-05-14
  Filled 2013-05-14: qty 10

## 2013-05-14 MED ORDER — NEOSTIGMINE METHYLSULFATE 1 MG/ML IJ SOLN
INTRAMUSCULAR | Status: AC
Start: 2013-05-14 — End: 2013-05-14
  Filled 2013-05-14: qty 10

## 2013-05-14 MED ORDER — FENTANYL CITRATE 0.05 MG/ML IJ SOLN
INTRAMUSCULAR | Status: AC
  Filled 2013-05-14: qty 5

## 2013-05-14 MED ORDER — BISOPROLOL-HYDROCHLOROTHIAZIDE 10-6.25 MG PO TABS: 1.0000 | ORAL_TABLET | Freq: Every day | ORAL | Status: DC

## 2013-05-14 MED ORDER — SODIUM CHLORIDE 0.9 % IJ SOLN: 3.0000 mL | INTRAMUSCULAR | Status: DC | PRN

## 2013-05-14 MED ORDER — NEOSTIGMINE METHYLSULFATE 1 MG/ML IJ SOLN
INTRAMUSCULAR | Status: DC | PRN
  Administered 2013-05-14: 4 mg via INTRAVENOUS

## 2013-05-14 MED ORDER — OXYCODONE HCL 5 MG PO TABS
5.0000 mg | ORAL_TABLET | Freq: Once | ORAL | Status: DC | PRN
Start: 2013-05-14 — End: 2013-05-14
  Administered 2013-05-14: 5 mg via ORAL

## 2013-05-14 MED ORDER — BUPIVACAINE HCL (PF) 0.5 % IJ SOLN
INTRAMUSCULAR | Status: DC | PRN
Start: 2013-05-14 — End: 2013-05-14
  Administered 2013-05-14: 8 mL

## 2013-05-14 MED ORDER — EPHEDRINE SULFATE 50 MG/ML IJ SOLN
INTRAMUSCULAR | Status: AC
  Filled 2013-05-14: qty 1

## 2013-05-14 MED ORDER — ONDANSETRON HCL 4 MG/2ML IJ SOLN: 4.0000 mg | Freq: Once | INTRAMUSCULAR | Status: DC | PRN

## 2013-05-14 MED ORDER — SODIUM CHLORIDE 0.9 % IJ SOLN
3.0000 mL | Freq: Two times a day (BID) | INTRAMUSCULAR | Status: DC
  Administered 2013-05-15 – 2013-05-17 (×4): 3 mL via INTRAVENOUS

## 2013-05-14 MED ORDER — HYDROCODONE-ACETAMINOPHEN 5-325 MG PO TABS
1.0000 | ORAL_TABLET | ORAL | Status: DC | PRN
Start: 2013-05-14 — End: 2013-05-17
  Administered 2013-05-15 – 2013-05-17 (×4): 2 via ORAL
  Filled 2013-05-14 (×4): qty 2

## 2013-05-14 MED ORDER — HYDROMORPHONE HCL PF 1 MG/ML IJ SOLN
0.2500 mg | INTRAMUSCULAR | Status: DC | PRN
Start: 2013-05-14 — End: 2013-05-14
  Administered 2013-05-14 (×4): 0.5 mg via INTRAVENOUS

## 2013-05-14 MED ORDER — OXYCODONE-ACETAMINOPHEN 10-325 MG PO TABS: 1.0000 | ORAL_TABLET | ORAL | Status: DC | PRN

## 2013-05-14 MED ORDER — FENTANYL CITRATE 0.05 MG/ML IJ SOLN
INTRAMUSCULAR | Status: DC | PRN
  Administered 2013-05-14: 50 ug via INTRAVENOUS
  Administered 2013-05-14: 100 ug via INTRAVENOUS
  Administered 2013-05-14: 50 ug via INTRAVENOUS
  Administered 2013-05-14: 100 ug via INTRAVENOUS
  Administered 2013-05-14: 50 ug via INTRAVENOUS
  Administered 2013-05-14: 100 ug via INTRAVENOUS
  Administered 2013-05-14 (×5): 50 ug via INTRAVENOUS
  Administered 2013-05-14 (×2): 100 ug via INTRAVENOUS
  Administered 2013-05-14 (×2): 50 ug via INTRAVENOUS
  Administered 2013-05-14: 100 ug via INTRAVENOUS
  Administered 2013-05-14: 150 ug via INTRAVENOUS

## 2013-05-14 MED ORDER — MENTHOL 3 MG MT LOZG: 1.0000 | LOZENGE | OROMUCOSAL | Status: DC | PRN

## 2013-05-14 MED ORDER — 0.9 % SODIUM CHLORIDE (POUR BTL) OPTIME
TOPICAL | Status: DC | PRN
  Administered 2013-05-14 (×2): 1000 mL

## 2013-05-14 MED ORDER — CYCLOBENZAPRINE HCL 10 MG PO TABS
10.0000 mg | ORAL_TABLET | Freq: Two times a day (BID) | ORAL | Status: DC | PRN
  Administered 2013-05-15 – 2013-05-17 (×3): 10 mg via ORAL
  Filled 2013-05-14 (×3): qty 1

## 2013-05-14 MED ORDER — BISACODYL 10 MG RE SUPP
10.0000 mg | Freq: Every day | RECTAL | Status: DC | PRN
  Administered 2013-05-16: 10 mg via RECTAL
  Filled 2013-05-14: qty 1

## 2013-05-14 MED ORDER — ACETAMINOPHEN 325 MG PO TABS
650.0000 mg | ORAL_TABLET | ORAL | Status: DC | PRN
Start: 2013-05-14 — End: 2013-05-17

## 2013-05-14 MED ORDER — FAMOTIDINE 10 MG PO TABS
10.0000 mg | ORAL_TABLET | Freq: Every day | ORAL | Status: DC
  Administered 2013-05-15 – 2013-05-17 (×3): 10 mg via ORAL
  Filled 2013-05-14 (×3): qty 1

## 2013-05-14 MED ORDER — CEFAZOLIN SODIUM 1-5 GM-% IV SOLN
1.0000 g | Freq: Three times a day (TID) | INTRAVENOUS | Status: AC
  Administered 2013-05-14 – 2013-05-15 (×2): 1 g via INTRAVENOUS
  Filled 2013-05-14 (×3): qty 50

## 2013-05-14 MED ORDER — BISOPROLOL-HYDROCHLOROTHIAZIDE 10-6.25 MG PO TABS
1.0000 | ORAL_TABLET | Freq: Every day | ORAL | Status: DC
  Administered 2013-05-15 – 2013-05-17 (×3): 1 via ORAL
  Filled 2013-05-14 (×3): qty 1

## 2013-05-14 MED ORDER — ONDANSETRON HCL 4 MG/2ML IJ SOLN
INTRAMUSCULAR | Status: AC
  Filled 2013-05-14: qty 2

## 2013-05-14 MED ORDER — DICLOFENAC SODIUM 75 MG PO TBEC
75.0000 mg | DELAYED_RELEASE_TABLET | Freq: Two times a day (BID) | ORAL | Status: DC
  Administered 2013-05-14: 75 mg via ORAL
  Filled 2013-05-14 (×3): qty 1

## 2013-05-14 MED ORDER — SODIUM CHLORIDE 0.9 % IV SOLN: 250.0000 mL | INTRAVENOUS | Status: DC

## 2013-05-14 MED ORDER — LIDOCAINE HCL (CARDIAC) 20 MG/ML IV SOLN
INTRAVENOUS | Status: DC | PRN
  Administered 2013-05-14: 40 mg via INTRAVENOUS

## 2013-05-14 MED ORDER — FLEET ENEMA 7-19 GM/118ML RE ENEM: 1.0000 | ENEMA | Freq: Once | RECTAL | Status: AC | PRN

## 2013-05-14 MED ORDER — HYDROMORPHONE HCL PF 1 MG/ML IJ SOLN
INTRAMUSCULAR | Status: AC
  Filled 2013-05-14: qty 1

## 2013-05-14 MED ORDER — GLYCOPYRROLATE 0.2 MG/ML IJ SOLN
INTRAMUSCULAR | Status: DC | PRN
  Administered 2013-05-14: .8 mg via INTRAVENOUS

## 2013-05-14 MED ORDER — PHENOL 1.4 % MT LIQD: 1.0000 | OROMUCOSAL | Status: DC | PRN

## 2013-05-14 MED ORDER — MIDAZOLAM HCL 2 MG/2ML IJ SOLN
INTRAMUSCULAR | Status: AC
  Filled 2013-05-14: qty 2

## 2013-05-14 MED ORDER — DIAZEPAM 5 MG PO TABS
ORAL_TABLET | ORAL | Status: AC
  Filled 2013-05-14: qty 1

## 2013-05-14 MED ORDER — ALBUMIN HUMAN 5 % IV SOLN
INTRAVENOUS | Status: DC | PRN
  Administered 2013-05-14 (×2): via INTRAVENOUS

## 2013-05-14 MED ORDER — DIAZEPAM 5 MG PO TABS
5.0000 mg | ORAL_TABLET | Freq: Four times a day (QID) | ORAL | Status: DC | PRN
  Administered 2013-05-14 – 2013-05-17 (×4): 5 mg via ORAL
  Filled 2013-05-14 (×4): qty 1

## 2013-05-14 MED ORDER — AMLODIPINE BESYLATE 10 MG PO TABS
10.0000 mg | ORAL_TABLET | Freq: Every day | ORAL | Status: DC
  Administered 2013-05-15 – 2013-05-17 (×3): 10 mg via ORAL
  Filled 2013-05-14 (×3): qty 1

## 2013-05-14 MED ORDER — PANTOPRAZOLE SODIUM 20 MG PO TBEC
20.0000 mg | DELAYED_RELEASE_TABLET | Freq: Every day | ORAL | Status: DC
  Administered 2013-05-15 – 2013-05-16 (×2): 20 mg via ORAL
  Filled 2013-05-14 (×3): qty 1

## 2013-05-14 MED ORDER — LISINOPRIL 5 MG PO TABS
5.0000 mg | ORAL_TABLET | Freq: Every day | ORAL | Status: DC
  Administered 2013-05-15 – 2013-05-17 (×3): 5 mg via ORAL
  Filled 2013-05-14 (×3): qty 1

## 2013-05-14 MED ORDER — MORPHINE SULFATE 2 MG/ML IJ SOLN
1.0000 mg | INTRAMUSCULAR | Status: DC | PRN
  Administered 2013-05-14 – 2013-05-16 (×6): 4 mg via INTRAVENOUS
  Filled 2013-05-14 (×6): qty 2

## 2013-05-14 MED ORDER — OXYCODONE HCL 5 MG PO TABS
ORAL_TABLET | ORAL | Status: AC
  Filled 2013-05-14: qty 1

## 2013-05-14 MED ORDER — ONDANSETRON HCL 4 MG/2ML IJ SOLN: 4.0000 mg | INTRAMUSCULAR | Status: DC | PRN

## 2013-05-14 MED ORDER — OXYCODONE HCL 5 MG PO TABS
5.0000 mg | ORAL_TABLET | ORAL | Status: DC | PRN
  Administered 2013-05-16 – 2013-05-17 (×3): 5 mg via ORAL
  Filled 2013-05-14 (×4): qty 1

## 2013-05-14 MED ORDER — PHENYLEPHRINE HCL 10 MG/ML IJ SOLN
INTRAMUSCULAR | Status: AC
Start: 2013-05-14 — End: 2013-05-14
  Filled 2013-05-14: qty 1

## 2013-05-14 MED ORDER — ROCURONIUM BROMIDE 100 MG/10ML IV SOLN
INTRAVENOUS | Status: DC | PRN
  Administered 2013-05-14 (×2): 20 mg via INTRAVENOUS
  Administered 2013-05-14: 30 mg via INTRAVENOUS
  Administered 2013-05-14: 20 mg via INTRAVENOUS
  Administered 2013-05-14: 50 mg via INTRAVENOUS

## 2013-05-14 MED ORDER — OXYCODONE HCL 5 MG/5ML PO SOLN: 5.0000 mg | Freq: Once | ORAL | Status: DC | PRN

## 2013-05-14 MED ORDER — ZOLPIDEM TARTRATE 5 MG PO TABS: 5.0000 mg | ORAL_TABLET | Freq: Every evening | ORAL | Status: DC | PRN

## 2013-05-14 MED ORDER — POLYETHYLENE GLYCOL 3350 17 G PO PACK
17.0000 g | PACK | Freq: Every day | ORAL | Status: DC | PRN
  Filled 2013-05-14: qty 1

## 2013-05-14 MED ORDER — LIDOCAINE-EPINEPHRINE 1 %-1:100000 IJ SOLN
INTRAMUSCULAR | Status: DC | PRN
  Administered 2013-05-14: 8 mL via INTRADERMAL

## 2013-05-14 MED ORDER — LACTATED RINGERS IV SOLN
INTRAVENOUS | Status: DC | PRN
  Administered 2013-05-14 (×4): via INTRAVENOUS

## 2013-05-14 MED ORDER — OXYCODONE-ACETAMINOPHEN 5-325 MG PO TABS
1.0000 | ORAL_TABLET | ORAL | Status: DC | PRN
  Administered 2013-05-15 – 2013-05-16 (×2): 1 via ORAL
  Filled 2013-05-14: qty 1

## 2013-05-14 MED ORDER — ROCURONIUM BROMIDE 50 MG/5ML IV SOLN
INTRAVENOUS | Status: AC
  Filled 2013-05-14: qty 1

## 2013-05-14 MED ORDER — PROPOFOL 10 MG/ML IV BOLUS
INTRAVENOUS | Status: DC | PRN
  Administered 2013-05-14 (×3): 10 mg via INTRAVENOUS
  Administered 2013-05-14: 170 mg via INTRAVENOUS

## 2013-05-14 MED ORDER — ACETAMINOPHEN 650 MG RE SUPP: 650.0000 mg | RECTAL | Status: DC | PRN

## 2013-05-14 MED ORDER — PHENYLEPHRINE HCL 10 MG/ML IJ SOLN
10.0000 mg | INTRAVENOUS | Status: DC | PRN
  Administered 2013-05-14: 25 ug/min via INTRAVENOUS

## 2013-05-14 MED ORDER — LIDOCAINE HCL (CARDIAC) 20 MG/ML IV SOLN
INTRAVENOUS | Status: AC
  Filled 2013-05-14: qty 5

## 2013-05-14 MED ORDER — ONDANSETRON HCL 4 MG/2ML IJ SOLN
INTRAMUSCULAR | Status: DC | PRN
  Administered 2013-05-14: 4 mg via INTRAVENOUS

## 2013-05-14 MED ORDER — LORATADINE 10 MG PO TABS
10.0000 mg | ORAL_TABLET | Freq: Every day | ORAL | Status: DC | PRN
  Filled 2013-05-14: qty 1

## 2013-05-14 MED ORDER — PROPOFOL 10 MG/ML IV BOLUS
INTRAVENOUS | Status: AC
  Filled 2013-05-14: qty 20

## 2013-05-14 MED ORDER — SURGIFOAM 100 EX MISC
CUTANEOUS | Status: DC | PRN
  Administered 2013-05-14: 13:00:00 via TOPICAL

## 2013-05-14 SURGICAL SUPPLY — 95 items
ADH SKN CLS APL DERMABOND .7 (GAUZE/BANDAGES/DRESSINGS) ×2
APL SKNCLS STERI-STRIP NONHPOA (GAUZE/BANDAGES/DRESSINGS) ×2
BAG DECANTER FOR FLEXI CONT (MISCELLANEOUS) ×4 IMPLANT
BENZOIN TINCTURE PRP APPL 2/3 (GAUZE/BANDAGES/DRESSINGS) ×3 IMPLANT
BLADE SURG ROTATE 9660 (MISCELLANEOUS) IMPLANT
BONE MATRIX OSTEOCEL PRO MED (Bone Implant) ×9 IMPLANT
BUR MATCHSTICK NEURO 3.0 LAGG (BURR) ×4 IMPLANT
BUR PRECISION FLUTE 5.0 (BURR) ×4 IMPLANT
CAGE PLIF MAS 9X8X28-4 LUMBAR (Cage) ×6 IMPLANT
CANISTER SUCT 3000ML (MISCELLANEOUS) ×4 IMPLANT
CLOSURE WOUND 1/2 X4 (GAUZE/BANDAGES/DRESSINGS) ×1
CONT SPEC 4OZ CLIKSEAL STRL BL (MISCELLANEOUS) ×8 IMPLANT
COROENT 10X9X28-4 (Spacer) ×6 IMPLANT
COVER BACK TABLE 24X17X13 BIG (DRAPES) IMPLANT
COVER TABLE BACK 60X90 (DRAPES) ×4 IMPLANT
DERMABOND ADVANCED (GAUZE/BANDAGES/DRESSINGS) ×2
DERMABOND ADVANCED .7 DNX12 (GAUZE/BANDAGES/DRESSINGS) ×2 IMPLANT
DRAPE C-ARM 42X72 X-RAY (DRAPES) ×5 IMPLANT
DRAPE C-ARMOR (DRAPES) ×3 IMPLANT
DRAPE LAPAROTOMY 100X72X124 (DRAPES) ×4 IMPLANT
DRAPE POUCH INSTRU U-SHP 10X18 (DRAPES) ×4 IMPLANT
DRAPE PROXIMA HALF (DRAPES) ×3 IMPLANT
DRAPE SURG 17X23 STRL (DRAPES) ×4 IMPLANT
DRESSING TELFA 8X3 (GAUZE/BANDAGES/DRESSINGS) IMPLANT
DRSG OPSITE POSTOP 4X10 (GAUZE/BANDAGES/DRESSINGS) ×3 IMPLANT
DURAPREP 26ML APPLICATOR (WOUND CARE) ×4 IMPLANT
ELECT BLADE 4.0 EZ CLEAN MEGAD (MISCELLANEOUS) ×4
ELECT REM PT RETURN 9FT ADLT (ELECTROSURGICAL) ×4
ELECTRODE BLDE 4.0 EZ CLN MEGD (MISCELLANEOUS) ×1 IMPLANT
ELECTRODE REM PT RTRN 9FT ADLT (ELECTROSURGICAL) ×2 IMPLANT
EVACUATOR 1/8 PVC DRAIN (DRAIN) ×3 IMPLANT
GAUZE SPONGE 4X4 16PLY XRAY LF (GAUZE/BANDAGES/DRESSINGS) IMPLANT
GLOVE BIO SURGEON STRL SZ8 (GLOVE) ×8 IMPLANT
GLOVE BIOGEL PI IND STRL 7.5 (GLOVE) ×2 IMPLANT
GLOVE BIOGEL PI IND STRL 8 (GLOVE) ×5 IMPLANT
GLOVE BIOGEL PI IND STRL 8.5 (GLOVE) ×4 IMPLANT
GLOVE BIOGEL PI INDICATOR 7.5 (GLOVE) ×4
GLOVE BIOGEL PI INDICATOR 8 (GLOVE) ×6
GLOVE BIOGEL PI INDICATOR 8.5 (GLOVE) ×4
GLOVE ECLIPSE 7.5 STRL STRAW (GLOVE) ×9 IMPLANT
GLOVE ECLIPSE 8.0 STRL XLNG CF (GLOVE) ×8 IMPLANT
GLOVE EXAM NITRILE LRG STRL (GLOVE) IMPLANT
GLOVE EXAM NITRILE MD LF STRL (GLOVE) IMPLANT
GLOVE EXAM NITRILE XL STR (GLOVE) IMPLANT
GLOVE EXAM NITRILE XS STR PU (GLOVE) IMPLANT
GLOVE SS BIOGEL STRL SZ 6.5 (GLOVE) ×1 IMPLANT
GLOVE SUPERSENSE BIOGEL SZ 6.5 (GLOVE) ×2
GLOVE SURG SS PI 7.0 STRL IVOR (GLOVE) ×6 IMPLANT
GOWN BRE IMP SLV AUR LG STRL (GOWN DISPOSABLE) IMPLANT
GOWN BRE IMP SLV AUR XL STRL (GOWN DISPOSABLE) ×2 IMPLANT
GOWN STRL REIN 2XL LVL4 (GOWN DISPOSABLE) ×2 IMPLANT
GOWN STRL REUS W/ TWL LRG LVL3 (GOWN DISPOSABLE) ×3 IMPLANT
GOWN STRL REUS W/ TWL XL LVL3 (GOWN DISPOSABLE) ×1 IMPLANT
GOWN STRL REUS W/TWL 2XL LVL3 (GOWN DISPOSABLE) ×12 IMPLANT
GOWN STRL REUS W/TWL LRG LVL3 (GOWN DISPOSABLE) ×12
GOWN STRL REUS W/TWL XL LVL3 (GOWN DISPOSABLE) ×4
KIT BASIN OR (CUSTOM PROCEDURE TRAY) ×4 IMPLANT
KIT POSITION SURG JACKSON T1 (MISCELLANEOUS) ×4 IMPLANT
KIT ROOM TURNOVER OR (KITS) ×4 IMPLANT
MILL MEDIUM DISP (BLADE) ×4 IMPLANT
NDL HYPO 25X1 1.5 SAFETY (NEEDLE) ×1 IMPLANT
NDL SPNL 18GX3.5 QUINCKE PK (NEEDLE) IMPLANT
NEEDLE HYPO 25X1 1.5 SAFETY (NEEDLE) ×4 IMPLANT
NEEDLE SPNL 18GX3.5 QUINCKE PK (NEEDLE) ×4 IMPLANT
NS IRRIG 1000ML POUR BTL (IV SOLUTION) ×7 IMPLANT
PACK LAMINECTOMY NEURO (CUSTOM PROCEDURE TRAY) ×4 IMPLANT
PAD ARMBOARD 7.5X6 YLW CONV (MISCELLANEOUS) ×12 IMPLANT
PATTIES SURGICAL .5 X.5 (GAUZE/BANDAGES/DRESSINGS) IMPLANT
PATTIES SURGICAL .5 X1 (DISPOSABLE) IMPLANT
PATTIES SURGICAL 1X1 (DISPOSABLE) IMPLANT
ROD ARM15T PREBENT CONF 120MM (Rod) ×3 IMPLANT
ROD PREBENT ARMADA 110MM 5.5 (Rod) ×3 IMPLANT
SCREW ARMADA 6.5X50 (Screw) ×18 IMPLANT
SCREW LOCK (Screw) ×32 IMPLANT
SCREW LOCK 100X5.5X OPN (Screw) ×8 IMPLANT
SCREW POLY 45X6.5 (Screw) ×2 IMPLANT
SCREW POLY 6.5X45MM (Screw) ×8 IMPLANT
SLEEVE SURGEON STRL (DRAPES) ×3 IMPLANT
SPONGE GAUZE 4X4 12PLY (GAUZE/BANDAGES/DRESSINGS) ×4 IMPLANT
SPONGE LAP 4X18 X RAY DECT (DISPOSABLE) IMPLANT
SPONGE SURGIFOAM ABS GEL 100 (HEMOSTASIS) ×4 IMPLANT
STAPLER SKIN PROX WIDE 3.9 (STAPLE) ×3 IMPLANT
STRIP CLOSURE SKIN 1/2X4 (GAUZE/BANDAGES/DRESSINGS) ×3 IMPLANT
SUT VIC AB 1 CT1 18XBRD ANBCTR (SUTURE) ×4 IMPLANT
SUT VIC AB 1 CT1 8-18 (SUTURE) ×8
SUT VIC AB 2-0 CT1 18 (SUTURE) ×8 IMPLANT
SUT VIC AB 3-0 SH 8-18 (SUTURE) ×11 IMPLANT
SYR 20CC LL (SYRINGE) ×4 IMPLANT
SYR 3ML LL SCALE MARK (SYRINGE) ×16 IMPLANT
SYR 5ML LL (SYRINGE) IMPLANT
TOWEL OR 17X24 6PK STRL BLUE (TOWEL DISPOSABLE) ×4 IMPLANT
TOWEL OR 17X26 10 PK STRL BLUE (TOWEL DISPOSABLE) ×4 IMPLANT
TRAP SPECIMEN MUCOUS 40CC (MISCELLANEOUS) ×4 IMPLANT
TRAY FOLEY CATH 16FRSI W/METER (SET/KITS/TRAYS/PACK) ×3 IMPLANT
WATER STERILE IRR 1000ML POUR (IV SOLUTION) ×4 IMPLANT

## 2013-05-14 NOTE — Anesthesia Preprocedure Evaluation (Signed)
Anesthesia Evaluation  Patient identified by MRN, date of birth, ID band Patient awake    Reviewed: Allergy & Precautions, H&P , NPO status , Patient's Chart, lab work & pertinent test results, reviewed documented beta blocker date and time   Airway Mallampati: II TM Distance: >3 FB Neck ROM: Full    Dental  (+) Teeth Intact and Dental Advisory Given   Pulmonary Current Smoker,  breath sounds clear to auscultation        Cardiovascular hypertension, Rhythm:Regular Rate:Normal     Neuro/Psych    GI/Hepatic   Endo/Other    Renal/GU      Musculoskeletal   Abdominal   Peds  Hematology   Anesthesia Other Findings   Reproductive/Obstetrics                           Anesthesia Physical Anesthesia Plan  ASA: III  Anesthesia Plan: General   Post-op Pain Management:    Induction: Intravenous  Airway Management Planned: Oral ETT  Additional Equipment:   Intra-op Plan:   Post-operative Plan: Extubation in OR  Informed Consent: I have reviewed the patients History and Physical, chart, labs and discussed the procedure including the risks, benefits and alternatives for the proposed anesthesia with the patient or authorized representative who has indicated his/her understanding and acceptance.   Dental advisory given  Plan Discussed with: CRNA and Anesthesiologist  Anesthesia Plan Comments: (Htn Smoker Lumbar spinal spondylolisthesis  Plan GA with oral ETT  Roberts Gaudy, MD)        Anesthesia Quick Evaluation

## 2013-05-14 NOTE — Progress Notes (Signed)
Awake, alert, conversant.  Full strength both legs.  Doing well.

## 2013-05-14 NOTE — Anesthesia Postprocedure Evaluation (Signed)
  Anesthesia Post-op Note  Patient: Perry Cervantes  Procedure(s) Performed: Procedure(s) with comments: Lumbar One-Two, Lumbar Two-Three, Lumbar Three-Four, Exploration and Extension of previous fusion - Lumbar One-Two, Lumbar Two-Three, Lumbar Three-Four, Exploration and Extension of previous fusion  Patient Location: PACU  Anesthesia Type:General  Level of Consciousness: awake, alert  and oriented  Airway and Oxygen Therapy: Patient Spontanous Breathing  Post-op Pain: mild  Post-op Assessment: Post-op Vital signs reviewed  Post-op Vital Signs: Reviewed  Complications: No apparent anesthesia complications

## 2013-05-14 NOTE — Transfer of Care (Signed)
Immediate Anesthesia Transfer of Care Note  Patient: Perry Cervantes  Procedure(s) Performed: Procedure(s) with comments: Lumbar One-Two, Lumbar Two-Three, Lumbar Three-Four, Exploration and Extension of previous fusion - Lumbar One-Two, Lumbar Two-Three, Lumbar Three-Four, Exploration and Extension of previous fusion  Patient Location: PACU  Anesthesia Type:General  Level of Consciousness: patient cooperative and responds to stimulation  Airway & Oxygen Therapy: Patient Spontanous Breathing and Patient connected to nasal cannula oxygen  Post-op Assessment: Report given to PACU RN, Post -op Vital signs reviewed and stable and Patient moving all extremities X 4  Post vital signs: Reviewed and stable  Complications: No apparent anesthesia complications

## 2013-05-14 NOTE — Interval H&P Note (Signed)
History and Physical Interval Note:  05/14/2013 7:13 AM  Perry Cervantes  has presented today for surgery, with the diagnosis of Spondylolisthesis, Spondylosis, Scoliosis, Radiculopathy  The various methods of treatment have been discussed with the patient and family. After consideration of risks, benefits and other options for treatment, the patient has consented to  Procedure(s) with comments: POSTERIOR LUMBAR FUSION 3 LEVEL (N/A) - L1-2 L2-3 L3-4 Exploration and extension of fusion as a surgical intervention .  The patient's history has been reviewed, patient examined, no change in status, stable for surgery.  I have reviewed the patient's chart and labs.  Questions were answered to the patient's satisfaction.     Lashon Beringer D

## 2013-05-14 NOTE — Brief Op Note (Signed)
05/14/2013  6:35 PM  PATIENT:  Perry Cervantes  63 y.o. male  PRE-OPERATIVE DIAGNOSIS:  Spondylolisthesis, Spondylosis, Scoliosis, Radiculopathy L 12, L 23, L 34  POST-OPERATIVE DIAGNOSIS:  Spondylolisthesis, Spondylosis, Scoliosis, Radiculopathy L 12, L 23, L 34  PROCEDURE:  Procedure(s) with comments: Lumbar One-Two, Lumbar Two-Three, Lumbar Three-Four, Exploration and Extension of previous fusion - Lumbar One-Two, Lumbar Two-Three, Lumbar Three-Four, Exploration and Extension of previous fusion L 45 level with posterolateral arthrodesis and pedicle screw fixation  SURGEON:  Surgeon(s) and Role:    * Erline Levine, MD - Primary    * Otilio Connors, MD - Assisting  PHYSICIAN ASSISTANT:   ASSISTANTS: Poteat, RN   ANESTHESIA:   general  EBL:  Total I/O In: 3970 [I.V.:3000; Blood:470; IV Piggyback:500] Out: 7616 [Urine:375; Blood:800]  BLOOD ADMINISTERED:250 CC CELLSAVER  DRAINS: (Medium ) Hemovact drain(s) in the epidural space with  Suction Open   LOCAL MEDICATIONS USED:  LIDOCAINE   SPECIMEN:  No Specimen  DISPOSITION OF SPECIMEN:  N/A  COUNTS:  YES  TOURNIQUET:  * No tourniquets in log *  DICTATION: Patient is 63 year old man with lumbar scoliosis and previous fusion L 45 level with stenosis at  L 3/4 and retrolisthesis L 23 and L 12 with severe spinal stenosis and a long history of severe back and bilateral leg pain.  It was elected to take him to surgery for exploration of previous fusion with decompression and fusion from L 12 through L 45 levels.   Procedure: Patient was placed in a prone position on the [place] table after smooth and uncomplicated induction of general endotracheal anesthesia. His low back was prepped and draped in usual sterile fashion with betadine scrub and DuraPrep. Area of incision was infiltrated with local lidocaine. Incision was made to the lumbodorsal fascia was incised and exposure was performed of the L 1 - L 5 spinous processes laminae  facet joint and transverse processes. Previous hardware was exposed.  This was covered with dense bone growth and required a chisel to remove the bone to expose the previously placed hardware. The bone scres were removed and appeared to be solidly in bone and there was dense bridging bone across the L 4 5 level.  Intraoperative x-ray was obtained which confirmed correct orientation with marker probes at L1  Through L 4 levles. A total laminectomy of L 1 through L 4  levels was performed with disarticulation of the facet joints and thorough decompression was performed of both L1 , L 2 , L 3  L 4  nerve roots along with the common dural tube. Decompression was greater than would be typical for PLIF. A thorough discectomy with removal  Of free fragments at L3/4 and L4/5 level was performed with removal of a large free fragment behind the L 4 vertebral body. A thorough discectomy was then performed on the left with preparation of the endplates for grafting a trial spacer was placed this level and a thorough discectomy was performed on the right as well at L 34.  After trial sizing and utilization of sequential shavers, interspaces were packed with autograft, Osteocel Plus and 92mm  X 28 x 4 degree lordotic PEEK cages with 10 cc of autograft in the intrerspace.  8 mm cages were placed at L 2/3 after thorough decompression at this level . There was good correction of scoliotic curvature. The posterolateral region was extensively decorticated and pedicle probes were placed at L 1  through L4 bilaterally. Intraoperative fluoroscopy confirmed  correct orientationin the AP and lateral plane. 45 x 6.5 mm pedicle screws were placed at L 4 bilaterally and 50 x 6.5 mm screws placed at L 1, L 2, L 3 bilaterally  Final x-rays demonstrated well-positioned interbody grafts and pedicle screw fixation. A 120 mm lordotic rod was placed on the right and a 120 mm rod was placed on the left locked down in situ and the posterolateral region  was packed with 30 cc bone graft on the right and  A similar amount on the left. The wound was irrigated. A medium Hemovac drain was placed. Fascia was closed with 1 Vicryl sutures skin edges were reapproximated 2 and 3-0 Vicryl sutures. The wound was dressed with Dermabond and  an occlusive dressing the patient was extubated in the operating room and taken to recovery in stable satisfactory condition she tolerated traction well counts were correct at the end of the case.   PLAN OF CARE: Admit to inpatient   PATIENT DISPOSITION:  PACU - hemodynamically stable.   Delay start of Pharmacological VTE agent (>24hrs) due to surgical blood loss or risk of bleeding: yes

## 2013-05-14 NOTE — Op Note (Signed)
05/14/2013  6:35 PM  PATIENT:  Perry Cervantes  63 y.o. male  PRE-OPERATIVE DIAGNOSIS:  Spondylolisthesis, Spondylosis, Scoliosis, Radiculopathy L 12, L 23, L 34  POST-OPERATIVE DIAGNOSIS:  Spondylolisthesis, Spondylosis, Scoliosis, Radiculopathy L 12, L 23, L 34  PROCEDURE:  Procedure(s) with comments: Lumbar One-Two, Lumbar Two-Three, Lumbar Three-Four, Exploration and Extension of previous fusion - Lumbar One-Two, Lumbar Two-Three, Lumbar Three-Four, Exploration and Extension of previous fusion L 45 level with posterolateral arthrodesis and pedicle screw fixation  SURGEON:  Surgeon(s) and Role:    * Madisen Ludvigsen, MD - Primary    * James R Hirsch, MD - Assisting  PHYSICIAN ASSISTANT:   ASSISTANTS: Poteat, RN   ANESTHESIA:   general  EBL:  Total I/O In: 3970 [I.V.:3000; Blood:470; IV Piggyback:500] Out: 1175 [Urine:375; Blood:800]  BLOOD ADMINISTERED:250 CC CELLSAVER  DRAINS: (Medium ) Hemovact drain(s) in the epidural space with  Suction Open   LOCAL MEDICATIONS USED:  LIDOCAINE   SPECIMEN:  No Specimen  DISPOSITION OF SPECIMEN:  N/A  COUNTS:  YES  TOURNIQUET:  * No tourniquets in log *  DICTATION: Patient is 63-year-old man with lumbar scoliosis and previous fusion L 45 level with stenosis at  L 3/4 and retrolisthesis L 23 and L 12 with severe spinal stenosis and a long history of severe back and bilateral leg pain.  It was elected to take him to surgery for exploration of previous fusion with decompression and fusion from L 12 through L 45 levels.   Procedure: Patient was placed in a prone position on the [place] table after smooth and uncomplicated induction of general endotracheal anesthesia. His low back was prepped and draped in usual sterile fashion with betadine scrub and DuraPrep. Area of incision was infiltrated with local lidocaine. Incision was made to the lumbodorsal fascia was incised and exposure was performed of the L 1 - L 5 spinous processes laminae  facet joint and transverse processes. Previous hardware was exposed.  This was covered with dense bone growth and required a chisel to remove the bone to expose the previously placed hardware. The bone scres were removed and appeared to be solidly in bone and there was dense bridging bone across the L 4 5 level.  Intraoperative x-ray was obtained which confirmed correct orientation with marker probes at L1  Through L 4 levles. A total laminectomy of L 1 through L 4  levels was performed with disarticulation of the facet joints and thorough decompression was performed of both L1 , L 2 , L 3  L 4  nerve roots along with the common dural tube. Decompression was greater than would be typical for PLIF. A thorough discectomy with removal  Of free fragments at L3/4 and L4/5 level was performed with removal of a large free fragment behind the L 4 vertebral body. A thorough discectomy was then performed on the left with preparation of the endplates for grafting a trial spacer was placed this level and a thorough discectomy was performed on the right as well at L 34.  After trial sizing and utilization of sequential shavers, interspaces were packed with autograft, Osteocel Plus and 10mm  X 28 x 4 degree lordotic PEEK cages with 10 cc of autograft in the intrerspace.  8 mm cages were placed at L 2/3 after thorough decompression at this level . There was good correction of scoliotic curvature. The posterolateral region was extensively decorticated and pedicle probes were placed at L 1  through L4 bilaterally. Intraoperative fluoroscopy confirmed   correct orientationin the AP and lateral plane. 45 x 6.5 mm pedicle screws were placed at L 4 bilaterally and 50 x 6.5 mm screws placed at L 1, L 2, L 3 bilaterally  Final x-rays demonstrated well-positioned interbody grafts and pedicle screw fixation. A 120 mm lordotic rod was placed on the right and a 120 mm rod was placed on the left locked down in situ and the posterolateral region  was packed with 30 cc bone graft on the right and  A similar amount on the left. The wound was irrigated. A medium Hemovac drain was placed. Fascia was closed with 1 Vicryl sutures skin edges were reapproximated 2 and 3-0 Vicryl sutures. The wound was dressed with Dermabond and  an occlusive dressing the patient was extubated in the operating room and taken to recovery in stable satisfactory condition she tolerated traction well counts were correct at the end of the case.   PLAN OF CARE: Admit to inpatient   PATIENT DISPOSITION:  PACU - hemodynamically stable.   Delay start of Pharmacological VTE agent (>24hrs) due to surgical blood loss or risk of bleeding: yes

## 2013-05-14 NOTE — Anesthesia Procedure Notes (Signed)
Procedure Name: Intubation Date/Time: 05/14/2013 12:39 PM Performed by: Daisy Lazar Pre-anesthesia Checklist: Emergency Drugs available, Patient identified, Suction available and Patient being monitored Patient Re-evaluated:Patient Re-evaluated prior to inductionOxygen Delivery Method: Circle system utilized Preoxygenation: Pre-oxygenation with 100% oxygen Intubation Type: IV induction Ventilation: Mask ventilation without difficulty and Oral airway inserted - appropriate to patient size Laryngoscope Size: Mac and 3 Grade View: Grade I Tube type: Oral Tube size: 8.0 mm Number of attempts: 1 Airway Equipment and Method: Stylet Placement Confirmation: ETT inserted through vocal cords under direct vision,  breath sounds checked- equal and bilateral and positive ETCO2 Secured at: 23 cm Tube secured with: Tape Dental Injury: Teeth and Oropharynx as per pre-operative assessment

## 2013-05-15 ENCOUNTER — Encounter (HOSPITAL_COMMUNITY): Payer: Self-pay | Admitting: General Practice

## 2013-05-15 NOTE — Clinical Social Work Note (Signed)
CSW received consult for possible SNF placement at time of discharge. Pt is currently not being recommended for any services at time of discharge. CSW signing off. Please re consult if needed.  Pati Gallo, [place] Social Worker [phone]

## 2013-05-15 NOTE — Progress Notes (Signed)
UR completed 

## 2013-05-15 NOTE — Progress Notes (Signed)
Subjective: Patient reports "I hurt all night in my back, but that tingling and pain in my legs and feet is gone"  Objective: Vital signs in last 24 hours: Temp:  [98.1 F (36.7 C)-98.3 F (36.8 C)] 98.1 F (36.7 C) (01/23 0514) Pulse Rate:  [70-88] 88 (01/23 0514) Resp:  [12-20] 18 (01/23 0514) BP: (109-152)/(59-113) 118/61 mmHg (01/23 0514) SpO2:  [95 %-99 %] 96 % (01/23 0514) Weight:  [104 kg (229 lb 4.5 oz)] 104 kg (229 lb 4.5 oz) (01/22 2137)  Intake/Output from previous day: 01/22 0701 - 01/23 0700 In: 5071.3 [P.O.:300; I.V.:3701.3; Blood:470; IV SWFUXNATF:573] Out: 1175 [Urine:375; Blood:800] Intake/Output this shift: Total I/O In: 480 [P.O.:480] Out: 2700 [Urine:2525; Drains:175]  Alert, conversant. Pt reports significant lumbar/incisional pain, but also notes resolution of pain numbness and tingling in legs & feet. Drsg intact. Incision without erythema, swelling, or drainage. Hemovac patent. Good strength BLE.  Lab Results: No results found for this basename: WBC, HGB, HCT, PLT,  in the last 72 hours BMET No results found for this basename: NA, K, CL, CO2, GLUCOSE, BUN, CREATININE, CALCIUM,  in the last 72 hours  Studies/Results: Dg Lumbar Spine 2-3 Views  05/14/2013   CLINICAL DATA:  L1-4 extension of fusion.  EXAM: LUMBAR SPINE - 2-3 VIEW; DG C-ARM 1-60 MIN  COMPARISON:  DG LUMBAR SPINE 1 VIEW dated 05/14/2013; MR L SPINE WO/W CM dated 03/11/2013; DG LUMBAR SPINE COMPLETE 4+V dated 03/09/2013  FINDINGS: Three spot fluoroscopic images demonstrate removal of the L5 pedicle screws and the placement of bilateral pedicle screws at L1, L2, L3 and L4. There are new interbody spacers at the L2-3 and L3-4 levels which appear well positioned. The interconnecting rods have not yet been placed.  IMPRESSION: No demonstrated complication during lumbar fusion.   Electronically Signed   By: Camie Patience M.D.   On: 05/14/2013 18:19   Dg Lumbar Spine 1 View  05/14/2013   CLINICAL DATA:   Lumbar fusion  EXAM: LUMBAR SPINE - 1 VIEW  COMPARISON:  MR 03/11/2013  FINDINGS: Single lateral intraoperative portable radiograph. Graft markers in the L4-5 interspace. Posterior surgical instruments, with probe TIPS projecting behind L1, L2, and L3 pedicles, and a fourth instrument overlying the left L4 pedicles.  IMPRESSION: Intraoperative localization   Electronically Signed   By: Arne Cleveland M.D.   On: 05/14/2013 15:54   Dg C-arm 1-60 Min  05/14/2013   CLINICAL DATA:  L1-4 extension of fusion.  EXAM: LUMBAR SPINE - 2-3 VIEW; DG C-ARM 1-60 MIN  COMPARISON:  DG LUMBAR SPINE 1 VIEW dated 05/14/2013; MR L SPINE WO/W CM dated 03/11/2013; DG LUMBAR SPINE COMPLETE 4+V dated 03/09/2013  FINDINGS: Three spot fluoroscopic images demonstrate removal of the L5 pedicle screws and the placement of bilateral pedicle screws at L1, L2, L3 and L4. There are new interbody spacers at the L2-3 and L3-4 levels which appear well positioned. The interconnecting rods have not yet been placed.  IMPRESSION: No demonstrated complication during lumbar fusion.   Electronically Signed   By: Camie Patience M.D.   On: 05/14/2013 18:19    Assessment/Plan: Improving   LOS: 1 day  Per DrStern, will d/c Diclofenac & pt agrees to avoid x10-12 weeks post-op.  Will mobilize in LSO with PT. Encouraged Oxycodone rather than Hydrocodone for persistent pain.    Verdis Prime 05/15/2013, 9:36 AM

## 2013-05-15 NOTE — Evaluation (Signed)
Physical Therapy Evaluation Patient Details Name: Perry Cervantes MRN: [id number] DOB: 1950/05/13 Today's Date: 05/15/2013 Time: 4098-1191 PT Time Calculation (min): 27 min  PT Assessment / Plan / Recommendation History of Present Illness  Patient is a 63 yo male s/p Lumbar One-Two, Lumbar Two-Three, Lumbar Three-Four, Exploration and Extension of previous fusion - Lumbar One-Two, Lumbar Two-Three, Lumbar Three-Four, Exploration and Extension of previous fusion.  Clinical Impression  Patient demonstrates deficits in functional mobility as indicated below. Pt will benefit from continued skilled PT to address deficits and maximize independence. Will continue to see as indicated.     PT Assessment  Patient needs continued PT services    Follow Up Recommendations  No PT follow up;Supervision - Intermittent          Equipment Recommendations   (patient has equipment recomended)       Frequency Min 5X/week    Precautions / Restrictions Precautions Precautions: Back Precaution Booklet Issued: Yes (comment) Precaution Comments: Educted and reviewed Required Braces or Orthoses: Spinal Brace Spinal Brace: Lumbar corset   Pertinent Vitals/Pain 8/10      Mobility  Transfers Overall transfer level: Needs assistance Equipment used: Rolling walker (2 wheeled) Transfers: Sit to/from Stand Sit to Stand: Min guard General transfer comment: VCs for hand placement and technique Ambulation/Gait Ambulation/Gait assistance: Supervision Ambulation Distance (Feet): 210 Feet Assistive device: Rolling walker (2 wheeled) Gait Pattern/deviations: Step-through pattern;Decreased stride length Gait velocity: decreased Gait velocity interpretation: Below normal speed for age/gender General Gait Details: steady with ambulation    Exercises     PT Diagnosis: Difficulty walking;Acute pain  PT Problem List: Decreased strength;Decreased range of motion;Decreased activity tolerance;Decreased  mobility;Decreased balance;Pain;Decreased knowledge of precautions PT Treatment Interventions: DME instruction;Gait training;Stair training;Functional mobility training;Therapeutic activities;Therapeutic exercise;Balance training;Patient/family education     PT Goals(Current goals can be found in the care plan section) Acute Rehab PT Goals Patient Stated Goal: to go home PT Goal Formulation: With patient Time For Goal Achievement: 05/29/13 Potential to Achieve Goals: Good  Visit Information  Last PT Received On: 05/15/13 Assistance Needed: +1 History of Present Illness: Patient is a 63 yo male s/p Lumbar One-Two, Lumbar Two-Three, Lumbar Three-Four, Exploration and Extension of previous fusion - Lumbar One-Two, Lumbar Two-Three, Lumbar Three-Four, Exploration and Extension of previous fusion.       Prior [place] expects to be discharged to:: Private residence Living Arrangements: Spouse/significant other Available Help at Discharge: Family Type of Home: House Home Access: Stairs to enter Technical brewer of Steps: 2 Entrance Stairs-Rails: Right Home Layout: One level Home Equipment: Environmental consultant - 2 wheels;Cane - single point;Bedside commode Prior Function Level of Independence: Independent Communication Communication: No difficulties Dominant Hand: Left    Cognition  Cognition Arousal/Alertness: Awake/alert Behavior During Therapy: WFL for tasks assessed/performed Overall Cognitive Status: Within Functional Limits for tasks assessed    Extremity/Trunk Assessment Upper Extremity Assessment Upper Extremity Assessment: Overall WFL for tasks assessed Lower Extremity Assessment Lower Extremity Assessment: Overall WFL for tasks assessed   Balance General Comments General comments (skin integrity, edema, etc.): Educated patient on mobility expectations, proper use of brace, positioning for bed mobility as well as sleeping, and spinal precautions  reviewed.  End of Session PT - End of Session Equipment Utilized During Treatment: Gait belt;Back brace Activity Tolerance: Patient limited by pain Patient left: in chair;with call bell/phone within reach Nurse Communication: Mobility status  GP     Duncan Dull 05/15/2013, 12:32 PM Alben Deeds, [place] DPT  [phone]

## 2013-05-16 NOTE — Progress Notes (Signed)
Patient ID: Perry Cervantes, male   DOB: 09-Jan-1951, 63 y.o.   MRN: [id number] Subjective:  the patient is alert and pleasant. He looks well. His back is appropriately sore.  Objective: Vital signs in last 24 hours: Temp:  [97.9 F (36.6 C)-98.6 F (37 C)] 98.2 F (36.8 C) (01/24 1003) Pulse Rate:  [70-96] 96 (01/24 1003) Resp:  [18-20] 18 (01/24 1003) BP: (114-123)/(56-73) 122/73 mmHg (01/24 1003) SpO2:  [93 %-100 %] 100 % (01/24 1003)  Intake/Output from previous day: 01/23 0701 - 01/24 0700 In: 960 [P.O.:960] Out: 4005 [Urine:3525; Drains:480] Intake/Output this shift:    Physical exam the patient is alert and oriented. He is moving his lower extremities well.  Lab Results: No results found for this basename: WBC, HGB, HCT, PLT,  in the last 72 hours BMET No results found for this basename: NA, K, CL, CO2, GLUCOSE, BUN, CREATININE, CALCIUM,  in the last 72 hours  Studies/Results: Dg Lumbar Spine 2-3 Views  05/14/2013   CLINICAL DATA:  L1-4 extension of fusion.  EXAM: LUMBAR SPINE - 2-3 VIEW; DG C-ARM 1-60 MIN  COMPARISON:  DG LUMBAR SPINE 1 VIEW dated 05/14/2013; MR L SPINE WO/W CM dated 03/11/2013; DG LUMBAR SPINE COMPLETE 4+V dated 03/09/2013  FINDINGS: Three spot fluoroscopic images demonstrate removal of the L5 pedicle screws and the placement of bilateral pedicle screws at L1, L2, L3 and L4. There are new interbody spacers at the L2-3 and L3-4 levels which appear well positioned. The interconnecting rods have not yet been placed.  IMPRESSION: No demonstrated complication during lumbar fusion.   Electronically Signed   By: Camie Patience M.D.   On: 05/14/2013 18:19   Dg Lumbar Spine 1 View  05/14/2013   CLINICAL DATA:  Lumbar fusion  EXAM: LUMBAR SPINE - 1 VIEW  COMPARISON:  MR 03/11/2013  FINDINGS: Single lateral intraoperative portable radiograph. Graft markers in the L4-5 interspace. Posterior surgical instruments, with probe TIPS projecting behind L1, L2, and L3 pedicles, and  a fourth instrument overlying the left L4 pedicles.  IMPRESSION: Intraoperative localization   Electronically Signed   By: Arne Cleveland M.D.   On: 05/14/2013 15:54   Dg C-arm 1-60 Min  05/14/2013   CLINICAL DATA:  L1-4 extension of fusion.  EXAM: LUMBAR SPINE - 2-3 VIEW; DG C-ARM 1-60 MIN  COMPARISON:  DG LUMBAR SPINE 1 VIEW dated 05/14/2013; MR L SPINE WO/W CM dated 03/11/2013; DG LUMBAR SPINE COMPLETE 4+V dated 03/09/2013  FINDINGS: Three spot fluoroscopic images demonstrate removal of the L5 pedicle screws and the placement of bilateral pedicle screws at L1, L2, L3 and L4. There are new interbody spacers at the L2-3 and L3-4 levels which appear well positioned. The interconnecting rods have not yet been placed.  IMPRESSION: No demonstrated complication during lumbar fusion.   Electronically Signed   By: Camie Patience M.D.   On: 05/14/2013 18:19    Assessment/Plan: Postop day #2: We will continue to mobilize the patient with PT and OT.   LOS: 2 days     Perry Cervantes D 05/16/2013, 10:59 AM

## 2013-05-16 NOTE — Progress Notes (Signed)
OT Cancellation Note and Discharge  Patient Details Name: Perry Cervantes MRN: [id number] DOB: 12/24/1950   Cancelled Treatment:    Reason Eval/Treat Not Completed: OT screened, no needs identified, will sign off. This is the patient's 4th back surgery and so he and his wife feel they do not have any questions about BADLs. Wife is also a retired Marine scientist.  Almon Register [phone] 05/16/2013, 9:41 AM

## 2013-05-16 NOTE — Progress Notes (Signed)
Physical Therapy Treatment Patient Details Name: Perry Cervantes MRN: [id number] DOB: 01/04/1951 Today's Date: 05/16/2013 Time: 3545-6256 PT Time Calculation (min): 10 min  PT Assessment / Plan / Recommendation  History of Present Illness Patient is a 63 yo male s/p Lumbar One-Two, Lumbar Two-Three, Lumbar Three-Four, Exploration and Extension of previous fusion - Lumbar One-Two, Lumbar Two-Three, Lumbar Three-Four, Exploration and Extension of previous fusion.   PT Comments   Pt progressing with mobility/PT goals.    Follow Up Recommendations  No PT follow up;Supervision - Intermittent     Does the patient have the potential to tolerate intense rehabilitation     Barriers to Discharge        Equipment Recommendations       Recommendations for Other Services    Frequency Min 5X/week   Progress towards PT Goals Progress towards PT goals: Progressing toward goals  Plan Current plan remains appropriate    Precautions / Restrictions Precautions Precautions: Back Required Braces or Orthoses: Spinal Brace Spinal Brace: Lumbar corset   Pertinent Vitals/Pain 5/10 back.  Premedicated.  Repositioned for comfort.      Mobility  Transfers Overall transfer level: Needs assistance Equipment used: Rolling walker (2 wheeled) Transfers: Sit to/from Stand Sit to Stand: Supervision Ambulation/Gait Ambulation/Gait assistance: Supervision Ambulation Distance (Feet): 300 Feet Assistive device: Rolling walker (2 wheeled) Gait Pattern/deviations: Step-through pattern;Decreased stride length General Gait Details: Supervision for safety.  Cues for safety as pt has tendency to take 1 hand off walker while talking & keeps walking, also cues to reinforce back precautions Stairs: Yes Stairs assistance: Supervision Stair Management: Two rails;Step to pattern;Forwards Number of Stairs: 5    Exercises     PT Diagnosis:    PT Problem List:   PT Treatment Interventions:     PT Goals (current  goals can now be found in the care plan section) Acute Rehab PT Goals Patient Stated Goal: to go home PT Goal Formulation: With patient Time For Goal Achievement: 05/29/13 Potential to Achieve Goals: Good  Visit Information  Last PT Received On: 05/16/13 Assistance Needed: +1 History of Present Illness: Patient is a 63 yo male s/p Lumbar One-Two, Lumbar Two-Three, Lumbar Three-Four, Exploration and Extension of previous fusion - Lumbar One-Two, Lumbar Two-Three, Lumbar Three-Four, Exploration and Extension of previous fusion.    Subjective Data  Patient Stated Goal: to go home   Cognition  Cognition Arousal/Alertness: Awake/alert Behavior During Therapy: WFL for tasks assessed/performed Overall Cognitive Status: Within Functional Limits for tasks assessed    Balance     End of Session PT - End of Session Equipment Utilized During Treatment: Back brace Activity Tolerance: Patient tolerated treatment well Patient left: in chair;with call bell/phone within reach Nurse Communication: Mobility status   GP     Sena Hitch 05/16/2013, 10:54 AM  Sarajane Marek, PTA [phone] 05/16/2013

## 2013-05-17 MED ORDER — OXYCODONE-ACETAMINOPHEN 10-325 MG PO TABS: 1.0000 | ORAL_TABLET | ORAL | Status: DC | PRN

## 2013-05-17 NOTE — Progress Notes (Signed)
PT Cancellation Note  Patient Details Name: Perry Cervantes MRN: [id number] DOB: [birth year]   Cancelled Treatment:    Reason Eval/Treat Not Completed: Patient declined, no reason specified.  Patient preparing for discharge.  Asked patient and wife if any questions or anything that we need to review.  None per patient and wife.  Patient to discharge today.  No f/u PT needed.   Despina Pole 05/17/2013, 11:33 AM Carita Pian. Sanjuana Kava, [place] Pager [phone]

## 2013-05-17 NOTE — Discharge Summary (Signed)
  Physician Discharge Summary  Patient ID: Perry Cervantes MRN: [id number] DOB/AGE: 10/26/1950 63 y.o.  Admit date: 05/14/2013 Discharge date: 05/17/2013  Admission Diagnoses: Lumbar spondylosis with radiculopathy, L1-2, L2-3, L3-4  Discharge Diagnoses: Same Active Problems:   Lumbar scoliosis   Discharged Condition: Stable  Hospital Course:  Mrs. Perry Cervantes is a 63 y.o. male electively admitted after uncomplicated U7-M5 PLIF. He returned to the neuroscience floor in baseline neurologic condition. He had an uncomplicated recovery with improvement in postoperative pain, ambulating without difficulty with a walker, tolerating diet and voiding without difficulty. He was seen by PT and did not require PT f/u.  Treatments: Surgery - L1-2, L2-3, L3-4 PLIF  Discharge Exam: Blood pressure 128/68, pulse 91, temperature 97.9 F (36.6 C), temperature source Oral, resp. rate 20, height 5\' 10"  (1.778 m), weight 104 kg (229 lb 4.5 oz), SpO2 96.00%. Awake, alert, oriented Speech fluent, appropriate CN grossly intact 5/5 BUE/BLE Wound c/d/i  Follow-up: Follow-up in Dr. Melven Cervantes office [facility] [phone]) in 2-3 weeks  Disposition: 01-Home or Self Care     Medication List    STOP taking these medications       diclofenac 75 MG EC tablet  Commonly known as:  VOLTAREN      TAKE these medications       ADVIL PM PO  Take 2 tablets by mouth at bedtime as needed (for sleep).     amLODipine 10 MG tablet  Commonly known as:  NORVASC  Take 10 mg by mouth daily.     aspirin 81 MG tablet  Take 81 mg by mouth daily.     bisoprolol-hydrochlorothiazide 10-6.25 MG per tablet  Commonly known as:  ZIAC  Take 1 tablet by mouth daily.     cyclobenzaprine 10 MG tablet  Commonly known as:  FLEXERIL  Take 10 mg by mouth 2 (two) times daily as needed for muscle spasms.     lisinopril 5 MG tablet  Commonly known as:  PRINIVIL,ZESTRIL  Take 5 mg by mouth daily.      loratadine 10 MG tablet  Commonly known as:  CLARITIN  Take 10 mg by mouth daily as needed for allergies.     LYRICA 75 MG capsule  Generic drug:  pregabalin  Take 75 mg by mouth 2 (two) times daily.     oxyCODONE-acetaminophen 10-325 MG per tablet  Commonly known as:  PERCOCET  Take 1 tablet by mouth every 4 (four) hours as needed for pain.     pantoprazole 40 MG tablet  Commonly known as:  PROTONIX  TAKE 1 TABLET ONCE DAILY.     ranitidine 150 MG capsule  Commonly known as:  ZANTAC  Take 150 mg by mouth 2 (two) times daily.         SignedConsuella Cervantes, C 05/17/2013, 11:47 AM

## 2013-05-17 NOTE — Progress Notes (Signed)
hemovac does not appear to hold suction, unable to tell at insertion site if it has pulled out any, will speak to rounding MD when they arrive

## 2013-05-18 MED FILL — Sodium Chloride IV Soln 0.9%: INTRAVENOUS | Qty: 3000 | Status: AC

## 2013-05-18 MED FILL — Heparin Sodium (Porcine) Inj 1000 Unit/ML: INTRAMUSCULAR | Qty: 30 | Status: AC

## 2014-04-27 ENCOUNTER — Other Ambulatory Visit: Payer: Self-pay | Admitting: Gastroenterology

## 2014-11-16 ENCOUNTER — Other Ambulatory Visit: Payer: Self-pay | Admitting: Gastroenterology

## 2014-11-16 ENCOUNTER — Encounter: Payer: Self-pay | Admitting: Internal Medicine

## 2014-11-16 NOTE — Telephone Encounter (Signed)
Patient has not been seen in over two years.  RX with one refill. Needs ov prior to further refills.

## 2014-11-16 NOTE — Telephone Encounter (Signed)
APPOINTMENT MADE AND LETTER SENT °

## 2014-12-14 ENCOUNTER — Ambulatory Visit (INDEPENDENT_AMBULATORY_CARE_PROVIDER_SITE_OTHER): Payer: Medicare Other | Admitting: Gastroenterology

## 2014-12-14 ENCOUNTER — Encounter: Payer: Self-pay | Admitting: Gastroenterology

## 2014-12-14 VITALS — BP 142/88 | HR 78 | Temp 97.4°F | Ht 69.0 in | Wt 210.4 lb

## 2014-12-14 DIAGNOSIS — K219 Gastro-esophageal reflux disease without esophagitis: Secondary | ICD-10-CM | POA: Diagnosis not present

## 2014-12-14 MED ORDER — PANTOPRAZOLE SODIUM 40 MG PO TBEC: 40.0000 mg | DELAYED_RELEASE_TABLET | Freq: Every day | ORAL | Status: DC

## 2014-12-14 NOTE — Assessment & Plan Note (Signed)
Continue pantoprazole 40 mg daily. Discussed antireflux measures. Return in 2 years or sooner if needed.

## 2014-12-14 NOTE — Progress Notes (Signed)
Primary Care Physician: Monico Blitz, MD  Primary Gastroenterologist:  Garfield Cornea, MD   Chief Complaint  Patient presents with  . Follow-up    HPI: Perry Cervantes is a 64 y.o. male here for follow-up of reflux. He was last seen in April 2014 at time of ileocolonoscopy. Performed for heme positive stools. Noted to have colonic diverticulosis. Colonic erosions and ulcerations noted in the 8 ascending and descending segments as well as just distal to the ileocecal valve likely due to NSAID insult (Voltaren). Terminal ileum was normal. Several sigmoid polyps which were hyperplastic. Pathology from erosions and ulcers benign. Next colonoscopy planned for April 2024. EGD 2006, small hiatal hernia.  Doing well as long as he takes his pantoprazole every morning. Has recurrent reflux symptoms if misses a dose. Uses Zantac as needed only, usually prior certain meals that he knows will cause indigestion. Denies dysphagia, odynophagia, abdominal pain. Bowel movements are regular. No blood in the stool or melena.  Recovering from lumbar spine fusion done in 2015.   Current Outpatient Prescriptions  Medication Sig Dispense Refill  . amLODipine (NORVASC) 10 MG tablet Take 10 mg by mouth daily.     Marland Kitchen aspirin 81 MG tablet Take 81 mg by mouth daily.    . bisoprolol-hydrochlorothiazide (ZIAC) 10-6.25 MG per tablet Take 1 tablet by mouth daily.     . clonazePAM (KLONOPIN) 0.5 MG tablet     . cyclobenzaprine (FLEXERIL) 10 MG tablet Take 10 mg by mouth 2 (two) times daily as needed for muscle spasms.     Marland Kitchen escitalopram (LEXAPRO) 10 MG tablet Take 10 mg by mouth daily.     Marland Kitchen gabapentin (NEURONTIN) 300 MG capsule     . Ibuprofen-Diphenhydramine Cit (ADVIL PM PO) Take 2 tablets by mouth at bedtime as needed (for sleep).    Marland Kitchen lisinopril (PRINIVIL,ZESTRIL) 5 MG tablet Take 5 mg by mouth daily.     Marland Kitchen loratadine (CLARITIN) 10 MG tablet Take 10 mg by mouth daily as needed for allergies.    Marland Kitchen  oxyCODONE-acetaminophen (PERCOCET) 10-325 MG per tablet Take 1 tablet by mouth every 4 (four) hours as needed for pain. 60 tablet 0  . pantoprazole (PROTONIX) 40 MG tablet TAKE 1 TABLET ONCE DAILY. 30 tablet 0  . ranitidine (ZANTAC) 150 MG capsule Take 150 mg by mouth 2 (two) times daily.     No current facility-administered medications for this visit.    Allergies as of 12/14/2014  . (No Known Allergies)    ROS:  General: Negative for anorexia, weight loss, fever, chills, fatigue, weakness. ENT: Negative for hoarseness, difficulty swallowing , nasal congestion. CV: Negative for chest pain, angina, palpitations, dyspnea on exertion, peripheral edema.  Respiratory: Negative for dyspnea at rest, dyspnea on exertion, cough, sputum, wheezing.  GI: See history of present illness. GU:  Negative for dysuria, hematuria, urinary incontinence, urinary frequency, nocturnal urination.  Endo: Negative for unusual weight change.    Physical Examination:   BP 142/88 mmHg  Pulse 78  Temp(Src) 97.4 F (36.3 C) (Oral)  Ht 5\' 9"  (1.753 m)  Wt 210 lb 6.4 oz (95.437 kg)  BMI 31.06 kg/m2  General: Well-nourished, well-developed in no acute distress.  Eyes: No icterus. Mouth: Oropharyngeal mucosa moist and pink , no lesions erythema or exudate. Lungs: Clear to auscultation bilaterally.  Heart: Regular rate and rhythm, no murmurs rubs or gallops.  Abdomen: Bowel sounds are normal, nontender, nondistended, no hepatosplenomegaly or masses, no abdominal bruits or hernia ,  no rebound or guarding.   Extremities: No lower extremity edema. No clubbing or deformities. Neuro: Alert and oriented x 4   Skin: Warm and dry, no jaundice.   Psych: Alert and cooperative, normal mood and affect.

## 2014-12-14 NOTE — Patient Instructions (Signed)
1. Continue pantoprazole once daily. New prescription for one year of refills sent to your pharmacy.  2. Return to the office in two years or call sooner if needed.

## 2014-12-15 NOTE — Progress Notes (Signed)
CC'ED TO PCP 

## 2015-02-10 ENCOUNTER — Telehealth: Payer: Self-pay | Admitting: Internal Medicine

## 2015-02-10 ENCOUNTER — Other Ambulatory Visit: Payer: Self-pay

## 2015-02-10 ENCOUNTER — Other Ambulatory Visit: Payer: Self-pay | Admitting: Gastroenterology

## 2015-02-10 DIAGNOSIS — R197 Diarrhea, unspecified: Secondary | ICD-10-CM

## 2015-02-10 NOTE — Telephone Encounter (Signed)
Pt is going to the bathroom with watery diarrhea every 30 minutes since 2 am. Nausea x1, no vomiting, no fever, no recent abx, no sick contacts. His wife- Horris Latino, is concerned its cdiff. He has had a cold/cough for a few days but is better now.He wants to know what he should do? Do you want to check him for cdiff?

## 2015-02-10 NOTE — Telephone Encounter (Signed)
Pt is aware. Lab orders done and containers are at the front desk. He said he would come by today and pick it up.

## 2015-02-10 NOTE — Telephone Encounter (Signed)
To early to know if viral or not.  If they are interested in doing stool studies, I would recommend check both Cdiff PCR and GI pathogen panel.  Stay hydrated. Utilize gatorade for electrolytes. Keep urine light yellow.  If recurrent bleeding, significant amounts, then go to ER. If worsening diarrhea, weakness, lightheadedness, muscle cramps go to ER.

## 2015-02-10 NOTE — Telephone Encounter (Signed)
[phone]  PLEASE CALL PATIENT  HE WOKE AT 2AM AND HE HAD A BLOODY BOWL MOVEMENT.  GOING TO THE BATHROOM EVERY 30 MINUTES    LOWER ABD PAIN.  PT WIFE WHO IS A NURSE THINKS IT MAY BE C-DIFF

## 2015-10-13 ENCOUNTER — Telehealth (HOSPITAL_COMMUNITY): Payer: Self-pay | Admitting: *Deleted

## 2015-10-13 NOTE — Telephone Encounter (Signed)
left voice message regarding an appointment. 

## 2015-12-21 ENCOUNTER — Encounter (HOSPITAL_COMMUNITY): Payer: Self-pay | Admitting: Psychiatry

## 2015-12-21 ENCOUNTER — Ambulatory Visit (HOSPITAL_COMMUNITY): Payer: Self-pay | Admitting: Psychiatry

## 2015-12-21 ENCOUNTER — Ambulatory Visit (INDEPENDENT_AMBULATORY_CARE_PROVIDER_SITE_OTHER): Payer: Medicare Other | Admitting: Psychiatry

## 2015-12-21 VITALS — BP 135/83 | HR 72 | Ht 69.0 in | Wt 192.8 lb

## 2015-12-21 DIAGNOSIS — F4310 Post-traumatic stress disorder, unspecified: Secondary | ICD-10-CM | POA: Diagnosis not present

## 2015-12-21 MED ORDER — PRAZOSIN HCL 5 MG PO CAPS: 5.0000 mg | ORAL_CAPSULE | Freq: Every day | ORAL | 2 refills | Status: DC

## 2015-12-21 MED ORDER — CLONAZEPAM 0.5 MG PO TABS: 0.5000 mg | ORAL_TABLET | Freq: Three times a day (TID) | ORAL | 2 refills | Status: DC

## 2015-12-21 MED ORDER — DULOXETINE HCL 60 MG PO CPEP: 60.0000 mg | ORAL_CAPSULE | Freq: Every day | ORAL | 2 refills | Status: DC
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
## 2016-01-11 ENCOUNTER — Other Ambulatory Visit: Payer: Self-pay | Admitting: Gastroenterology

## 2016-01-18 ENCOUNTER — Ambulatory Visit (INDEPENDENT_AMBULATORY_CARE_PROVIDER_SITE_OTHER): Payer: Medicare Other | Admitting: Psychiatry

## 2016-01-18 ENCOUNTER — Other Ambulatory Visit (HOSPITAL_COMMUNITY): Payer: Self-pay | Admitting: Psychiatry

## 2016-01-18 ENCOUNTER — Encounter (HOSPITAL_COMMUNITY): Payer: Self-pay | Admitting: Psychiatry

## 2016-01-18 VITALS — BP 101/90 | HR 111 | Wt 195.2 lb

## 2016-01-18 DIAGNOSIS — F4310 Post-traumatic stress disorder, unspecified: Secondary | ICD-10-CM

## 2016-01-18 MED ORDER — ESCITALOPRAM OXALATE 20 MG PO TABS: 20.0000 mg | ORAL_TABLET | Freq: Every day | ORAL | 2 refills | Status: DC
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
## 2016-01-19 ENCOUNTER — Ambulatory Visit (INDEPENDENT_AMBULATORY_CARE_PROVIDER_SITE_OTHER): Payer: Medicare Other | Admitting: Psychology

## 2016-01-19 DIAGNOSIS — F4310 Post-traumatic stress disorder, unspecified: Secondary | ICD-10-CM

## 2016-01-19 NOTE — Progress Notes (Signed)
Here

## 2016-01-25 ENCOUNTER — Encounter (HOSPITAL_COMMUNITY): Payer: Self-pay | Admitting: Psychology

## 2016-02-09 ENCOUNTER — Encounter (HOSPITAL_COMMUNITY): Payer: Self-pay | Admitting: Psychology

## 2016-02-09 ENCOUNTER — Ambulatory Visit (INDEPENDENT_AMBULATORY_CARE_PROVIDER_SITE_OTHER): Payer: Medicare Other | Admitting: Psychology

## 2016-02-09 DIAGNOSIS — F4310 Post-traumatic stress disorder, unspecified: Secondary | ICD-10-CM | POA: Diagnosis not present

## 2016-02-09 NOTE — Progress Notes (Signed)
  The patient returns today.  He reports that he has been working on his coping skills we talked about on our first session.  He has been up for sunrise and sunset and has been going out walking and eating well.  We worked on Occupational psychologist today and working on how to apply these.  The patient reports that he is doing better and his mood and perspective have all improved.  He has been more motivated and engaged and has been having less intrusive thoughts and flashbacks.  We also started laying the ground work for systematic desensitization efforts.

## 2016-02-16 ENCOUNTER — Encounter (HOSPITAL_COMMUNITY): Payer: Self-pay | Admitting: Psychiatry

## 2016-02-16 ENCOUNTER — Ambulatory Visit (INDEPENDENT_AMBULATORY_CARE_PROVIDER_SITE_OTHER): Payer: Medicare Other | Admitting: Psychiatry

## 2016-02-16 VITALS — BP 161/102 | HR 93 | Ht 69.0 in | Wt 182.6 lb

## 2016-02-16 DIAGNOSIS — F17210 Nicotine dependence, cigarettes, uncomplicated: Secondary | ICD-10-CM

## 2016-02-16 DIAGNOSIS — Z79899 Other long term (current) drug therapy: Secondary | ICD-10-CM | POA: Diagnosis not present

## 2016-02-16 DIAGNOSIS — Z8249 Family history of ischemic heart disease and other diseases of the circulatory system: Secondary | ICD-10-CM | POA: Diagnosis not present

## 2016-02-16 DIAGNOSIS — F4310 Post-traumatic stress disorder, unspecified: Secondary | ICD-10-CM

## 2016-02-16 MED ORDER — DIAZEPAM 5 MG PO TABS: ORAL_TABLET | ORAL | 2 refills | Status: DC

## 2016-02-16 MED ORDER — DULOXETINE HCL 60 MG PO CPEP: 60.0000 mg | ORAL_CAPSULE | Freq: Every day | ORAL | 2 refills | Status: DC

## 2016-02-16 MED ORDER — PRAZOSIN HCL 5 MG PO CAPS: 5.0000 mg | ORAL_CAPSULE | Freq: Every day | ORAL | 2 refills | Status: DC
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
## 2016-03-13 ENCOUNTER — Ambulatory Visit (HOSPITAL_COMMUNITY): Payer: Self-pay | Admitting: Psychiatry

## 2016-04-30 ENCOUNTER — Other Ambulatory Visit (HOSPITAL_COMMUNITY): Payer: Self-pay | Admitting: Psychiatry

## 2016-05-15 ENCOUNTER — Ambulatory Visit (INDEPENDENT_AMBULATORY_CARE_PROVIDER_SITE_OTHER): Payer: Medicare Other | Admitting: Psychiatry

## 2016-05-15 ENCOUNTER — Encounter (HOSPITAL_COMMUNITY): Payer: Self-pay | Admitting: Psychiatry

## 2016-05-15 VITALS — BP 100/71 | HR 80 | Ht 69.0 in | Wt 184.0 lb

## 2016-05-15 DIAGNOSIS — F4310 Post-traumatic stress disorder, unspecified: Secondary | ICD-10-CM | POA: Diagnosis not present

## 2016-05-15 DIAGNOSIS — Z9889 Other specified postprocedural states: Secondary | ICD-10-CM | POA: Diagnosis not present

## 2016-05-15 DIAGNOSIS — Z8249 Family history of ischemic heart disease and other diseases of the circulatory system: Secondary | ICD-10-CM

## 2016-05-15 DIAGNOSIS — Z79891 Long term (current) use of opiate analgesic: Secondary | ICD-10-CM

## 2016-05-15 DIAGNOSIS — F17210 Nicotine dependence, cigarettes, uncomplicated: Secondary | ICD-10-CM | POA: Diagnosis not present

## 2016-05-15 DIAGNOSIS — Z7982 Long term (current) use of aspirin: Secondary | ICD-10-CM

## 2016-05-15 DIAGNOSIS — Z79899 Other long term (current) drug therapy: Secondary | ICD-10-CM

## 2016-05-15 MED ORDER — DIAZEPAM 5 MG PO TABS: ORAL_TABLET | ORAL | 2 refills | Status: DC

## 2016-05-15 MED ORDER — ESCITALOPRAM OXALATE 20 MG PO TABS: 20.0000 mg | ORAL_TABLET | Freq: Every day | ORAL | 2 refills | Status: DC

## 2016-05-15 MED ORDER — PRAZOSIN HCL 5 MG PO CAPS: 5.0000 mg | ORAL_CAPSULE | Freq: Every day | ORAL | 2 refills | Status: DC
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
## 2016-05-26 ENCOUNTER — Other Ambulatory Visit (HOSPITAL_COMMUNITY): Payer: Self-pay | Admitting: Psychiatry

## 2016-05-30 ENCOUNTER — Other Ambulatory Visit (HOSPITAL_COMMUNITY): Payer: Self-pay | Admitting: Psychiatry

## 2016-06-26 ENCOUNTER — Other Ambulatory Visit (HOSPITAL_COMMUNITY): Payer: Self-pay | Admitting: Psychiatry

## 2016-07-13 ENCOUNTER — Ambulatory Visit (INDEPENDENT_AMBULATORY_CARE_PROVIDER_SITE_OTHER): Payer: Medicare Other | Admitting: Psychiatry

## 2016-07-13 ENCOUNTER — Encounter (HOSPITAL_COMMUNITY): Payer: Self-pay | Admitting: Psychiatry

## 2016-07-13 VITALS — BP 129/87 | HR 52 | Ht 69.0 in | Wt 183.0 lb

## 2016-07-13 DIAGNOSIS — Z79899 Other long term (current) drug therapy: Secondary | ICD-10-CM | POA: Diagnosis not present

## 2016-07-13 DIAGNOSIS — F4310 Post-traumatic stress disorder, unspecified: Secondary | ICD-10-CM

## 2016-07-13 DIAGNOSIS — Z79891 Long term (current) use of opiate analgesic: Secondary | ICD-10-CM | POA: Diagnosis not present

## 2016-07-13 DIAGNOSIS — F17210 Nicotine dependence, cigarettes, uncomplicated: Secondary | ICD-10-CM

## 2016-07-13 MED ORDER — ESCITALOPRAM OXALATE 20 MG PO TABS: 20.0000 mg | ORAL_TABLET | Freq: Every day | ORAL | 2 refills | Status: DC

## 2016-07-13 MED ORDER — PRAZOSIN HCL 5 MG PO CAPS: 5.0000 mg | ORAL_CAPSULE | Freq: Every day | ORAL | 2 refills | Status: DC

## 2016-07-13 MED ORDER — DIAZEPAM 5 MG PO TABS: ORAL_TABLET | ORAL | 2 refills | Status: DC
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
## 2016-08-08 ENCOUNTER — Other Ambulatory Visit: Payer: Self-pay | Admitting: Nurse Practitioner

## 2016-09-03 ENCOUNTER — Ambulatory Visit (INDEPENDENT_AMBULATORY_CARE_PROVIDER_SITE_OTHER): Payer: Medicare Other | Admitting: Psychiatry

## 2016-09-03 ENCOUNTER — Encounter (HOSPITAL_COMMUNITY): Payer: Self-pay | Admitting: Psychiatry

## 2016-09-03 VITALS — BP 136/82 | HR 70 | Ht 69.0 in | Wt 181.0 lb

## 2016-09-03 DIAGNOSIS — F17210 Nicotine dependence, cigarettes, uncomplicated: Secondary | ICD-10-CM | POA: Diagnosis not present

## 2016-09-03 DIAGNOSIS — F4310 Post-traumatic stress disorder, unspecified: Secondary | ICD-10-CM

## 2016-09-03 DIAGNOSIS — Z79891 Long term (current) use of opiate analgesic: Secondary | ICD-10-CM

## 2016-09-03 DIAGNOSIS — Z79899 Other long term (current) drug therapy: Secondary | ICD-10-CM

## 2016-09-03 MED ORDER — DIAZEPAM 5 MG PO TABS: ORAL_TABLET | ORAL | 2 refills | Status: DC

## 2016-09-03 MED ORDER — PRAZOSIN HCL 5 MG PO CAPS: 5.0000 mg | ORAL_CAPSULE | Freq: Every day | ORAL | 2 refills | Status: DC

## 2016-09-03 MED ORDER — ESCITALOPRAM OXALATE 20 MG PO TABS: 20.0000 mg | ORAL_TABLET | Freq: Every day | ORAL | 2 refills | Status: DC
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
## 2016-09-19 ENCOUNTER — Telehealth (HOSPITAL_COMMUNITY): Payer: Self-pay | Admitting: *Deleted

## 2016-09-19 NOTE — Telephone Encounter (Signed)
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  He said he needs to be seen.

## 2016-09-20 ENCOUNTER — Ambulatory Visit (INDEPENDENT_AMBULATORY_CARE_PROVIDER_SITE_OTHER): Payer: Medicare Other | Admitting: Psychiatry

## 2016-09-20 ENCOUNTER — Encounter (HOSPITAL_COMMUNITY): Payer: Self-pay | Admitting: Psychiatry

## 2016-09-20 VITALS — BP 135/75 | HR 73 | Resp 16 | Wt 182.4 lb

## 2016-09-20 DIAGNOSIS — F4310 Post-traumatic stress disorder, unspecified: Secondary | ICD-10-CM | POA: Diagnosis not present

## 2016-09-20 DIAGNOSIS — F17210 Nicotine dependence, cigarettes, uncomplicated: Secondary | ICD-10-CM | POA: Diagnosis not present

## 2016-09-20 MED ORDER — ESCITALOPRAM OXALATE 20 MG PO TABS: 20.0000 mg | ORAL_TABLET | Freq: Two times a day (BID) | ORAL | 2 refills | Status: DC

## 2016-09-20 MED ORDER — QUETIAPINE FUMARATE 100 MG PO TABS: 100.0000 mg | ORAL_TABLET | Freq: Every day | ORAL | 2 refills | Status: DC

## 2016-09-20 MED ORDER — DIAZEPAM 10 MG PO TABS: ORAL_TABLET | ORAL | 2 refills | Status: DC
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
## 2016-09-24 ENCOUNTER — Ambulatory Visit (INDEPENDENT_AMBULATORY_CARE_PROVIDER_SITE_OTHER): Payer: Medicare Other | Admitting: Licensed Clinical Social Worker

## 2016-09-24 DIAGNOSIS — F4310 Post-traumatic stress disorder, unspecified: Secondary | ICD-10-CM

## 2016-09-25 ENCOUNTER — Encounter (HOSPITAL_COMMUNITY): Payer: Self-pay | Admitting: Licensed Clinical Social Worker
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
## 2016-10-01 ENCOUNTER — Ambulatory Visit (HOSPITAL_COMMUNITY): Payer: Self-pay | Admitting: Licensed Clinical Social Worker

## 2016-10-03 ENCOUNTER — Ambulatory Visit (INDEPENDENT_AMBULATORY_CARE_PROVIDER_SITE_OTHER): Payer: Medicare Other | Admitting: Licensed Clinical Social Worker

## 2016-10-03 ENCOUNTER — Encounter (HOSPITAL_COMMUNITY): Payer: Self-pay | Admitting: Licensed Clinical Social Worker

## 2016-10-03 ENCOUNTER — Other Ambulatory Visit (HOSPITAL_COMMUNITY): Payer: Self-pay | Admitting: Psychiatry

## 2016-10-03 DIAGNOSIS — F321 Major depressive disorder, single episode, moderate: Secondary | ICD-10-CM

## 2016-10-03 DIAGNOSIS — F4310 Post-traumatic stress disorder, unspecified: Secondary | ICD-10-CM
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
## 2016-10-04 ENCOUNTER — Ambulatory Visit (INDEPENDENT_AMBULATORY_CARE_PROVIDER_SITE_OTHER): Payer: Medicare Other | Admitting: Psychiatry

## 2016-10-04 ENCOUNTER — Encounter (HOSPITAL_COMMUNITY): Payer: Self-pay | Admitting: Psychiatry

## 2016-10-04 VITALS — BP 144/80 | HR 72 | Ht 69.0 in | Wt 181.0 lb

## 2016-10-04 DIAGNOSIS — Z79899 Other long term (current) drug therapy: Secondary | ICD-10-CM | POA: Diagnosis not present

## 2016-10-04 DIAGNOSIS — Z7982 Long term (current) use of aspirin: Secondary | ICD-10-CM

## 2016-10-04 DIAGNOSIS — Z79891 Long term (current) use of opiate analgesic: Secondary | ICD-10-CM

## 2016-10-04 DIAGNOSIS — F17210 Nicotine dependence, cigarettes, uncomplicated: Secondary | ICD-10-CM

## 2016-10-04 DIAGNOSIS — F4310 Post-traumatic stress disorder, unspecified: Secondary | ICD-10-CM | POA: Diagnosis not present
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
## 2016-10-04 NOTE — Patient Instructions (Signed)
Cut valium back to one half tablet twice a day and one or two at night

## 2016-10-10 ENCOUNTER — Ambulatory Visit (INDEPENDENT_AMBULATORY_CARE_PROVIDER_SITE_OTHER): Payer: Medicare Other | Admitting: Licensed Clinical Social Worker

## 2016-10-10 ENCOUNTER — Encounter (HOSPITAL_COMMUNITY): Payer: Self-pay | Admitting: Licensed Clinical Social Worker

## 2016-10-10 DIAGNOSIS — F4310 Post-traumatic stress disorder, unspecified: Secondary | ICD-10-CM | POA: Diagnosis not present
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
## 2016-10-17 ENCOUNTER — Ambulatory Visit (HOSPITAL_COMMUNITY): Payer: Self-pay | Admitting: Licensed Clinical Social Worker

## 2016-10-25 ENCOUNTER — Encounter: Payer: Self-pay | Admitting: Internal Medicine

## 2016-10-29 ENCOUNTER — Encounter (HOSPITAL_COMMUNITY): Payer: Self-pay | Admitting: Licensed Clinical Social Worker

## 2016-10-29 ENCOUNTER — Ambulatory Visit (INDEPENDENT_AMBULATORY_CARE_PROVIDER_SITE_OTHER): Payer: Medicare Other | Admitting: Licensed Clinical Social Worker

## 2016-10-29 DIAGNOSIS — F4310 Post-traumatic stress disorder, unspecified: Secondary | ICD-10-CM | POA: Diagnosis not present
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
## 2016-11-02 ENCOUNTER — Encounter (HOSPITAL_COMMUNITY): Payer: Self-pay | Admitting: Psychiatry

## 2016-11-02 ENCOUNTER — Ambulatory Visit (INDEPENDENT_AMBULATORY_CARE_PROVIDER_SITE_OTHER): Payer: Medicare Other | Admitting: Psychiatry

## 2016-11-02 VITALS — BP 132/78 | HR 54 | Resp 15 | Wt 179.2 lb

## 2016-11-02 DIAGNOSIS — F4310 Post-traumatic stress disorder, unspecified: Secondary | ICD-10-CM | POA: Diagnosis not present

## 2016-11-02 DIAGNOSIS — F17210 Nicotine dependence, cigarettes, uncomplicated: Secondary | ICD-10-CM | POA: Diagnosis not present

## 2016-11-02 DIAGNOSIS — G4700 Insomnia, unspecified: Secondary | ICD-10-CM | POA: Diagnosis not present

## 2016-11-02 MED ORDER — PRAZOSIN HCL 5 MG PO CAPS: 5.0000 mg | ORAL_CAPSULE | Freq: Every day | ORAL | 2 refills | Status: DC

## 2016-11-02 MED ORDER — ESCITALOPRAM OXALATE 20 MG PO TABS: 20.0000 mg | ORAL_TABLET | Freq: Two times a day (BID) | ORAL | 2 refills | Status: DC
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
## 2016-11-02 NOTE — Progress Notes (Signed)
Pt states he has decreased his HS dose of Valium to 5mg . States he doesn't feel as groggy/sleepy and has has better energy.

## 2016-11-12 ENCOUNTER — Encounter (HOSPITAL_COMMUNITY): Payer: Self-pay | Admitting: Licensed Clinical Social Worker

## 2016-11-12 ENCOUNTER — Ambulatory Visit (INDEPENDENT_AMBULATORY_CARE_PROVIDER_SITE_OTHER): Payer: Medicare Other | Admitting: Licensed Clinical Social Worker

## 2016-11-12 DIAGNOSIS — F4310 Post-traumatic stress disorder, unspecified: Secondary | ICD-10-CM | POA: Diagnosis not present
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
## 2016-12-06 ENCOUNTER — Ambulatory Visit (INDEPENDENT_AMBULATORY_CARE_PROVIDER_SITE_OTHER): Payer: Medicare Other | Admitting: Licensed Clinical Social Worker

## 2016-12-06 DIAGNOSIS — F4310 Post-traumatic stress disorder, unspecified: Secondary | ICD-10-CM
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
## 2016-12-21 ENCOUNTER — Other Ambulatory Visit (HOSPITAL_COMMUNITY): Payer: Self-pay | Admitting: Psychiatry

## 2016-12-22 ENCOUNTER — Emergency Department (HOSPITAL_COMMUNITY): Payer: Medicare Other

## 2016-12-22 ENCOUNTER — Encounter (HOSPITAL_COMMUNITY): Payer: Self-pay | Admitting: Cardiology

## 2016-12-22 ENCOUNTER — Emergency Department (HOSPITAL_COMMUNITY)
Admission: EM | Admit: 2016-12-22 | Discharge: 2016-12-22 | Disposition: A | Discharge status: Home / Self Care | Payer: Medicare Other | Source: Home / Self Care | Attending: Emergency Medicine | Admitting: Emergency Medicine

## 2016-12-22 DIAGNOSIS — Z79899 Other long term (current) drug therapy: Secondary | ICD-10-CM | POA: Insufficient documentation

## 2016-12-22 DIAGNOSIS — S50311A Abrasion of right elbow, initial encounter: Secondary | ICD-10-CM | POA: Insufficient documentation

## 2016-12-22 DIAGNOSIS — Y939 Activity, unspecified: Secondary | ICD-10-CM | POA: Insufficient documentation

## 2016-12-22 DIAGNOSIS — Y999 Unspecified external cause status: Secondary | ICD-10-CM

## 2016-12-22 DIAGNOSIS — I10 Essential (primary) hypertension: Secondary | ICD-10-CM | POA: Insufficient documentation

## 2016-12-22 DIAGNOSIS — Y929 Unspecified place or not applicable: Secondary | ICD-10-CM | POA: Insufficient documentation

## 2016-12-22 DIAGNOSIS — Z23 Encounter for immunization: Secondary | ICD-10-CM

## 2016-12-22 DIAGNOSIS — S80212A Abrasion, left knee, initial encounter: Secondary | ICD-10-CM

## 2016-12-22 DIAGNOSIS — Z7982 Long term (current) use of aspirin: Secondary | ICD-10-CM | POA: Insufficient documentation

## 2016-12-22 DIAGNOSIS — W010XXA Fall on same level from slipping, tripping and stumbling without subsequent striking against object, initial encounter: Secondary | ICD-10-CM | POA: Insufficient documentation

## 2016-12-22 DIAGNOSIS — F17210 Nicotine dependence, cigarettes, uncomplicated: Secondary | ICD-10-CM

## 2016-12-22 DIAGNOSIS — W19XXXA Unspecified fall, initial encounter: Secondary | ICD-10-CM

## 2016-12-22 HISTORY — DX: Post-traumatic stress disorder, unspecified: F43.10

## 2016-12-22 MED ORDER — TETANUS-DIPHTH-ACELL PERTUSSIS 5-2.5-18.5 LF-MCG/0.5 IM SUSP
0.5000 mL | Freq: Once | INTRAMUSCULAR | Status: AC
  Administered 2016-12-22: 0.5 mL via INTRAMUSCULAR
  Filled 2016-12-22: qty 0.5

## 2016-12-22 MED ORDER — BACITRACIN-NEOMYCIN-POLYMYXIN 400-5-5000 EX OINT: TOPICAL_OINTMENT | Freq: Once | CUTANEOUS | Status: DC

## 2016-12-22 MED ORDER — IBUPROFEN 800 MG PO TABS: 800.0000 mg | ORAL_TABLET | Freq: Three times a day (TID) | ORAL | 0 refills | Status: DC | PRN

## 2016-12-22 MED ORDER — BACITRACIN ZINC 500 UNIT/GM EX OINT
TOPICAL_OINTMENT | CUTANEOUS | Status: AC
  Administered 2016-12-22: 18:00:00
  Filled 2016-12-22: qty 0.9

## 2016-12-22 NOTE — ED Provider Notes (Signed)
[facility] DEPT Provider Note   CSN: [id number] Arrival date & time: 12/22/16  1702     History   Chief Complaint Chief Complaint  Patient presents with  . Fall    HPI Perry Cervantes is a 66 y.o. male.  Patient states he slipped and fell and hurt his right elbow and left knee no loss of consciousness or head injury   The history is provided by the patient.  Fall  This is a recurrent problem. The problem occurs rarely. The problem has been resolved. Pertinent negatives include no chest pain, no abdominal pain and no headaches. Nothing aggravates the symptoms.    Past Medical History:  Diagnosis Date  . Anxiety   . Chronic back pain    spondylolisthesis,spondylosis,scoliosis,and radiculopathy  . Chronic pain    secondary to back surgery  . Fibromyalgia    takes Lyrica bid  . Gastroesophageal reflux disease    takes Protonix daily  . History of colon polyps   . History of kidney stones   . Hypertension    takes Amlodipine,Ziac, and Lisinopril daily  . Mild depression ([place])   . PTSD (post-traumatic stress disorder)   . Spondylolysis of lumbar region     Patient Active Problem List   Diagnosis Date Noted  . Lumbar scoliosis 05/14/2013  . Heme positive stool 03/24/2012  . GERD (gastroesophageal reflux disease) 03/24/2012    Past Surgical History:  Procedure Laterality Date  . BACK SURGERY     x 2  . COLONOSCOPY  03/13/2005   RMR: Normal rectum. Diminutive polyp at hepatic flexure cold biopsied/removed (inflamed, benign). Otherwise normal colon, normal terminal ileum  . COLONOSCOPY N/A 07/28/2012   RMR: Colonic diverticulosis. diminutive colonic polyps (hyerplastic)-removed as described above. Colonic erosion ulceration ad described above. Status post biopsy. I suspect NSAID insult to his colon rather than inflammatory bowel disease . These finding could easily explain Hemocult-positve stool. EGD not needed at this time. Next TCS 07/2022.  .  ESOPHAGOGASTRODUODENOSCOPY  03/13/2005   RMR: Small hiatal hernia  . NECK SURGERY  2008   fusion  . right knee arthroscopy     x 2       Home Medications    Prior to Admission medications   Medication Sig Start Date End Date Taking? Authorizing Provider  amLODipine (NORVASC) 10 MG tablet Take 10 mg by mouth daily.  03/06/12   [provider]  aspirin 81 MG tablet Take 81 mg by mouth daily.    [provider]  bisoprolol-hydrochlorothiazide (ZIAC) 10-6.25 MG per tablet Take 1 tablet by mouth daily.  03/06/12   [provider]  cyclobenzaprine (FLEXERIL) 10 MG tablet Take 10 mg by mouth 2 (two) times daily as needed for muscle spasms.  03/08/12   [provider]  diazepam (VALIUM) 10 MG tablet Take one twice a day and two at bedtime Patient taking differently: Take one twice a day and two at bedtime 09/20/16   Cloria Spring, MD  escitalopram (LEXAPRO) 20 MG tablet Take 1 tablet (20 mg total) by mouth 2 (two) times daily. 11/02/16 11/02/17  Cloria Spring, MD  gabapentin (NEURONTIN) 300 MG capsule Take 300 mg by mouth 3 (three) times daily.  12/08/14   [provider]  ibuprofen (ADVIL,MOTRIN) 800 MG tablet Take 1 tablet (800 mg total) by mouth every 8 (eight) hours as needed for moderate pain. 12/22/16   Milton Ferguson, MD  lisinopril (PRINIVIL,ZESTRIL) 5 MG tablet Take 5 mg by  mouth daily.  03/06/12   [provider]  loratadine (CLARITIN) 10 MG tablet Take 10 mg by mouth daily as needed for allergies.    [provider]  oxyCODONE-acetaminophen (PERCOCET) 10-325 MG per tablet Take 1 tablet by mouth every 4 (four) hours as needed for pain. 05/17/13   Consuella Lose, MD  pantoprazole (PROTONIX) 40 MG tablet TAKE (1) TABLET BY MOUTH ONCE DAILY. 08/08/16   Carlis Stable, NP  prazosin (MINIPRESS) 5 MG capsule Take 1 capsule (5 mg total) by mouth at bedtime. 11/02/16   Cloria Spring, MD  ranitidine (ZANTAC) 150 MG capsule Take 150  mg by mouth 2 (two) times daily.    [provider]    Family History Family History  Problem Relation Age of Onset  . Heart attack Mother   . Other Father        deceased age 58 of Staph sepsis  . Colon cancer Neg Hx     Social History Social History  Substance Use Topics  . Smoking status: Current Every Day Smoker    Packs/day: 1.00    Years: 25.00    Types: Cigarettes  . Smokeless tobacco: Never Used  . Alcohol use Yes     Comment: occasional      Allergies   Patient has no known allergies.   Review of Systems Review of Systems  Constitutional: Negative for appetite change and fatigue.  HENT: Negative for congestion, ear discharge and sinus pressure.   Eyes: Negative for discharge.  Respiratory: Negative for cough.   Cardiovascular: Negative for chest pain.  Gastrointestinal: Negative for abdominal pain and diarrhea.  Genitourinary: Negative for frequency and hematuria.  Musculoskeletal: Negative for back pain.       Left knee and right elbow pain  Skin: Negative for rash.  Neurological: Negative for seizures and headaches.  Psychiatric/Behavioral: Negative for hallucinations.     Physical Exam Updated Vital Signs BP (!) 143/76 (BP Location: Left Arm)   Pulse 73   Temp 98.7 F (37.1 C) (Oral)   Resp 18   Ht 5\' 9"  (1.753 m)   Wt 79.4 kg (175 lb)   SpO2 97%   BMI 25.84 kg/m   Physical Exam  Constitutional: He is oriented to person, place, and time. He appears well-developed.  HENT:  Head: Normocephalic.  Eyes: Conjunctivae and EOM are normal. No scleral icterus.  Neck: Neck supple. No thyromegaly present.  Cardiovascular: Normal rate and regular rhythm.  Exam reveals no gallop and no friction rub.   No murmur heard. Pulmonary/Chest: No stridor. He has no wheezes. He has no rales. He exhibits no tenderness.  Abdominal: He exhibits no distension. There is no tenderness. There is no rebound.  Musculoskeletal: Normal range of motion. He  exhibits no edema.  Mild tenderness and abrasion to right elbow and left knee  Lymphadenopathy:    He has no cervical adenopathy.  Neurological: He is oriented to person, place, and time. He exhibits normal muscle tone. Coordination normal.  Skin: No rash noted. No erythema.  Psychiatric: He has a normal mood and affect. His behavior is normal.     ED Treatments / Results  Labs (all labs ordered are listed, but only abnormal results are displayed) Labs Reviewed - No data to display  EKG  EKG Interpretation None       Radiology Dg Elbow Complete Right (3+view)  Result Date: 12/22/2016 CLINICAL DATA:  Right elbow abrasion, fall EXAM: RIGHT ELBOW - COMPLETE 3+ VIEW  COMPARISON:  None. FINDINGS: There is no evidence of fracture, dislocation, or joint effusion. There is no evidence of arthropathy or other focal bone abnormality. Soft tissues are unremarkable. IMPRESSION: Negative. Electronically Signed   By: Donavan Foil M.D.   On: 12/22/2016 18:10   Dg Knee Complete 4 Views Left  Result Date: 12/22/2016 CLINICAL DATA:  Fall with red and warm knee EXAM: LEFT KNEE - COMPLETE 4+ VIEW COMPARISON:  None. FINDINGS: No fracture or malalignment. No large effusion. Mild patellofemoral degenerative changes. Mild changes of the medial compartment. IMPRESSION: Mild degenerative changes.  No acute osseous abnormality. Electronically Signed   By: Donavan Foil M.D.   On: 12/22/2016 18:42    Procedures Procedures (including critical care time)  Medications Ordered in ED Medications  bacitracin 500 UNIT/GM ointment (not administered)  Tdap (BOOSTRIX) injection 0.5 mL (0.5 mLs Intramuscular Given 12/22/16 1732)     Initial Impression / Assessment and Plan / ED Course  I have reviewed the triage vital signs and the nursing notes.  Pertinent labs & imaging results that were available during my care of the patient were reviewed by me and considered in my medical decision making (see chart for  details).     Contusion to right elbow left knee patient will follow-up with PCP as needed  Final Clinical Impressions(s) / ED Diagnoses   Final diagnoses:  Fall, initial encounter    New Prescriptions New Prescriptions   IBUPROFEN (ADVIL,MOTRIN) 800 MG TABLET    Take 1 tablet (800 mg total) by mouth every 8 (eight) hours as needed for moderate pain.     Milton Ferguson, MD 12/22/16 1901

## 2016-12-22 NOTE — Discharge Instructions (Signed)
Follow up with your md if problems °

## 2016-12-22 NOTE — ED Notes (Signed)
Pt state she has been falling a lot lately.  Healing laceration to left upper arm.

## 2016-12-22 NOTE — ED Notes (Signed)
Pt points to nurse his L knee which has a healing abrasion on it and asks for antobiotic and dressing  Wound is scabbed and several days old

## 2016-12-22 NOTE — ED Triage Notes (Signed)
Pt has had several falls today.  C/o  right arm pain  ,  States he fell on his right side this morning.

## 2016-12-23 ENCOUNTER — Inpatient Hospital Stay (HOSPITAL_COMMUNITY)
Admission: EM | Admit: 2016-12-23 | Discharge: 2016-12-26 | DRG: 064 | Disposition: A | Discharge status: Rehabilitation Facility | Payer: Medicare Other | Attending: Internal Medicine | Admitting: Internal Medicine

## 2016-12-23 ENCOUNTER — Emergency Department (HOSPITAL_COMMUNITY): Payer: Medicare Other

## 2016-12-23 ENCOUNTER — Encounter (HOSPITAL_COMMUNITY): Payer: Self-pay | Admitting: Cardiology

## 2016-12-23 ENCOUNTER — Observation Stay (HOSPITAL_COMMUNITY): Payer: Medicare Other

## 2016-12-23 DIAGNOSIS — S80212A Abrasion, left knee, initial encounter: Secondary | ICD-10-CM | POA: Diagnosis present

## 2016-12-23 DIAGNOSIS — E785 Hyperlipidemia, unspecified: Secondary | ICD-10-CM | POA: Diagnosis not present

## 2016-12-23 DIAGNOSIS — R296 Repeated falls: Secondary | ICD-10-CM | POA: Diagnosis not present

## 2016-12-23 DIAGNOSIS — M549 Dorsalgia, unspecified: Secondary | ICD-10-CM | POA: Diagnosis not present

## 2016-12-23 DIAGNOSIS — G8191 Hemiplegia, unspecified affecting right dominant side: Secondary | ICD-10-CM

## 2016-12-23 DIAGNOSIS — F32A Depression, unspecified: Secondary | ICD-10-CM | POA: Diagnosis present

## 2016-12-23 DIAGNOSIS — M797 Fibromyalgia: Secondary | ICD-10-CM | POA: Diagnosis not present

## 2016-12-23 DIAGNOSIS — Z23 Encounter for immunization: Secondary | ICD-10-CM

## 2016-12-23 DIAGNOSIS — Z981 Arthrodesis status: Secondary | ICD-10-CM | POA: Diagnosis not present

## 2016-12-23 DIAGNOSIS — D72829 Elevated white blood cell count, unspecified: Secondary | ICD-10-CM

## 2016-12-23 DIAGNOSIS — R531 Weakness: Secondary | ICD-10-CM | POA: Diagnosis not present

## 2016-12-23 DIAGNOSIS — G8929 Other chronic pain: Secondary | ICD-10-CM | POA: Diagnosis not present

## 2016-12-23 DIAGNOSIS — I10 Essential (primary) hypertension: Secondary | ICD-10-CM | POA: Diagnosis not present

## 2016-12-23 DIAGNOSIS — K219 Gastro-esophageal reflux disease without esophagitis: Secondary | ICD-10-CM | POA: Diagnosis not present

## 2016-12-23 DIAGNOSIS — F419 Anxiety disorder, unspecified: Secondary | ICD-10-CM | POA: Diagnosis present

## 2016-12-23 DIAGNOSIS — M6282 Rhabdomyolysis: Secondary | ICD-10-CM | POA: Diagnosis not present

## 2016-12-23 DIAGNOSIS — I69331 Monoplegia of upper limb following cerebral infarction affecting right dominant side: Secondary | ICD-10-CM

## 2016-12-23 DIAGNOSIS — I447 Left bundle-branch block, unspecified: Secondary | ICD-10-CM | POA: Diagnosis not present

## 2016-12-23 DIAGNOSIS — F4323 Adjustment disorder with mixed anxiety and depressed mood: Secondary | ICD-10-CM

## 2016-12-23 DIAGNOSIS — N179 Acute kidney failure, unspecified: Secondary | ICD-10-CM | POA: Diagnosis not present

## 2016-12-23 DIAGNOSIS — R05 Cough: Secondary | ICD-10-CM

## 2016-12-23 DIAGNOSIS — J189 Pneumonia, unspecified organism: Secondary | ICD-10-CM | POA: Diagnosis present

## 2016-12-23 DIAGNOSIS — M792 Neuralgia and neuritis, unspecified: Secondary | ICD-10-CM

## 2016-12-23 DIAGNOSIS — S50311A Abrasion of right elbow, initial encounter: Secondary | ICD-10-CM | POA: Diagnosis present

## 2016-12-23 DIAGNOSIS — F329 Major depressive disorder, single episode, unspecified: Secondary | ICD-10-CM | POA: Diagnosis not present

## 2016-12-23 DIAGNOSIS — F17210 Nicotine dependence, cigarettes, uncomplicated: Secondary | ICD-10-CM | POA: Diagnosis not present

## 2016-12-23 DIAGNOSIS — R059 Cough, unspecified: Secondary | ICD-10-CM

## 2016-12-23 DIAGNOSIS — W19XXXA Unspecified fall, initial encounter: Secondary | ICD-10-CM

## 2016-12-23 DIAGNOSIS — Z7982 Long term (current) use of aspirin: Secondary | ICD-10-CM | POA: Diagnosis not present

## 2016-12-23 DIAGNOSIS — F4310 Post-traumatic stress disorder, unspecified: Secondary | ICD-10-CM | POA: Diagnosis not present

## 2016-12-23 DIAGNOSIS — F4312 Post-traumatic stress disorder, chronic: Secondary | ICD-10-CM | POA: Diagnosis present

## 2016-12-23 DIAGNOSIS — Z79899 Other long term (current) drug therapy: Secondary | ICD-10-CM

## 2016-12-23 DIAGNOSIS — I639 Cerebral infarction, unspecified: Secondary | ICD-10-CM | POA: Diagnosis not present

## 2016-12-23 DIAGNOSIS — W010XXA Fall on same level from slipping, tripping and stumbling without subsequent striking against object, initial encounter: Secondary | ICD-10-CM | POA: Diagnosis present

## 2016-12-23 DIAGNOSIS — Z720 Tobacco use: Secondary | ICD-10-CM

## 2016-12-23 DIAGNOSIS — R079 Chest pain, unspecified: Secondary | ICD-10-CM

## 2016-12-23 LAB — COMPREHENSIVE METABOLIC PANEL
ALBUMIN: 4.1 g/dL (ref 3.5–5.0)
ALK PHOS: 70 U/L (ref 38–126)
ALT: 58 U/L (ref 17–63)
ANION GAP: 14 (ref 5–15)
AST: 198 U/L — ABNORMAL HIGH (ref 15–41)
BILIRUBIN TOTAL: 1.5 mg/dL — AB (ref 0.3–1.2)
BUN: 39 mg/dL — ABNORMAL HIGH (ref 6–20)
CALCIUM: 8.8 mg/dL — AB (ref 8.9–10.3)
CO2: 19 mmol/L — ABNORMAL LOW (ref 22–32)
Chloride: 103 mmol/L (ref 101–111)
Creatinine, Ser: 2.16 mg/dL — ABNORMAL HIGH (ref 0.61–1.24)
GFR calc non Af Amer: 31 mL/min — ABNORMAL LOW (ref >60)
GFR, EST AFRICAN AMERICAN: 35 mL/min — AB (ref >60)
GLUCOSE: 93 mg/dL (ref 65–99)
Potassium: 4.7 mmol/L (ref 3.5–5.1)
Sodium: 136 mmol/L (ref 135–145)
TOTAL PROTEIN: 6.5 g/dL (ref 6.5–8.1)

## 2016-12-23 LAB — CBC WITH DIFFERENTIAL/PLATELET
Basophils Absolute: 0 10*3/uL (ref 0.0–0.1)
Basophils Relative: 0 %
EOS PCT: 2 %
Eosinophils Absolute: 0.4 10*3/uL (ref 0.0–0.7)
HEMATOCRIT: 44.3 % (ref 39.0–52.0)
Hemoglobin: 15.6 g/dL (ref 13.0–17.0)
LYMPHS ABS: 3.1 10*3/uL (ref 0.7–4.0)
LYMPHS PCT: 19 %
MCH: 34.7 pg — AB (ref 26.0–34.0)
MCHC: 35.2 g/dL (ref 30.0–36.0)
MCV: 98.4 fL (ref 78.0–100.0)
MONO ABS: 0.8 10*3/uL (ref 0.1–1.0)
Monocytes Relative: 5 %
NEUTROS ABS: 11.9 10*3/uL — AB (ref 1.7–7.7)
Neutrophils Relative %: 74 %
PLATELETS: 249 10*3/uL (ref 150–400)
RBC: 4.5 MIL/uL (ref 4.22–5.81)
RDW: 13.5 % (ref 11.5–15.5)
WBC: 16.2 10*3/uL — ABNORMAL HIGH (ref 4.0–10.5)

## 2016-12-23 LAB — ETHANOL

## 2016-12-23 LAB — CK: Total CK: 11003 U/L — ABNORMAL HIGH (ref 49–397)

## 2016-12-23 MED ORDER — HEPARIN SODIUM (PORCINE) 5000 UNIT/ML IJ SOLN
5000.0000 [IU] | Freq: Three times a day (TID) | INTRAMUSCULAR | Status: DC
  Administered 2016-12-24 – 2016-12-26 (×9): 5000 [IU] via SUBCUTANEOUS
  Filled 2016-12-23 (×9): qty 1

## 2016-12-23 MED ORDER — LABETALOL HCL 5 MG/ML IV SOLN: 10.0000 mg | INTRAVENOUS | Status: DC | PRN

## 2016-12-23 MED ORDER — DIAZEPAM 5 MG PO TABS
10.0000 mg | ORAL_TABLET | Freq: Every day | ORAL | Status: DC
  Administered 2016-12-24: 10 mg via ORAL
  Filled 2016-12-23: qty 2

## 2016-12-23 MED ORDER — QUETIAPINE FUMARATE 25 MG PO TABS
100.0000 mg | ORAL_TABLET | Freq: Every day | ORAL | Status: DC
  Administered 2016-12-24 – 2016-12-25 (×3): 100 mg via ORAL
  Filled 2016-12-23 (×3): qty 4

## 2016-12-23 MED ORDER — GABAPENTIN 300 MG PO CAPS
300.0000 mg | ORAL_CAPSULE | Freq: Three times a day (TID) | ORAL | Status: DC
  Administered 2016-12-24 – 2016-12-26 (×8): 300 mg via ORAL
  Filled 2016-12-23 (×8): qty 1

## 2016-12-23 MED ORDER — SODIUM CHLORIDE 0.9 % IV SOLN
INTRAVENOUS | Status: AC
  Administered 2016-12-23: 20:00:00 via INTRAVENOUS

## 2016-12-23 MED ORDER — PRAZOSIN HCL 1 MG PO CAPS
5.0000 mg | ORAL_CAPSULE | Freq: Every day | ORAL | Status: DC
  Administered 2016-12-24 – 2016-12-25 (×3): 5 mg via ORAL
  Filled 2016-12-23 (×4): qty 1

## 2016-12-23 MED ORDER — ATORVASTATIN CALCIUM 40 MG PO TABS
40.0000 mg | ORAL_TABLET | Freq: Every day | ORAL | Status: DC
  Administered 2016-12-24 – 2016-12-25 (×2): 40 mg via ORAL
  Filled 2016-12-23 (×3): qty 1

## 2016-12-23 MED ORDER — STROKE: EARLY STAGES OF RECOVERY BOOK
Freq: Once | Status: AC
  Administered 2016-12-23: 1
  Filled 2016-12-23: qty 1

## 2016-12-23 MED ORDER — ESCITALOPRAM OXALATE 20 MG PO TABS
20.0000 mg | ORAL_TABLET | Freq: Every day | ORAL | Status: DC
  Administered 2016-12-24 – 2016-12-26 (×3): 20 mg via ORAL
  Filled 2016-12-23 (×3): qty 1

## 2016-12-23 MED ORDER — HYDROCODONE-ACETAMINOPHEN 5-325 MG PO TABS
1.0000 | ORAL_TABLET | ORAL | Status: DC | PRN
Start: 2016-12-23 — End: 2016-12-26
  Administered 2016-12-25 (×2): 2 via ORAL
  Filled 2016-12-23 (×2): qty 2

## 2016-12-23 MED ORDER — ASPIRIN EC 81 MG PO TBEC
81.0000 mg | DELAYED_RELEASE_TABLET | Freq: Every day | ORAL | Status: DC
  Administered 2016-12-24 – 2016-12-25 (×2): 81 mg via ORAL
  Filled 2016-12-23 (×2): qty 1

## 2016-12-23 MED ORDER — ASPIRIN 325 MG PO TABS: 325.0000 mg | ORAL_TABLET | Freq: Once | ORAL | Status: DC

## 2016-12-23 MED ORDER — FAMOTIDINE 20 MG PO TABS
10.0000 mg | ORAL_TABLET | Freq: Two times a day (BID) | ORAL | Status: DC
  Administered 2016-12-24 – 2016-12-26 (×5): 10 mg via ORAL
  Filled 2016-12-23 (×5): qty 1

## 2016-12-23 MED ORDER — SENNOSIDES-DOCUSATE SODIUM 8.6-50 MG PO TABS: 1.0000 | ORAL_TABLET | Freq: Every evening | ORAL | Status: DC | PRN

## 2016-12-23 MED ORDER — HYDROMORPHONE HCL 1 MG/ML IJ SOLN
0.5000 mg | Freq: Once | INTRAMUSCULAR | Status: AC
  Administered 2016-12-23: 0.5 mg via INTRAVENOUS
  Filled 2016-12-23: qty 1

## 2016-12-23 NOTE — ED Notes (Signed)
Patient transported to X-ray 

## 2016-12-23 NOTE — ED Notes (Signed)
Pt back from CT

## 2016-12-23 NOTE — ED Provider Notes (Signed)
[facility] DEPT Provider Note   CSN: [id number] Arrival date & time: 12/23/16  1343     History   Chief Complaint Chief Complaint  Patient presents with  . Fall    HPI Perry Cervantes is a 66 y.o. male.  Patient states that he fell last night around 8:00 and had to call EMS to put him back in the house. He was unable to get up. He spent the day in the chair and then could not walk. He returned to the emergency department for further evaluation patient was seen yesterday for fall and contusion to right elbow and left knee He was able to move those extremities normally on that visit.   The history is provided by the patient.  Fall  This is a new problem. The current episode started 12 to 24 hours ago. The problem occurs rarely. The problem has been resolved. Pertinent negatives include no chest pain, no abdominal pain and no headaches. Nothing aggravates the symptoms. Nothing relieves the symptoms. He has tried nothing for the symptoms. The treatment provided no relief.    Past Medical History:  Diagnosis Date  . Anxiety   . Chronic back pain    spondylolisthesis,spondylosis,scoliosis,and radiculopathy  . Chronic pain    secondary to back surgery  . Fibromyalgia    takes Lyrica bid  . Gastroesophageal reflux disease    takes Protonix daily  . History of colon polyps   . History of kidney stones   . Hypertension    takes Amlodipine,Ziac, and Lisinopril daily  . Mild depression ([place])   . PTSD (post-traumatic stress disorder)   . Spondylolysis of lumbar region     Patient Active Problem List   Diagnosis Date Noted  . Lumbar scoliosis 05/14/2013  . Heme positive stool 03/24/2012  . GERD (gastroesophageal reflux disease) 03/24/2012    Past Surgical History:  Procedure Laterality Date  . BACK SURGERY     x 2  . COLONOSCOPY  03/13/2005   RMR: Normal rectum. Diminutive polyp at hepatic flexure cold biopsied/removed (inflamed, benign). Otherwise normal colon,  normal terminal ileum  . COLONOSCOPY N/A 07/28/2012   RMR: Colonic diverticulosis. diminutive colonic polyps (hyerplastic)-removed as described above. Colonic erosion ulceration ad described above. Status post biopsy. I suspect NSAID insult to his colon rather than inflammatory bowel disease . These finding could easily explain Hemocult-positve stool. EGD not needed at this time. Next TCS 07/2022.  . ESOPHAGOGASTRODUODENOSCOPY  03/13/2005   RMR: Small hiatal hernia  . NECK SURGERY  2008   fusion  . right knee arthroscopy     x 2       Home Medications    Prior to Admission medications   Medication Sig Start Date End Date Taking? Authorizing Provider  amLODipine (NORVASC) 10 MG tablet Take 10 mg by mouth daily.  03/06/12  Yes [provider]  aspirin 81 MG tablet Take 81 mg by mouth daily.   Yes [provider]  bisoprolol-hydrochlorothiazide (ZIAC) 10-6.25 MG per tablet Take 1 tablet by mouth daily.  03/06/12  Yes [provider]  diazepam (VALIUM) 10 MG tablet Take one twice a day and two at bedtime Patient taking differently: Take 10-20 mg by mouth 3 (three) times daily. Take one twice a day and two at bedtime 09/20/16  Yes Cloria Spring, MD  escitalopram (LEXAPRO) 20 MG tablet Take 1 tablet (20 mg total) by mouth 2 (two) times daily. 11/02/16 11/02/17 Yes Cloria Spring, MD  gabapentin (  NEURONTIN) 300 MG capsule Take 300 mg by mouth 3 (three) times daily.  12/08/14  Yes [provider]  ibuprofen (ADVIL,MOTRIN) 800 MG tablet Take 1 tablet (800 mg total) by mouth every 8 (eight) hours as needed for moderate pain. 12/22/16  Yes Milton Ferguson, MD  lisinopril (PRINIVIL,ZESTRIL) 5 MG tablet Take 5 mg by mouth daily.  03/06/12  Yes [provider]  loratadine (CLARITIN) 10 MG tablet Take 10 mg by mouth daily as needed for allergies.   Yes [provider]  pantoprazole (PROTONIX) 40 MG tablet TAKE (1) TABLET BY MOUTH ONCE DAILY. 08/08/16  Yes  Carlis Stable, NP  prazosin (MINIPRESS) 5 MG capsule Take 1 capsule (5 mg total) by mouth at bedtime. 11/02/16  Yes Cloria Spring, MD  QUEtiapine (SEROQUEL) 100 MG tablet Take 100 mg by mouth at bedtime.  11/21/16  Yes [provider]  ranitidine (ZANTAC) 150 MG capsule Take 150 mg by mouth 2 (two) times daily.   Yes [provider]    Family History Family History  Problem Relation Age of Onset  . Heart attack Mother   . Other Father        deceased age 59 of Staph sepsis  . Colon cancer Neg Hx     Social History Social History  Substance Use Topics  . Smoking status: Current Every Day Smoker    Packs/day: 1.00    Years: 25.00    Types: Cigarettes  . Smokeless tobacco: Never Used  . Alcohol use Yes     Comment: occasional      Allergies   Patient has no known allergies.   Review of Systems Review of Systems  Constitutional: Negative for appetite change and fatigue.  HENT: Negative for congestion, ear discharge and sinus pressure.   Eyes: Negative for discharge.  Respiratory: Negative for cough.   Cardiovascular: Negative for chest pain.  Gastrointestinal: Negative for abdominal pain and diarrhea.  Genitourinary: Negative for frequency and hematuria.  Musculoskeletal: Negative for back pain.       Weaknesses right arm and right leg  Skin: Negative for rash.  Neurological: Negative for seizures and headaches.  Psychiatric/Behavioral: Negative for hallucinations.     Physical Exam Updated Vital Signs BP 114/80 (BP Location: Left Arm)   Pulse 79   Temp 98 F (36.7 C)   Resp 20   Ht 5' 9.5" (1.765 m)   Wt 79.4 kg (175 lb)   SpO2 96%   BMI 25.47 kg/m   Physical Exam  Constitutional: He is oriented to person, place, and time. He appears well-developed.  HENT:  Head: Normocephalic.  Eyes: Conjunctivae and EOM are normal. No scleral icterus.  Neck: Neck supple. No thyromegaly present.  Cardiovascular: Normal rate and regular rhythm.  Exam  reveals no gallop and no friction rub.   No murmur heard. Pulmonary/Chest: No stridor. He has no wheezes. He has no rales. He exhibits no tenderness.  Abdominal: He exhibits no distension. There is no tenderness. There is no rebound.  Musculoskeletal: Normal range of motion. He exhibits no edema.  Lymphadenopathy:    He has no cervical adenopathy.  Neurological: He is oriented to person, place, and time. He exhibits normal muscle tone.  Patient unable to move her right arm at all. Right leg has significant weakness he is able to lift it off the bed but cannot hold it up high.  Skin: No rash noted. No erythema.  Psychiatric: He has a normal mood and affect.  His behavior is normal.     ED Treatments / Results  Labs (all labs ordered are listed, but only abnormal results are displayed) Labs Reviewed  COMPREHENSIVE METABOLIC PANEL - Abnormal; Notable for the following:       Result Value   CO2 19 (*)    BUN 39 (*)    Creatinine, Ser 2.16 (*)    Calcium 8.8 (*)    AST 198 (*)    Total Bilirubin 1.5 (*)    GFR calc non Af Amer 31 (*)    GFR calc Af Amer 35 (*)    All other components within normal limits  CBC WITH DIFFERENTIAL/PLATELET - Abnormal; Notable for the following:    WBC 16.2 (*)    MCH 34.7 (*)    Neutro Abs 11.9 (*)    All other components within normal limits  CBC WITH DIFFERENTIAL/PLATELET    EKG  EKG Interpretation None       Radiology Dg Elbow Complete Right (3+view)  Result Date: 12/22/2016 CLINICAL DATA:  Right elbow abrasion, fall EXAM: RIGHT ELBOW - COMPLETE 3+ VIEW COMPARISON:  None. FINDINGS: There is no evidence of fracture, dislocation, or joint effusion. There is no evidence of arthropathy or other focal bone abnormality. Soft tissues are unremarkable. IMPRESSION: Negative. Electronically Signed   By: Donavan Foil M.D.   On: 12/22/2016 18:10   Ct Head Wo Contrast  Result Date: 12/23/2016 CLINICAL DATA:  66 year old male with fall yesterday with  headache. Ataxia. History of cervical fusion. EXAM: CT HEAD WITHOUT CONTRAST CT CERVICAL SPINE WITHOUT CONTRAST TECHNIQUE: Multidetector CT imaging of the head and cervical spine was performed following the standard protocol without intravenous contrast. Multiplanar CT image reconstructions of the cervical spine were also generated. COMPARISON:  04/26/2006 cervical spine radiographs and 04/17/2006 cervical spine MR. FINDINGS: CT HEAD FINDINGS Brain: No evidence of acute infarction, hemorrhage, hydrocephalus, extra-axial collection or mass lesion/mass effect. Mild atrophy and chronic small-vessel white matter ischemic changes are noted. Vascular: Intracranial atherosclerotic calcifications noted. Skull: Normal. Negative for fracture or focal lesion. Sinuses/Orbits: No acute finding. Other: None CT CERVICAL SPINE FINDINGS Alignment: Normal. Skull base and vertebrae: No acute fracture. No primary bone lesion or focal pathologic process. Soft tissues and spinal canal: No prevertebral fluid or swelling. No visible canal hematoma. Disc levels: Anterior fusion from C5-C7 noted. Mild to moderate degenerative disc disease/ spondylosis at C3-4 and mild multilevel facet arthropathy identified contributing to mild central spinal and bony foraminal narrowing at several levels. A moderate central disc protrusion at C4-5 noted. Upper chest: No acute abnormality Other: None IMPRESSION: 1. No evidence of acute intracranial abnormality. 2. No static evidence of acute injury to the cervical spine. 3. Moderate central disc protrusion at C4-5. 4. Mild cerebral atrophy and chronic small-vessel white matter ischemic changes. 5. C5-C7 anterior fusion.  Multilevel degenerative changes. Electronically Signed   By: Margarette Canada M.D.   On: 12/23/2016 16:01   Ct Cervical Spine Wo Contrast  Result Date: 12/23/2016 CLINICAL DATA:  66 year old male with fall yesterday with headache. Ataxia. History of cervical fusion. EXAM: CT HEAD WITHOUT  CONTRAST CT CERVICAL SPINE WITHOUT CONTRAST TECHNIQUE: Multidetector CT imaging of the head and cervical spine was performed following the standard protocol without intravenous contrast. Multiplanar CT image reconstructions of the cervical spine were also generated. COMPARISON:  04/26/2006 cervical spine radiographs and 04/17/2006 cervical spine MR. FINDINGS: CT HEAD FINDINGS Brain: No evidence of acute infarction, hemorrhage, hydrocephalus, extra-axial collection or mass lesion/mass effect. Mild  atrophy and chronic small-vessel white matter ischemic changes are noted. Vascular: Intracranial atherosclerotic calcifications noted. Skull: Normal. Negative for fracture or focal lesion. Sinuses/Orbits: No acute finding. Other: None CT CERVICAL SPINE FINDINGS Alignment: Normal. Skull base and vertebrae: No acute fracture. No primary bone lesion or focal pathologic process. Soft tissues and spinal canal: No prevertebral fluid or swelling. No visible canal hematoma. Disc levels: Anterior fusion from C5-C7 noted. Mild to moderate degenerative disc disease/ spondylosis at C3-4 and mild multilevel facet arthropathy identified contributing to mild central spinal and bony foraminal narrowing at several levels. A moderate central disc protrusion at C4-5 noted. Upper chest: No acute abnormality Other: None IMPRESSION: 1. No evidence of acute intracranial abnormality. 2. No static evidence of acute injury to the cervical spine. 3. Moderate central disc protrusion at C4-5. 4. Mild cerebral atrophy and chronic small-vessel white matter ischemic changes. 5. C5-C7 anterior fusion.  Multilevel degenerative changes. Electronically Signed   By: Margarette Canada M.D.   On: 12/23/2016 16:01   Dg Knee Complete 4 Views Left  Result Date: 12/22/2016 CLINICAL DATA:  Fall with red and warm knee EXAM: LEFT KNEE - COMPLETE 4+ VIEW COMPARISON:  None. FINDINGS: No fracture or malalignment. No large effusion. Mild patellofemoral degenerative changes.  Mild changes of the medial compartment. IMPRESSION: Mild degenerative changes.  No acute osseous abnormality. Electronically Signed   By: Donavan Foil M.D.   On: 12/22/2016 18:42    Procedures Procedures (including critical care time)  Medications Ordered in ED Medications  HYDROmorphone (DILAUDID) injection 0.5 mg (0.5 mg Intravenous Given 12/23/16 1447)     Initial Impression / Assessment and Plan / ED Course  I have reviewed the triage vital signs and the nursing notes.  Pertinent labs & imaging results that were available during my care of the patient were reviewed by me and considered in my medical decision making (see chart for details). Labs unremarkable  CT scan of the head and neck showed some disc protrusion in the neck. No acute stroke. Patient will be admitted by medicine over at [facility] for inability to walk or move his right arm. He will get an MRI of the head and neck at [facility]. MRI is not available at [facility] today and tomorrow     Final Clinical Impressions(s) / ED Diagnoses   Final diagnoses:  None    New Prescriptions New Prescriptions   No medications on file     Milton Ferguson, MD 12/23/16 1742

## 2016-12-23 NOTE — ED Triage Notes (Signed)
C/o frequent falls.  Seen here yesterday with same.  Pt unable to use right arm.  States he fell yesterday when getting out of truck after discharge.  States he has to crawl around for mobility .

## 2016-12-23 NOTE — ED Notes (Signed)
Pt remains in xray.

## 2016-12-23 NOTE — ED Notes (Signed)
Pt alert x4, however, unable to focus on one topic when having conversation. Pt has difficulty concentrating and is easily distracted.I have been redirecting conversations due to pt inability to focus.

## 2016-12-23 NOTE — H&P (Addendum)
History and Physical    ALIXANDER RALLIS VQQ:[id number] DOB: 03-04-1951 DOA: 12/23/2016  PCP: Monico Blitz, MD   Patient coming from: Home  Chief Complaint: Falls  HPI: ZALMAN HULL is a 66 y.o. male with medical history significant for chronic back pain, depression, hypertension, PTSD. Patient presented with complaints of multiple falls. Over the past 2 years he has been falling more frequently but reports since Saturday morning- 12/22/16, he is falling much more than usual. He describes trying to stand up and suddenly feeling like his right lower extremity had "gone to sleep", also with weakness in his right upper extremity. Since Saturday morning he has fallen at least 3 times. Patient denies vision change, no slurred, no difficulty swallowing, no facial asymmetry, no bowel or urinary problems.   Patient was in the ED 12/22/16, with complaints of fall hurting his right elbow and left knee. Right elbow x-rays and left knee x-rays were unremarkable, as patient was discharged home to follow-up with PCP., But patient presented again because he fell while trying to get out of the car when he got home. He was on the floor for several hours, as he felt he would be able to get up at some point of he kept trying, his wife could not help him up and subsequently patient presented to the ED again after calling 911.   Patient denied fever or chills, endorses occasional alcohol intake, no shortness of breath or cough, no dysuria or frequency, no diarrhea or vomiting, no abdominal pain, no headaches, has chronic unchanged neck pain and back pain.  ED Course: Stable vitals, blood pressure 114/71. Blood work showed WBC elevated at 16.2 hemoglobin stable at 15.6., Creatinine was elevated at 2.16, BUN elevated at 39 , bicarbonate mildly low at 19. Head CT- negative for acute intracranial abnormality, CT cervical spine without contrast- C4-C5 moderate central disc protrusion, C5 C7 anterior fusion. No acute  cervical spine injury.   Review of Systems: As per HPI otherwise 10 point review of systems negative.   Past Medical History:  Diagnosis Date  . Anxiety   . Chronic back pain    spondylolisthesis,spondylosis,scoliosis,and radiculopathy  . Chronic pain    secondary to back surgery  . Fibromyalgia    takes Lyrica bid  . Gastroesophageal reflux disease    takes Protonix daily  . History of colon polyps   . History of kidney stones   . Hypertension    takes Amlodipine,Ziac, and Lisinopril daily  . Mild depression ([place])   . PTSD (post-traumatic stress disorder)   . Spondylolysis of lumbar region     Past Surgical History:  Procedure Laterality Date  . BACK SURGERY     x 2  . COLONOSCOPY  03/13/2005   RMR: Normal rectum. Diminutive polyp at hepatic flexure cold biopsied/removed (inflamed, benign). Otherwise normal colon, normal terminal ileum  . COLONOSCOPY N/A 07/28/2012   RMR: Colonic diverticulosis. diminutive colonic polyps (hyerplastic)-removed as described above. Colonic erosion ulceration ad described above. Status post biopsy. I suspect NSAID insult to his colon rather than inflammatory bowel disease . These finding could easily explain Hemocult-positve stool. EGD not needed at this time. Next TCS 07/2022.  . ESOPHAGOGASTRODUODENOSCOPY  03/13/2005   RMR: Small hiatal hernia  . NECK SURGERY  2008   fusion  . right knee arthroscopy     x 2    reports that he has been smoking Cigarettes.  He has a 25.00 pack-year smoking history. He has never used  smokeless tobacco. He reports that he drinks alcohol. He reports that he uses drugs, including Marijuana.  No Known Allergies  Family History  Problem Relation Age of Onset  . Heart attack Mother   . Other Father        deceased age 47 of Staph sepsis  . Colon cancer Neg Hx     Prior to Admission medications   Medication Sig Start Date End Date Taking? Authorizing Provider  amLODipine (NORVASC) 10 MG tablet Take 10 mg by  mouth daily.  03/06/12  Yes [provider]  aspirin 81 MG tablet Take 81 mg by mouth daily.   Yes [provider]  bisoprolol-hydrochlorothiazide (ZIAC) 10-6.25 MG per tablet Take 1 tablet by mouth daily.  03/06/12  Yes [provider]  diazepam (VALIUM) 10 MG tablet Take one twice a day and two at bedtime Patient taking differently: Take 10-20 mg by mouth 3 (three) times daily. Take one twice a day and two at bedtime 09/20/16  Yes Cloria Spring, MD  escitalopram (LEXAPRO) 20 MG tablet Take 1 tablet (20 mg total) by mouth 2 (two) times daily. 11/02/16 11/02/17 Yes Cloria Spring, MD  gabapentin (NEURONTIN) 300 MG capsule Take 300 mg by mouth 3 (three) times daily.  12/08/14  Yes [provider]  ibuprofen (ADVIL,MOTRIN) 800 MG tablet Take 1 tablet (800 mg total) by mouth every 8 (eight) hours as needed for moderate pain. 12/22/16  Yes Milton Ferguson, MD  lisinopril (PRINIVIL,ZESTRIL) 5 MG tablet Take 5 mg by mouth daily.  03/06/12  Yes [provider]  loratadine (CLARITIN) 10 MG tablet Take 10 mg by mouth daily as needed for allergies.   Yes [provider]  pantoprazole (PROTONIX) 40 MG tablet TAKE (1) TABLET BY MOUTH ONCE DAILY. 08/08/16  Yes Carlis Stable, NP  prazosin (MINIPRESS) 5 MG capsule Take 1 capsule (5 mg total) by mouth at bedtime. 11/02/16  Yes Cloria Spring, MD  QUEtiapine (SEROQUEL) 100 MG tablet Take 100 mg by mouth at bedtime.  11/21/16  Yes [provider]  ranitidine (ZANTAC) 150 MG capsule Take 150 mg by mouth 2 (two) times daily.   Yes [provider]    Physical Exam: Vitals:   12/23/16 1500 12/23/16 1716 12/23/16 1717 12/23/16 1900  BP: 120/88 114/80  116/64  Pulse: 84 79  78  Resp:  20  17  Temp:  98 F (36.7 C) 98 F (36.7 C)   TempSrc:  Oral    SpO2: 96% 96%  94%  Weight:      Height:        Constitutional: NAD, calm, comfortable Vitals:   12/23/16 1500 12/23/16 1716 12/23/16 1717 12/23/16  1900  BP: 120/88 114/80  116/64  Pulse: 84 79  78  Resp:  20  17  Temp:  98 F (36.7 C) 98 F (36.7 C)   TempSrc:  Oral    SpO2: 96% 96%  94%  Weight:      Height:       Eyes: PERRL, lids and conjunctivae normal ENMT: Mucous membranes are dry. Posterior pharynx clear of any exudate or lesions.Normal dentition.  Neck: normal, supple, no masses, no thyromegaly Respiratory: clear to auscultation bilaterally, no wheezing, no crackles. Normal respiratory effort. No accessory muscle use.  Cardiovascular: Regular rate and rhythm, no murmurs / rubs / gallops. No extremity edema. 2+ pedal pulses. Tenderness to palpation with slight redness/bruising bilateral lower ribs anteriorly.  Abdomen: no tenderness, no  masses palpated. No hepatosplenomegaly. Bowel sounds positive.  Musculoskeletal: no clubbing / cyanosis. No joint deformity upper and lower extremities. Good ROM, no contractures.  Skin: Multiple bruises extremities from falls, in various healing stages Neurologic: CN 2-12 grossly intact. Sensation intact, strength 0/5- RUE, 4/5 - RLE, 5/5- LUE, LLE. Psychiatric: Normal judgment and insight. Alert and oriented x 3. Normal mood.   Labs on Admission: I have personally reviewed following labs and imaging studies  CBC:  Recent Labs Lab 12/23/16 1530  WBC 16.2*  NEUTROABS 11.9*  HGB 15.6  HCT 44.3  MCV 98.4  PLT 027   Basic Metabolic Panel:  Recent Labs Lab 12/23/16 1418  NA 136  K 4.7  CL 103  CO2 19*  GLUCOSE 93  BUN 39*  CREATININE 2.16*  CALCIUM 8.8*   Liver Function Tests:  Recent Labs Lab 12/23/16 1418  AST 198*  ALT 58  ALKPHOS 70  BILITOT 1.5*  PROT 6.5  ALBUMIN 4.1   Urine analysis:    Component Value Date/Time   COLORURINE YELLOW 05/26/2009 1446   APPEARANCEUR CLEAR 05/26/2009 1446   LABSPEC 1.025 05/26/2009 1446   PHURINE 5.0 05/26/2009 1446   GLUCOSEU NEGATIVE 05/26/2009 1446   HGBUR LARGE (A) 05/26/2009 [place] 05/26/2009  1446   KETONESUR NEGATIVE 05/26/2009 1446   PROTEINUR NEGATIVE 05/26/2009 1446   UROBILINOGEN 0.2 05/26/2009 1446   NITRITE NEGATIVE 05/26/2009 1446   LEUKOCYTESUR TRACE (A) 05/26/2009 1446   Radiological Exams on Admission: Dg Elbow Complete Right (3+view)  Result Date: 12/22/2016 CLINICAL DATA:  Right elbow abrasion, fall EXAM: RIGHT ELBOW - COMPLETE 3+ VIEW COMPARISON:  None. FINDINGS: There is no evidence of fracture, dislocation, or joint effusion. There is no evidence of arthropathy or other focal bone abnormality. Soft tissues are unremarkable. IMPRESSION: Negative. Electronically Signed   By: Donavan Foil M.D.   On: 12/22/2016 18:10   Ct Head Wo Contrast  Result Date: 12/23/2016 CLINICAL DATA:  66 year old male with fall yesterday with headache. Ataxia. History of cervical fusion. EXAM: CT HEAD WITHOUT CONTRAST CT CERVICAL SPINE WITHOUT CONTRAST TECHNIQUE: Multidetector CT imaging of the head and cervical spine was performed following the standard protocol without intravenous contrast. Multiplanar CT image reconstructions of the cervical spine were also generated. COMPARISON:  04/26/2006 cervical spine radiographs and 04/17/2006 cervical spine MR. FINDINGS: CT HEAD FINDINGS Brain: No evidence of acute infarction, hemorrhage, hydrocephalus, extra-axial collection or mass lesion/mass effect. Mild atrophy and chronic small-vessel white matter ischemic changes are noted. Vascular: Intracranial atherosclerotic calcifications noted. Skull: Normal. Negative for fracture or focal lesion. Sinuses/Orbits: No acute finding. Other: None CT CERVICAL SPINE FINDINGS Alignment: Normal. Skull base and vertebrae: No acute fracture. No primary bone lesion or focal pathologic process. Soft tissues and spinal canal: No prevertebral fluid or swelling. No visible canal hematoma. Disc levels: Anterior fusion from C5-C7 noted. Mild to moderate degenerative disc disease/ spondylosis at C3-4 and mild multilevel facet  arthropathy identified contributing to mild central spinal and bony foraminal narrowing at several levels. A moderate central disc protrusion at C4-5 noted. Upper chest: No acute abnormality Other: None IMPRESSION: 1. No evidence of acute intracranial abnormality. 2. No static evidence of acute injury to the cervical spine. 3. Moderate central disc protrusion at C4-5. 4. Mild cerebral atrophy and chronic small-vessel white matter ischemic changes. 5. C5-C7 anterior fusion.  Multilevel degenerative changes. Electronically Signed   By: Margarette Canada M.D.   On: 12/23/2016 16:01   Ct Cervical Spine Wo  Contrast  Result Date: 12/23/2016 CLINICAL DATA:  66 year old male with fall yesterday with headache. Ataxia. History of cervical fusion. EXAM: CT HEAD WITHOUT CONTRAST CT CERVICAL SPINE WITHOUT CONTRAST TECHNIQUE: Multidetector CT imaging of the head and cervical spine was performed following the standard protocol without intravenous contrast. Multiplanar CT image reconstructions of the cervical spine were also generated. COMPARISON:  04/26/2006 cervical spine radiographs and 04/17/2006 cervical spine MR. FINDINGS: CT HEAD FINDINGS Brain: No evidence of acute infarction, hemorrhage, hydrocephalus, extra-axial collection or mass lesion/mass effect. Mild atrophy and chronic small-vessel white matter ischemic changes are noted. Vascular: Intracranial atherosclerotic calcifications noted. Skull: Normal. Negative for fracture or focal lesion. Sinuses/Orbits: No acute finding. Other: None CT CERVICAL SPINE FINDINGS Alignment: Normal. Skull base and vertebrae: No acute fracture. No primary bone lesion or focal pathologic process. Soft tissues and spinal canal: No prevertebral fluid or swelling. No visible canal hematoma. Disc levels: Anterior fusion from C5-C7 noted. Mild to moderate degenerative disc disease/ spondylosis at C3-4 and mild multilevel facet arthropathy identified contributing to mild central spinal and bony  foraminal narrowing at several levels. A moderate central disc protrusion at C4-5 noted. Upper chest: No acute abnormality Other: None IMPRESSION: 1. No evidence of acute intracranial abnormality. 2. No static evidence of acute injury to the cervical spine. 3. Moderate central disc protrusion at C4-5. 4. Mild cerebral atrophy and chronic small-vessel white matter ischemic changes. 5. C5-C7 anterior fusion.  Multilevel degenerative changes. Electronically Signed   By: Margarette Canada M.D.   On: 12/23/2016 16:01   Dg Knee Complete 4 Views Left  Result Date: 12/22/2016 CLINICAL DATA:  Fall with red and warm knee EXAM: LEFT KNEE - COMPLETE 4+ VIEW COMPARISON:  None. FINDINGS: No fracture or malalignment. No large effusion. Mild patellofemoral degenerative changes. Mild changes of the medial compartment. IMPRESSION: Mild degenerative changes.  No acute osseous abnormality. Electronically Signed   By: Donavan Foil M.D.   On: 12/22/2016 18:42    EKG: left anterior fasicular block.   Assessment/Plan Principal Problem:   Right sided weakness Active Problems:   PTSD (post-traumatic stress disorder)   Depression   Chronic back pain   AKI (acute kidney injury) ([place])   Rhabdomyolysis   Right-sided weakness-  2 days' duration . CT head unremarkable. No other associated CVA symptoms - Transfer to [facility] telemetry observation, for further stroke workup- MRI and neurology, as no MRI available over the holiday at [facility]. - Lipid panel, hemoglobin A1c a.m. - Echocardiogram - Carotid Dopplers - Neurology consult a.m. - Aspirin 3251, then cont home 81mg  daily - MRI brain - If MRI brain negative for stroke, consider MRI cervical spine. - Hold home antihypertensive, allow for permissive hypertension, setting of possible CVA - PRN labetalol - PT, OT, ST when able - Passed bedside swallow evaluation in ED  Acute kidney injury- 2/2 rhabdomyolysis, and recent poor by mouth intake. Cr- 2.16, last  creatinine check 05/07/13- 1.1. Likely prerenal with elevated BUN. Patient reports multiple falls and lying on the floor for hours - CK check- add on, elevated at 11, 003 - BMP a.m. - IV normal saline 125 cc/hr X 24 hrs - Trend CK  Falls - With tenderness to palpation bilateral lower rib cage, with bruising. EKG fascicular block- new compared to prior.  - X-ray ribs negative for fracture - Trend troponins - Echo ordered as part of Stroke workup - Hydrocodone acetaminophen 5-325 for pain  Rhabdomyolysis- Ck- 11,003 from falls, prolonged immobility. - CK a.m. -  Hydrate  Leukocytosis- 16.2. No fever, no symptoms to suggest focus of infection. Likely stress reaction. - CBC a.m.  PTSD- patient is on Seroquel 100 mg daily at bedtime, and Valium 20 mg daily at bedtime. - Continue Valium at 10 mg daily at bedtime, considering high dose of Valium 20 mg. - Continue home Seroquel  Depression- stable - continue home Lexapro  Chronic back pain- continue home gabapentin. - hydroco-ace 5-325 for pain  DVT prophylaxis: Lovenox Code Status: Full  Family Communication: None at bedside Disposition Plan: To be determined  Consults called: neurology Am  Admission status: Obs, tele   Bethena Roys MD [facility] Pager [phone]  If 7PM-7AM, please contact night-coverage [email or web address] Password [facility]  12/23/2016, 7:16 PM

## 2016-12-24 ENCOUNTER — Encounter (HOSPITAL_COMMUNITY): Payer: Self-pay | Admitting: Cardiology

## 2016-12-24 ENCOUNTER — Observation Stay (HOSPITAL_BASED_OUTPATIENT_CLINIC_OR_DEPARTMENT_OTHER): Payer: Medicare Other

## 2016-12-24 DIAGNOSIS — R296 Repeated falls: Secondary | ICD-10-CM

## 2016-12-24 DIAGNOSIS — F4310 Post-traumatic stress disorder, unspecified: Secondary | ICD-10-CM | POA: Diagnosis present

## 2016-12-24 DIAGNOSIS — I447 Left bundle-branch block, unspecified: Secondary | ICD-10-CM | POA: Diagnosis present

## 2016-12-24 DIAGNOSIS — F17210 Nicotine dependence, cigarettes, uncomplicated: Secondary | ICD-10-CM | POA: Diagnosis present

## 2016-12-24 DIAGNOSIS — W010XXA Fall on same level from slipping, tripping and stumbling without subsequent striking against object, initial encounter: Secondary | ICD-10-CM | POA: Diagnosis not present

## 2016-12-24 DIAGNOSIS — I639 Cerebral infarction, unspecified: Secondary | ICD-10-CM | POA: Diagnosis present

## 2016-12-24 DIAGNOSIS — G8929 Other chronic pain: Secondary | ICD-10-CM

## 2016-12-24 DIAGNOSIS — N179 Acute kidney failure, unspecified: Secondary | ICD-10-CM

## 2016-12-24 DIAGNOSIS — F329 Major depressive disorder, single episode, unspecified: Secondary | ICD-10-CM | POA: Diagnosis present

## 2016-12-24 DIAGNOSIS — I6330 Cerebral infarction due to thrombosis of unspecified cerebral artery: Secondary | ICD-10-CM | POA: Diagnosis not present

## 2016-12-24 DIAGNOSIS — F419 Anxiety disorder, unspecified: Secondary | ICD-10-CM | POA: Diagnosis present

## 2016-12-24 DIAGNOSIS — E785 Hyperlipidemia, unspecified: Secondary | ICD-10-CM | POA: Diagnosis present

## 2016-12-24 DIAGNOSIS — M797 Fibromyalgia: Secondary | ICD-10-CM | POA: Diagnosis present

## 2016-12-24 DIAGNOSIS — M545 Low back pain: Secondary | ICD-10-CM

## 2016-12-24 DIAGNOSIS — Z7982 Long term (current) use of aspirin: Secondary | ICD-10-CM | POA: Diagnosis not present

## 2016-12-24 DIAGNOSIS — S50311A Abrasion of right elbow, initial encounter: Secondary | ICD-10-CM | POA: Diagnosis present

## 2016-12-24 DIAGNOSIS — Z720 Tobacco use: Secondary | ICD-10-CM | POA: Diagnosis not present

## 2016-12-24 DIAGNOSIS — F4323 Adjustment disorder with mixed anxiety and depressed mood: Secondary | ICD-10-CM | POA: Diagnosis not present

## 2016-12-24 DIAGNOSIS — J189 Pneumonia, unspecified organism: Secondary | ICD-10-CM | POA: Diagnosis present

## 2016-12-24 DIAGNOSIS — Z23 Encounter for immunization: Secondary | ICD-10-CM | POA: Diagnosis present

## 2016-12-24 DIAGNOSIS — I10 Essential (primary) hypertension: Secondary | ICD-10-CM

## 2016-12-24 DIAGNOSIS — R531 Weakness: Secondary | ICD-10-CM | POA: Diagnosis present

## 2016-12-24 DIAGNOSIS — D72829 Elevated white blood cell count, unspecified: Secondary | ICD-10-CM | POA: Diagnosis not present

## 2016-12-24 DIAGNOSIS — G8191 Hemiplegia, unspecified affecting right dominant side: Secondary | ICD-10-CM | POA: Diagnosis present

## 2016-12-24 DIAGNOSIS — Z79899 Other long term (current) drug therapy: Secondary | ICD-10-CM | POA: Diagnosis not present

## 2016-12-24 DIAGNOSIS — Z981 Arthrodesis status: Secondary | ICD-10-CM | POA: Diagnosis not present

## 2016-12-24 DIAGNOSIS — I6930 Unspecified sequelae of cerebral infarction: Secondary | ICD-10-CM | POA: Diagnosis not present

## 2016-12-24 DIAGNOSIS — W19XXXS Unspecified fall, sequela: Secondary | ICD-10-CM | POA: Diagnosis not present

## 2016-12-24 DIAGNOSIS — T796XXA Traumatic ischemia of muscle, initial encounter: Secondary | ICD-10-CM | POA: Diagnosis not present

## 2016-12-24 DIAGNOSIS — M6282 Rhabdomyolysis: Secondary | ICD-10-CM

## 2016-12-24 DIAGNOSIS — K219 Gastro-esophageal reflux disease without esophagitis: Secondary | ICD-10-CM | POA: Diagnosis present

## 2016-12-24 DIAGNOSIS — M549 Dorsalgia, unspecified: Secondary | ICD-10-CM | POA: Diagnosis present

## 2016-12-24 DIAGNOSIS — I69351 Hemiplegia and hemiparesis following cerebral infarction affecting right dominant side: Secondary | ICD-10-CM | POA: Diagnosis not present

## 2016-12-24 DIAGNOSIS — T796XXS Traumatic ischemia of muscle, sequela: Secondary | ICD-10-CM | POA: Diagnosis not present

## 2016-12-24 DIAGNOSIS — I6300 Cerebral infarction due to thrombosis of unspecified precerebral artery: Secondary | ICD-10-CM | POA: Diagnosis not present

## 2016-12-24 DIAGNOSIS — I69331 Monoplegia of upper limb following cerebral infarction affecting right dominant side: Secondary | ICD-10-CM | POA: Diagnosis not present

## 2016-12-24 DIAGNOSIS — F22 Delusional disorders: Secondary | ICD-10-CM | POA: Diagnosis not present

## 2016-12-24 DIAGNOSIS — F4312 Post-traumatic stress disorder, chronic: Secondary | ICD-10-CM | POA: Diagnosis not present

## 2016-12-24 DIAGNOSIS — S80212A Abrasion, left knee, initial encounter: Secondary | ICD-10-CM | POA: Diagnosis present

## 2016-12-24 DIAGNOSIS — M792 Neuralgia and neuritis, unspecified: Secondary | ICD-10-CM | POA: Diagnosis not present

## 2016-12-24 LAB — ECHOCARDIOGRAM COMPLETE
CHL CUP MV DEC (S): 366
CHL CUP STROKE VOLUME: 47 mL
CHL CUP TV REG PEAK VELOCITY: 121 cm/s
E/e' ratio: 7.05
EWDT: 366 ms
FS: 38 % (ref 28–44)
HEIGHTINCHES: 69 in
IVS/LV PW RATIO, ED: 1
LA ID, A-P, ES: 30 mm
LA diam index: 1.52 cm/m2
LA vol A4C: 31.4 ml
LA vol index: 19.2 mL/m2
LA vol: 38 mL
LDCA: 3.46 cm2
LEFT ATRIUM END SYS DIAM: 30 mm
LV E/e' medial: 7.05
LV E/e'average: 7.05
LV TDI E'LATERAL: 9.57
LV TDI E'MEDIAL: 7.07
LV dias vol index: 33 mL/m2
LV dias vol: 65 mL (ref 62–150)
LV sys vol index: 9 mL/m2
LVELAT: 9.57 cm/s
LVOT VTI: 23.6 cm
LVOT peak grad rest: 10 mmHg
LVOTD: 21 mm
LVOTPV: 156 cm/s
LVOTSV: 82 mL
LVSYSVOL: 18 mL — AB
MV pk A vel: 75.8 m/s
MV pk E vel: 67.5 m/s
MVAP: 1.38 cm2
P 1/2 time: 107 ms
PW: 12 mm — AB (ref 0.6–1.1)
RV LATERAL S' VELOCITY: 15.4 cm/s
Simpson's disk: 73
TAPSE: 16.9 mm
TR max vel: 121 cm/s
WEIGHTICAEL: 2800 [oz_av]

## 2016-12-24 LAB — COMPREHENSIVE METABOLIC PANEL
ALBUMIN: 3.1 g/dL — AB (ref 3.5–5.0)
ALT: 38 U/L (ref 17–63)
AST: 79 U/L — AB (ref 15–41)
Alkaline Phosphatase: 53 U/L (ref 38–126)
Anion gap: 10 (ref 5–15)
BUN: 37 mg/dL — ABNORMAL HIGH (ref 6–20)
CHLORIDE: 106 mmol/L (ref 101–111)
CO2: 20 mmol/L — AB (ref 22–32)
CREATININE: 1.68 mg/dL — AB (ref 0.61–1.24)
Calcium: 8.1 mg/dL — ABNORMAL LOW (ref 8.9–10.3)
GFR calc Af Amer: 48 mL/min — ABNORMAL LOW (ref >60)
GFR calc non Af Amer: 41 mL/min — ABNORMAL LOW (ref >60)
GLUCOSE: 149 mg/dL — AB (ref 65–99)
Potassium: 3.9 mmol/L (ref 3.5–5.1)
SODIUM: 136 mmol/L (ref 135–145)
Total Bilirubin: 0.5 mg/dL (ref 0.3–1.2)
Total Protein: 5.1 g/dL — ABNORMAL LOW (ref 6.5–8.1)

## 2016-12-24 LAB — LIPID PANEL
CHOL/HDL RATIO: 5.1 ratio
Cholesterol: 174 mg/dL (ref 0–200)
HDL: 34 mg/dL — AB (ref >40)
LDL CALC: 107 mg/dL — AB (ref 0–99)
Triglycerides: 167 mg/dL — ABNORMAL HIGH (ref <150)
VLDL: 33 mg/dL (ref 0–40)

## 2016-12-24 LAB — CK: CK TOTAL: 4444 U/L — AB (ref 49–397)

## 2016-12-24 LAB — HEMOGLOBIN A1C
Hgb A1c MFr Bld: 4.9 % (ref 4.8–5.6)
MEAN PLASMA GLUCOSE: 93.93 mg/dL

## 2016-12-24 LAB — TROPONIN I: Troponin I: <0.03 ng/mL (ref <0.03)

## 2016-12-24 MED ORDER — DIAZEPAM 5 MG PO TABS
5.0000 mg | ORAL_TABLET | Freq: Every evening | ORAL | Status: DC | PRN
  Administered 2016-12-24 – 2016-12-25 (×2): 5 mg via ORAL
  Filled 2016-12-24 (×2): qty 1

## 2016-12-24 MED ORDER — DIAZEPAM 5 MG PO TABS
10.0000 mg | ORAL_TABLET | Freq: Two times a day (BID) | ORAL | Status: DC | PRN
  Administered 2016-12-24 – 2016-12-26 (×3): 10 mg via ORAL
  Filled 2016-12-24 (×3): qty 2

## 2016-12-24 NOTE — Evaluation (Signed)
Occupational Therapy Evaluation Patient Details Name: Perry Cervantes MRN: [id number] DOB: 1950-11-19 Today's Date: 12/24/2016    History of Present Illness Pt admitted with R side weakness, falls, contusions R elbow and L knee, rhabdo, AKI, CT negative for acute intracranial abnormalities. PMH: PTSD, depression, chronic back pain, fibromyalgia.   Clinical Impression   Pt was independent prior to admission, but admits to many falls. Pt presents with R side weakness, greater in UE vs LE, poor sitting balance and impaired cognition. Pt was able to bear some weight on his R LE to squat pivot to the chair toward the L side with moderate assistance and verbal cues for technique. Pt with urinary incontinence and wet bedding, but unaware. Pt reports his wife cannot physically assist him at home. Pt will need SNF for rehab. Will follow.    Follow Up Recommendations  SNF;Supervision/Assistance - 24 hour    Equipment Recommendations       Recommendations for Other Services       Precautions / Restrictions Precautions Precautions: Fall Restrictions Weight Bearing Restrictions: No      Mobility Bed Mobility Overal bed mobility: Needs Assistance Bed Mobility: Supine to Sit     Supine to sit: Max assist     General bed mobility comments: assist for LEs over EOB, to raise trunk and to square hips up, pt requiring cues for sequencing, increased time  Transfers Overall transfer level: Needs assistance   Transfers: Squat Pivot Transfers     Squat pivot transfers: Mod assist     General transfer comment: R knee blocked, cues for hand placement, transferred to L side    Balance Overall balance assessment: Needs assistance   Sitting balance-Leahy Scale: Poor   Postural control: Posterior lean;Left lateral lean                                 ADL either performed or assessed with clinical judgement   ADL Overall ADL's : Needs assistance/impaired Eating/Feeding:  Set up;Sitting   Grooming: Wash/dry hands;Wash/dry face;Moderate assistance;Sitting   Upper Body Bathing: Maximal assistance;Sitting   Lower Body Bathing: Total assistance;Sitting/lateral leans Lower Body Bathing Details (indicate cue type and reason): socks Upper Body Dressing : Moderate assistance;Sitting   Lower Body Dressing: Total assistance;Sitting/lateral leans   Toilet Transfer: Moderate assistance;Squat-pivot;BSC   Toileting- Clothing Manipulation and Hygiene: Total assistance;Sitting/lateral lean               Vision Patient Visual Report: No change from baseline       Perception     Praxis      Pertinent Vitals/Pain Pain Assessment: 0-10 Pain Score: 10-Worst pain ever Pain Location: back Pain Descriptors / Indicators: Aching Pain Intervention(s): Monitored during session;Repositioned     Hand Dominance Left   Extremity/Trunk Assessment Upper Extremity Assessment Upper Extremity Assessment: RUE deficits/detail RUE Deficits / Details: no voluntary movement, sensation grossly intact RUE Coordination: decreased fine motor;decreased gross motor   Lower Extremity Assessment Lower Extremity Assessment: Defer to PT evaluation   Cervical / Trunk Assessment Cervical / Trunk Assessment: Other exceptions Cervical / Trunk Exceptions: h/o ACDF, back sx   Communication Communication Communication: Expressive difficulties (slightly slurred)   Cognition Arousal/Alertness: Awake/alert Behavior During Therapy: Impulsive (easily agitated, pt reports this is baseline) Overall Cognitive Status: Impaired/Different from baseline Area of Impairment: Safety/judgement;Following commands;Attention;Problem solving  Current Attention Level: Sustained   Following Commands: Follows one step commands with increased time Safety/Judgement: Decreased awareness of safety;Decreased awareness of deficits   Problem Solving: Difficulty sequencing;Requires  verbal cues General Comments: pt with rambling conversation   General Comments       Exercises     Shoulder Instructions      Home Living Family/patient expects to be discharged to:: Private residence Living Arrangements: Spouse/significant other Available Help at Discharge: Family;Available 24 hours/day (wife cannot physically assist) Type of Home: House Home Access: Stairs to enter [employer] of Steps: 3 Entrance Stairs-Rails: None Home Layout: Two level;Able to live on main level with bedroom/bathroom     Bathroom Shower/Tub: Teacher, early years/pre: Standard     Home Equipment: Bedside commode;Walker - 2 wheels;Cane - single point;Hand held shower head      Lives With: Spouse    Prior Functioning/Environment Level of Independence: Independent        Comments: hx of many falls        OT Problem List: Decreased strength;Decreased activity tolerance;Impaired balance (sitting and/or standing);Decreased coordination;Decreased cognition;Decreased safety awareness;Decreased knowledge of use of DME or AE;Impaired UE functional use;Pain      OT Treatment/Interventions: Self-care/ADL training;DME and/or AE instruction;Cognitive remediation/compensation;Patient/family education;Balance training;Neuromuscular education    OT Goals(Current goals can be found in the care plan section) Acute Rehab OT Goals Patient Stated Goal: to return to playing the guitar OT Goal Formulation: With patient Time For Goal Achievement: 01/07/17 Potential to Achieve Goals: Fair ADL Goals Pt Will Perform Grooming: with min assist;sitting Pt Will Perform Upper Body Bathing: with mod assist;sitting Pt Will Perform Upper Body Dressing: with mod assist;sitting Pt Will Transfer to Toilet: with min assist;stand pivot transfer;bedside commode Pt/caregiver will Perform Home Exercise Program: Right Upper extremity;With minimal assist;Increased ROM;Increased strength Additional  ADL Goal #1: Pt will perform bed mobility with min assist in preparation for ADL. Additional ADL Goal #2: Pt will demonstrate fair sitting balance at EOB in preparation for ADL.  OT Frequency: Min 3X/week   Barriers to D/C: Decreased caregiver support          Co-evaluation              AM-PAC PT "6 Clicks" Daily Activity     Outcome Measure Help from another person eating meals?: A Little Help from another person taking care of personal grooming?: A Lot Help from another person toileting, which includes using toliet, bedpan, or urinal?: Total Help from another person bathing (including washing, rinsing, drying)?: A Lot Help from another person to put on and taking off regular upper body clothing?: A Lot Help from another person to put on and taking off regular lower body clothing?: Total 6 Click Score: 11   End of Session Equipment Utilized During Treatment: Gait belt Nurse Communication: Mobility status (pt with urinary incontinence)  Activity Tolerance: Patient tolerated treatment well Patient left: in chair;with call bell/phone within reach;with chair alarm set  OT Visit Diagnosis: Muscle weakness (generalized) (M62.81);Hemiplegia and hemiparesis;Other symptoms and signs involving cognitive function;Repeated falls (R29.6) Hemiplegia - Right/Left: Right Hemiplegia - dominant/non-dominant: Non-Dominant Hemiplegia - caused by: Unspecified                Time: 1412-1500 OT Time Calculation (min): 48 min Charges:  OT General Charges $OT Visit: 1 Visit OT Evaluation $OT Eval Moderate Complexity: 1 Mod OT Treatments $Self Care/Home Management : 23-37 mins G-Codes: OT G-codes **NOT FOR INPATIENT CLASS** Functional Assessment Tool Used: Clinical  judgement Functional Limitation: Self care Self Care Current Status [id number]): At least 80 percent but less than 100 percent impaired, limited or restricted Self Care Goal Status (P6924): At least 40 percent but less than 60 percent  impaired, limited or restricted    Malka So 12/24/2016, 3:27 PM  12/24/2016 Nestor Lewandowsky, OTR/L Pager: [phone]

## 2016-12-24 NOTE — Progress Notes (Signed)
PROGRESS NOTE    [name]  TKZ:[id number] DOB: Feb 04, 1951 DOA: 12/23/2016 PCP: Monico Blitz, MD   Outpatient Specialists:     Brief Narrative:   Perry Cervantes is a 66 y.o. male with medical history significant for chronic back pain, depression, hypertension, PTSD. Patient presented with complaints of multiple falls. Over the past 2 years he has been falling more frequently but reports since Saturday morning- 12/22/16, he is falling much more than usual. He describes trying to stand up and suddenly feeling like his right lower extremity had "gone to sleep", also with weakness in his right upper extremity. Since Saturday morning he has fallen at least 3 times. Patient denies vision change, no slurred, no difficulty swallowing, no facial asymmetry, no bowel or urinary problems.   Patient was in the ED 12/22/16, with complaints of fall hurting his right elbow and left knee. Right elbow x-rays and left knee x-rays were unremarkable, as patient was discharged home to follow-up with PCP., But patient presented again because he fell while trying to get out of the car when he got home. He was on the floor for several hours, as he felt he would be able to get up at some point of he kept trying, his wife could not help him up and subsequently patient presented to the ED again after calling 911.   Patient denied fever or chills, endorses occasional alcohol intake, no shortness of breath or cough, no dysuria or frequency, no diarrhea or vomiting, no abdominal pain, no headaches, has chronic unchanged neck pain and back pain.   Assessment & Plan:   Principal Problem:   Right sided weakness Active Problems:   PTSD (post-traumatic stress disorder)   Depression   Chronic back pain   AKI (acute kidney injury) ([place])   Rhabdomyolysis   New LBBB -troponin pending -echo pending -repeat EKG today -cardiology consult  AKI -IVF  Rhabdomyolysis -IVF, CK trending down  Right sided  weakness -MRI pending -will talk with NeuroSurgery tomm (stern)-- has right foot drop (peroneal nerve/L5?) -multiple falls- PT eval  PTSD -on seroquel-- monitor Qtc    DVT prophylaxis:  Lovenox   Code Status: Full Code   Family Communication:   Disposition Plan:     Consultants:   cards  Procedures:      Subjective: Poor historian- multiple falls-- he does not describe syncope but rather mechanical falls  Objective: Vitals:   12/23/16 2130 12/23/16 2200 12/23/16 2342 12/24/16 0657  BP: 136/80 124/71 (!) 142/78 (!) 92/42  Pulse: 83 80 83 72  Resp: 20 17 20 18   Temp:   98 F (36.7 C) 97.9 F (36.6 C)  TempSrc:    Oral  SpO2: 93% 95% 96% 93%  Weight:      Height:   5\' 9"  (1.753 m)     Intake/Output Summary (Last 24 hours) at 12/24/16 1238 Last data filed at 12/24/16 1226  Gross per 24 hour  Intake              240 ml  Output              300 ml  Net              -60 ml   Filed Weights   12/23/16 1348  Weight: 79.4 kg (175 lb)    Examination:  General exam: NAD, in bed Respiratory system: Clear to auscultation. Respiratory effort normal. Cardiovascular system: S1 & S2 heard, RRR. No JVD, murmurs,  rubs, gallops or clicks. No pedal edema. Gastrointestinal system: Abdomen is nondistended, soft and nontender. No organomegaly or masses felt. Normal bowel sounds heard. Central nervous system: Alert and oriented. No focal neurological deficits. Extremities: impaired dorsiflexon on the right leg Psychiatry: scattered thought process- poor historian    Data Reviewed: I have personally reviewed following labs and imaging studies  CBC:  Recent Labs Lab 12/23/16 1530  WBC 16.2*  NEUTROABS 11.9*  HGB 15.6  HCT 44.3  MCV 98.4  PLT 245   Basic Metabolic Panel:  Recent Labs Lab 12/23/16 1418 12/24/16 0948  NA 136 136  K 4.7 3.9  CL 103 106  CO2 19* 20*  GLUCOSE 93 149*  BUN 39* 37*  CREATININE 2.16* 1.68*  CALCIUM 8.8* 8.1*    GFR: Estimated Creatinine Clearance: 44.4 mL/min (A) (by C-G formula based on SCr of 1.68 mg/dL (H)). Liver Function Tests:  Recent Labs Lab 12/23/16 1418 12/24/16 0948  AST 198* 79*  ALT 58 38  ALKPHOS 70 53  BILITOT 1.5* 0.5  PROT 6.5 5.1*  ALBUMIN 4.1 3.1*   No results for input(s): LIPASE, AMYLASE in the last 168 hours. No results for input(s): AMMONIA in the last 168 hours. Coagulation Profile: No results for input(s): INR, PROTIME in the last 168 hours. Cardiac Enzymes:  Recent Labs Lab 12/23/16 1959 12/24/16 0948  CKTOTAL 11,003* 4,444*   BNP (last 3 results) No results for input(s): PROBNP in the last 8760 hours. HbA1C:  Recent Labs  12/24/16 0010  HGBA1C 4.9   CBG: No results for input(s): GLUCAP in the last 168 hours. Lipid Profile:  Recent Labs  12/24/16 0010  CHOL 174  HDL 34*  LDLCALC 107*  TRIG 167*  CHOLHDL 5.1   Thyroid Function Tests: No results for input(s): TSH, T4TOTAL, FREET4, T3FREE, THYROIDAB in the last 72 hours. Anemia Panel: No results for input(s): VITAMINB12, FOLATE, FERRITIN, TIBC, IRON, RETICCTPCT in the last 72 hours. Urine analysis:    Component Value Date/Time   COLORURINE YELLOW 05/26/2009 [place] 05/26/2009 1446   LABSPEC 1.025 05/26/2009 1446   PHURINE 5.0 05/26/2009 1446   GLUCOSEU NEGATIVE 05/26/2009 1446   HGBUR LARGE (A) 05/26/2009 1446   BILIRUBINUR NEGATIVE 05/26/2009 1446   KETONESUR NEGATIVE 05/26/2009 1446   PROTEINUR NEGATIVE 05/26/2009 1446   UROBILINOGEN 0.2 05/26/2009 1446   NITRITE NEGATIVE 05/26/2009 1446   LEUKOCYTESUR TRACE (A) 05/26/2009 1446     )No results found for this or any previous visit (from the past 240 hour(s)).    Anti-infectives    None       Radiology Studies: Dg Ribs Bilateral W/chest  Result Date: 12/23/2016 CLINICAL DATA:  Fall last night.  Pain and weakness EXAM: BILATERAL RIBS AND CHEST - 4+ VIEW COMPARISON:  None. FINDINGS: Normal cardiac  silhouette. Cervical fusion. No pneumothorax or pulmonary contusion. Dedicated views of the ribs demonstrate no displaced fracture. IMPRESSION: No rib fracture or thoracic trauma. Electronically Signed   By: Suzy Bouchard M.D.   On: 12/23/2016 20:23   Dg Elbow Complete Right (3+view)  Result Date: 12/22/2016 CLINICAL DATA:  Right elbow abrasion, fall EXAM: RIGHT ELBOW - COMPLETE 3+ VIEW COMPARISON:  None. FINDINGS: There is no evidence of fracture, dislocation, or joint effusion. There is no evidence of arthropathy or other focal bone abnormality. Soft tissues are unremarkable. IMPRESSION: Negative. Electronically Signed   By: Donavan Foil M.D.   On: 12/22/2016 18:10   Ct Head Wo Contrast  Result  Date: 12/23/2016 CLINICAL DATA:  66 year old male with fall yesterday with headache. Ataxia. History of cervical fusion. EXAM: CT HEAD WITHOUT CONTRAST CT CERVICAL SPINE WITHOUT CONTRAST TECHNIQUE: Multidetector CT imaging of the head and cervical spine was performed following the standard protocol without intravenous contrast. Multiplanar CT image reconstructions of the cervical spine were also generated. COMPARISON:  04/26/2006 cervical spine radiographs and 04/17/2006 cervical spine MR. FINDINGS: CT HEAD FINDINGS Brain: No evidence of acute infarction, hemorrhage, hydrocephalus, extra-axial collection or mass lesion/mass effect. Mild atrophy and chronic small-vessel white matter ischemic changes are noted. Vascular: Intracranial atherosclerotic calcifications noted. Skull: Normal. Negative for fracture or focal lesion. Sinuses/Orbits: No acute finding. Other: None CT CERVICAL SPINE FINDINGS Alignment: Normal. Skull base and vertebrae: No acute fracture. No primary bone lesion or focal pathologic process. Soft tissues and spinal canal: No prevertebral fluid or swelling. No visible canal hematoma. Disc levels: Anterior fusion from C5-C7 noted. Mild to moderate degenerative disc disease/ spondylosis at C3-4 and  mild multilevel facet arthropathy identified contributing to mild central spinal and bony foraminal narrowing at several levels. A moderate central disc protrusion at C4-5 noted. Upper chest: No acute abnormality Other: None IMPRESSION: 1. No evidence of acute intracranial abnormality. 2. No static evidence of acute injury to the cervical spine. 3. Moderate central disc protrusion at C4-5. 4. Mild cerebral atrophy and chronic small-vessel white matter ischemic changes. 5. C5-C7 anterior fusion.  Multilevel degenerative changes. Electronically Signed   By: Margarette Canada M.D.   On: 12/23/2016 16:01   Ct Cervical Spine Wo Contrast  Result Date: 12/23/2016 CLINICAL DATA:  66 year old male with fall yesterday with headache. Ataxia. History of cervical fusion. EXAM: CT HEAD WITHOUT CONTRAST CT CERVICAL SPINE WITHOUT CONTRAST TECHNIQUE: Multidetector CT imaging of the head and cervical spine was performed following the standard protocol without intravenous contrast. Multiplanar CT image reconstructions of the cervical spine were also generated. COMPARISON:  04/26/2006 cervical spine radiographs and 04/17/2006 cervical spine MR. FINDINGS: CT HEAD FINDINGS Brain: No evidence of acute infarction, hemorrhage, hydrocephalus, extra-axial collection or mass lesion/mass effect. Mild atrophy and chronic small-vessel white matter ischemic changes are noted. Vascular: Intracranial atherosclerotic calcifications noted. Skull: Normal. Negative for fracture or focal lesion. Sinuses/Orbits: No acute finding. Other: None CT CERVICAL SPINE FINDINGS Alignment: Normal. Skull base and vertebrae: No acute fracture. No primary bone lesion or focal pathologic process. Soft tissues and spinal canal: No prevertebral fluid or swelling. No visible canal hematoma. Disc levels: Anterior fusion from C5-C7 noted. Mild to moderate degenerative disc disease/ spondylosis at C3-4 and mild multilevel facet arthropathy identified contributing to mild central  spinal and bony foraminal narrowing at several levels. A moderate central disc protrusion at C4-5 noted. Upper chest: No acute abnormality Other: None IMPRESSION: 1. No evidence of acute intracranial abnormality. 2. No static evidence of acute injury to the cervical spine. 3. Moderate central disc protrusion at C4-5. 4. Mild cerebral atrophy and chronic small-vessel white matter ischemic changes. 5. C5-C7 anterior fusion.  Multilevel degenerative changes. Electronically Signed   By: Margarette Canada M.D.   On: 12/23/2016 16:01   Dg Knee Complete 4 Views Left  Result Date: 12/22/2016 CLINICAL DATA:  Fall with red and warm knee EXAM: LEFT KNEE - COMPLETE 4+ VIEW COMPARISON:  None. FINDINGS: No fracture or malalignment. No large effusion. Mild patellofemoral degenerative changes. Mild changes of the medial compartment. IMPRESSION: Mild degenerative changes.  No acute osseous abnormality. Electronically Signed   By: Donavan Foil M.D.   On: 12/22/2016  18:42        Scheduled Meds: . aspirin EC  81 mg Oral Daily  . aspirin  325 mg Oral Once  . atorvastatin  40 mg Oral q1800  . diazepam  10 mg Oral QHS  . escitalopram  20 mg Oral Daily  . famotidine  10 mg Oral BID  . gabapentin  300 mg Oral TID  . heparin  5,000 Units Subcutaneous Q8H  . prazosin  5 mg Oral QHS  . QUEtiapine  100 mg Oral QHS   Continuous Infusions: . sodium chloride 125 mL/hr at 12/23/16 1930     LOS: 0 days    Time spent: 64 min    [place], DO [facility] Pager [phone]  If 7PM-7AM, please contact night-coverage [email or web address] Password [facility] 12/24/2016, 12:38 PM

## 2016-12-24 NOTE — Progress Notes (Signed)
OT Cancellation Note  Patient Details Name: Perry Cervantes MRN: [id number] DOB: 08-02-1950   Cancelled Treatment:    Reason Eval/Treat Not Completed: Patient not medically ready (Pt with active bed rest order. Will follow.)  Malka So 12/24/2016, 10:47 AM  12/24/2016 Nestor Lewandowsky, OTR/L Pager: [phone]

## 2016-12-24 NOTE — Consult Note (Signed)
Cardiology Consultation:   Patient ID: Perry Cervantes; [id number]; 03/17/1951   Admit date: 12/23/2016 Date of Consult: 12/24/2016  Primary Care Provider: Monico Blitz, MD Consulting Cardiologist: Dr. Satira Cervantes   Patient Profile:   Perry Cervantes is a 66 y.o. male with a hx of HTN, tobacco abuse, chronic back pain, depression, and PTSD, who was admitted for weakness and multiple falls, found to have rhabdomyolysis, AKI, and a new LBBB, for which cardiology has been consulted at the request of Dr. Eliseo Cervantes Dr. Internal Medicine.   History of Present Illness:   He has no known prior cardiac history. His cardiac risk factors include HTN and tobacco abuse. He has been smoking for over 40 years. Smokes ~0.5ppd. He denies any h/o DM or HLD. No family h/o heart disease of SCD.   As outlined above, he has been experiencing lower extremity weakness resulting in multiple falls. He denies syncope/LOC.  W/u revealed Rhabdomyolysis and AKI. Admit SCr was 2.16. Improved today to 1.6. He has been admitted by IM for treatment. Cardiology consulted for new LBBB. His initial EKG at time of admit showed NSR w Left anterior fascicular block. However repeat EKG showed a new LBBB in the setting of sinus tach with rate at 110 bpm. He denies any anginal symptomatology. No chest pain or dyspnea. He also denies exertional symptoms. Troponin I negative.  2D Echo pending. Telemetry now shows NSR. He is currently asymptomatic.   Past Medical History:  Diagnosis Date  . Anxiety   . Chronic back pain    Spondylolisthesis, spondylosis, scoliosis, and radiculopathy  . Fibromyalgia   . Gastroesophageal reflux disease   . History of colon polyps   . History of kidney stones   . Hypertension   . Mild depression ([place])   . PTSD (post-traumatic stress disorder)   . Spondylolysis of lumbar region     Past Surgical History:  Procedure Laterality Date  . BACK SURGERY     x 2  . COLONOSCOPY  03/13/2005     RMR: Normal rectum. Diminutive polyp at hepatic flexure cold biopsied/removed (inflamed, benign). Otherwise normal colon, normal terminal ileum  . COLONOSCOPY N/A 07/28/2012   RMR: Colonic diverticulosis. diminutive colonic polyps (hyerplastic)-removed as described above. Colonic erosion ulceration ad described above. Status post biopsy. I suspect NSAID insult to his colon rather than inflammatory bowel disease . These finding could easily explain Hemocult-positve stool. EGD not needed at this time. Next TCS 07/2022.  . ESOPHAGOGASTRODUODENOSCOPY  03/13/2005   RMR: Small hiatal hernia  . NECK SURGERY  2008   fusion  . Right knee arthroscopy     x 2     Home Medications:  Prior to Admission medications   Medication Sig Start Date End Date Taking? Authorizing Provider  amLODipine (NORVASC) 10 MG tablet Take 10 mg by mouth daily.  03/06/12  Yes [provider]  aspirin 81 MG tablet Take 81 mg by mouth daily.   Yes [provider]  bisoprolol-hydrochlorothiazide (ZIAC) 10-6.25 MG per tablet Take 1 tablet by mouth daily.  03/06/12  Yes [provider]  diazepam (VALIUM) 10 MG tablet Take one twice a day and two at bedtime Patient taking differently: Take 10-20 mg by mouth 3 (three) times daily. Take one twice a day and two at bedtime 09/20/16  Yes Perry Spring, MD  escitalopram (LEXAPRO) 20 MG tablet Take 1 tablet (20 mg total) by mouth 2 (two) times daily. 11/02/16 11/02/17 Yes Harrington Challenger,  Su Ley, MD  gabapentin (NEURONTIN) 300 MG capsule Take 300 mg by mouth 3 (three) times daily.  12/08/14  Yes [provider]  ibuprofen (ADVIL,MOTRIN) 800 MG tablet Take 1 tablet (800 mg total) by mouth every 8 (eight) hours as needed for moderate pain. 12/22/16  Yes Perry Ferguson, MD  lisinopril (PRINIVIL,ZESTRIL) 5 MG tablet Take 5 mg by mouth daily.  03/06/12  Yes [provider]  loratadine (CLARITIN) 10 MG tablet Take 10 mg by mouth daily as needed for allergies.    Yes [provider]  pantoprazole (PROTONIX) 40 MG tablet TAKE (1) TABLET BY MOUTH ONCE DAILY. 08/08/16  Yes Carlis Stable, NP  prazosin (MINIPRESS) 5 MG capsule Take 1 capsule (5 mg total) by mouth at bedtime. 11/02/16  Yes Perry Spring, MD  QUEtiapine (SEROQUEL) 100 MG tablet Take 100 mg by mouth at bedtime.  11/21/16  Yes [provider]  ranitidine (ZANTAC) 150 MG capsule Take 150 mg by mouth 2 (two) times daily.   Yes [provider]    Inpatient Medications: Scheduled Meds: . aspirin EC  81 mg Oral Daily  . aspirin  325 mg Oral Once  . atorvastatin  40 mg Oral q1800  . diazepam  10 mg Oral QHS  . escitalopram  20 mg Oral Daily  . famotidine  10 mg Oral BID  . gabapentin  300 mg Oral TID  . heparin  5,000 Units Subcutaneous Q8H  . prazosin  5 mg Oral QHS  . QUEtiapine  100 mg Oral QHS   Continuous Infusions: . sodium chloride 125 mL/hr at 12/23/16 1930   PRN Meds: HYDROcodone-acetaminophen, labetalol, senna-docusate  Allergies:   No Known Allergies  Social History:   Social History   Social History  . Marital status: Married    Spouse name: N/A  . Number of children: 5  . Years of education: N/A   Occupational History  . retired from city of [place] Retired   Social History Main Topics  . Smoking status: Current Every Day Smoker    Packs/day: 1.00    Years: 25.00    Types: Cigarettes  . Smokeless tobacco: Never Used  . Alcohol use Yes     Comment: occasional   . Drug use: Yes    Types: Marijuana     Comment: one month ago  used marijuana   . Sexual activity: Not Currently   Other Topics Concern  . Not on file   Social History Narrative  . No narrative on file    Family History:    Family History  Problem Relation Age of Onset  . Heart attack Mother   . Other Father        deceased age 62 of Staph sepsis  . Colon cancer Neg Hx      ROS:  Please see the history of present illness.  Review of Systems  Constitution:  Positive for weakness.  Cardiovascular: Negative for chest pain, dyspnea on exertion, irregular heartbeat and syncope.  Neurological: Negative for loss of balance.    All other ROS reviewed and negative.     Physical Exam/Data:   Vitals:   12/23/16 2130 12/23/16 2200 12/23/16 2342 12/24/16 0657  BP: 136/80 124/71 (!) 142/78 (!) 92/42  Pulse: 83 80 83 72  Resp: 20 17 20 18   Temp:   98 F (36.7 C) 97.9 F (36.6 C)  TempSrc:    Oral  SpO2: 93% 95% 96% 93%  Weight:  Height:   5\' 9"  (1.753 m)     Intake/Output Summary (Last 24 hours) at 12/24/16 1429 Last data filed at 12/24/16 1226  Gross per 24 hour  Intake              240 ml  Output              300 ml  Net              -60 ml   Filed Weights   12/23/16 1348  Weight: 175 lb (79.4 kg)   Body mass index is 25.84 kg/m.   General:  Somewhat disheveled, in no acute distress HEENT: normal Lymph: no adenopathy Neck: no JVD Endocrine:  No thryomegaly Vascular: No carotid bruits; FA pulses 2+ bilaterally without bruits  Cardiac:  normal S1, S2; RRR; no murmur  Lungs:  clear to auscultation bilaterally, no wheezing, rhonchi or rales  Abd: soft, nontender, no hepatomegaly  Ext: no edema Musculoskeletal:  No deformities, BUE and BLE strength normal and equal Skin: warm and dry  Neuro:  CNs 2-12 intact, no focal abnormalities noted Psych:  Normal affect   EKG:  The EKG was personally reviewed and demonstrates:   Initial EKG 12/23/17 at [place] w/ Left anterior fascicular block,Low voltage, precordial leads Repeat EKG 12/23/16 at 2306 sinus tach 110 bpm with LBBB.  Telemetry:  Telemetry was personally reviewed and demonstrates:  NSR  Relevant CV Studies: 2D echo pending  Laboratory Data:  Chemistry  Recent Labs Lab 12/23/16 1418 12/24/16 0948  NA 136 136  K 4.7 3.9  CL 103 106  CO2 19* 20*  GLUCOSE 93 149*  BUN 39* 37*  CREATININE 2.16* 1.68*  CALCIUM 8.8* 8.1*  GFRNONAA 31* 41*  GFRAA 35* 48*  ANIONGAP  14 10     Recent Labs Lab 12/23/16 1418 12/24/16 0948  PROT 6.5 5.1*  ALBUMIN 4.1 3.1*  AST 198* 79*  ALT 58 38  ALKPHOS 70 53  BILITOT 1.5* 0.5   Hematology  Recent Labs Lab 12/23/16 1530  WBC 16.2*  RBC 4.50  HGB 15.6  HCT 44.3  MCV 98.4  MCH 34.7*  MCHC 35.2  RDW 13.5  PLT 249   Cardiac Enzymes  Recent Labs Lab 12/24/16 1244  TROPONINI <0.03   No results for input(s): TROPIPOC in the last 168 hours.   Radiology/Studies:  Dg Ribs Bilateral W/chest  Result Date: 12/23/2016 CLINICAL DATA:  Fall last night.  Pain and weakness EXAM: BILATERAL RIBS AND CHEST - 4+ VIEW COMPARISON:  None. FINDINGS: Normal cardiac silhouette. Cervical fusion. No pneumothorax or pulmonary contusion. Dedicated views of the ribs demonstrate no displaced fracture. IMPRESSION: No rib fracture or thoracic trauma. Electronically Signed   By: Suzy Bouchard M.D.   On: 12/23/2016 20:23   Dg Elbow Complete Right (3+view)  Result Date: 12/22/2016 CLINICAL DATA:  Right elbow abrasion, fall EXAM: RIGHT ELBOW - COMPLETE 3+ VIEW COMPARISON:  None. FINDINGS: There is no evidence of fracture, dislocation, or joint effusion. There is no evidence of arthropathy or other focal bone abnormality. Soft tissues are unremarkable. IMPRESSION: Negative. Electronically Signed   By: Donavan Foil M.D.   On: 12/22/2016 18:10   Ct Head Wo Contrast  Result Date: 12/23/2016 CLINICAL DATA:  66 year old male with fall yesterday with headache. Ataxia. History of cervical fusion. EXAM: CT HEAD WITHOUT CONTRAST CT CERVICAL SPINE WITHOUT CONTRAST TECHNIQUE: Multidetector CT imaging of the head and cervical spine was performed following the standard protocol  without intravenous contrast. Multiplanar CT image reconstructions of the cervical spine were also generated. COMPARISON:  04/26/2006 cervical spine radiographs and 04/17/2006 cervical spine MR. FINDINGS: CT HEAD FINDINGS Brain: No evidence of acute infarction, hemorrhage,  hydrocephalus, extra-axial collection or mass lesion/mass effect. Mild atrophy and chronic small-vessel white matter ischemic changes are noted. Vascular: Intracranial atherosclerotic calcifications noted. Skull: Normal. Negative for fracture or focal lesion. Sinuses/Orbits: No acute finding. Other: None CT CERVICAL SPINE FINDINGS Alignment: Normal. Skull base and vertebrae: No acute fracture. No primary bone lesion or focal pathologic process. Soft tissues and spinal canal: No prevertebral fluid or swelling. No visible canal hematoma. Disc levels: Anterior fusion from C5-C7 noted. Mild to moderate degenerative disc disease/ spondylosis at C3-4 and mild multilevel facet arthropathy identified contributing to mild central spinal and bony foraminal narrowing at several levels. A moderate central disc protrusion at C4-5 noted. Upper chest: No acute abnormality Other: None IMPRESSION: 1. No evidence of acute intracranial abnormality. 2. No static evidence of acute injury to the cervical spine. 3. Moderate central disc protrusion at C4-5. 4. Mild cerebral atrophy and chronic small-vessel white matter ischemic changes. 5. C5-C7 anterior fusion.  Multilevel degenerative changes. Electronically Signed   By: Margarette Canada M.D.   On: 12/23/2016 16:01   Ct Cervical Spine Wo Contrast  Result Date: 12/23/2016 CLINICAL DATA:  66 year old male with fall yesterday with headache. Ataxia. History of cervical fusion. EXAM: CT HEAD WITHOUT CONTRAST CT CERVICAL SPINE WITHOUT CONTRAST TECHNIQUE: Multidetector CT imaging of the head and cervical spine was performed following the standard protocol without intravenous contrast. Multiplanar CT image reconstructions of the cervical spine were also generated. COMPARISON:  04/26/2006 cervical spine radiographs and 04/17/2006 cervical spine MR. FINDINGS: CT HEAD FINDINGS Brain: No evidence of acute infarction, hemorrhage, hydrocephalus, extra-axial collection or mass lesion/mass effect. Mild  atrophy and chronic small-vessel white matter ischemic changes are noted. Vascular: Intracranial atherosclerotic calcifications noted. Skull: Normal. Negative for fracture or focal lesion. Sinuses/Orbits: No acute finding. Other: None CT CERVICAL SPINE FINDINGS Alignment: Normal. Skull base and vertebrae: No acute fracture. No primary bone lesion or focal pathologic process. Soft tissues and spinal canal: No prevertebral fluid or swelling. No visible canal hematoma. Disc levels: Anterior fusion from C5-C7 noted. Mild to moderate degenerative disc disease/ spondylosis at C3-4 and mild multilevel facet arthropathy identified contributing to mild central spinal and bony foraminal narrowing at several levels. A moderate central disc protrusion at C4-5 noted. Upper chest: No acute abnormality Other: None IMPRESSION: 1. No evidence of acute intracranial abnormality. 2. No static evidence of acute injury to the cervical spine. 3. Moderate central disc protrusion at C4-5. 4. Mild cerebral atrophy and chronic small-vessel white matter ischemic changes. 5. C5-C7 anterior fusion.  Multilevel degenerative changes. Electronically Signed   By: Margarette Canada M.D.   On: 12/23/2016 16:01   Dg Knee Complete 4 Views Left  Result Date: 12/22/2016 CLINICAL DATA:  Fall with red and warm knee EXAM: LEFT KNEE - COMPLETE 4+ VIEW COMPARISON:  None. FINDINGS: No fracture or malalignment. No large effusion. Mild patellofemoral degenerative changes. Mild changes of the medial compartment. IMPRESSION: Mild degenerative changes.  No acute osseous abnormality. Electronically Signed   By: Donavan Foil M.D.   On: 12/22/2016 18:42    Assessment and Plan:   1. LBBB: Initial EKG at time of admit did not show LBBB, however repeat EKG showed LBBB in the setting of sinus tachycardia with rate of 110 bpm. Suspect rate related LBBB. He denies  any cardiac symptoms. No CP, dyspnea or exertional symptoms. We recommended obtaining 2D echo to assess LVF  and r/o structural abnormalalities and check cardiac enzymes x 3. If negative w/u, would not pursue any additional cardiac testing.  2.  Rhabdomyolysis: management per IM.   3. AKI:  Admit SCr 2.16. improved today at 1.6. Receiving IVFs. Management per IM.   MD to follow with further recs.  Signed, Ellen Henri, PA-C 12/24/2016 2:29 PM    Attending note:  Patient seen and examined. Reviewed records and discussed the case with Ms. [employer]. Patient presents with recurring falls, describes chronic back pain and leg weakness particularly on the right side.He came to the hospital after a fall and subsequent inability to get up off the floor for several hours, has evidence of rhabdomyolysis by CK levels, also acute renal insufficiency with creatinine up to 2.16. Cardiology is consulted due to finding of a left bundle branch block by ECG. Initial tracing did not show left bundle branch block, repeat tracing in the setting of sinus tachycardia did reveal a left bundle branch block without chest pain and with troponin I level noted subsequently to be normal. He is currently on telemetry showing sinus rhythm and what looks to be a narrow complex QRS.  On examination he describes being anxious. Recent heart rate in the 70s to 80s and in sinus rhythm by telemetry which I personally reviewed. Lungs exhibit decreased breath sounds but no crackles, cardiac exam with RRR no gallop.lab work shows creatinine down to 1.68, total CK 11,003 down to 4444, troponin I less than 0.03. Repeat ECG today ordered and pending.  Left bundle branch block noted by ECG in the absence of chest pain. Suspect that this may be a rate related left bundle branch block since patient was tachycardic at that time. Subsequent telemetry shows sinus rhythm with what looks to be a narrow complex QRS and follow-up ECG is pending. Troponin I negative further argues against ACS. Echocardiogram has been ordered to assess cardiac structure  and function. Doubt that further cardiology testing is indicated at this point and will likely sign off if LVEF normal.  Perry Cervantes, M.D., F.A.C.C.

## 2016-12-24 NOTE — Progress Notes (Signed)
PT Cancellation Note  Patient Details Name: PLES TRUDEL MRN: [id number] DOB: Apr 26, 1950   Cancelled Treatment:    Reason Eval/Treat Not Completed: Medical issues which prohibited therapy Pt with current bed rest orders. Will hold until bed rest orders removed. Will follow up as schedule allows.   Leighton Ruff, PT, DPT  Acute Rehabilitation Services  Pager: [phone]    Rudean Hitt 12/24/2016, 9:50 AM

## 2016-12-24 NOTE — Progress Notes (Signed)
*  PRELIMINARY RESULTS* Echocardiogram 2D Echocardiogram has been performed.  Samuel Germany 12/24/2016, 2:57 PM

## 2016-12-25 ENCOUNTER — Inpatient Hospital Stay (HOSPITAL_COMMUNITY): Payer: Medicare Other

## 2016-12-25 DIAGNOSIS — T796XXA Traumatic ischemia of muscle, initial encounter: Secondary | ICD-10-CM

## 2016-12-25 DIAGNOSIS — I639 Cerebral infarction, unspecified: Secondary | ICD-10-CM

## 2016-12-25 DIAGNOSIS — I6300 Cerebral infarction due to thrombosis of unspecified precerebral artery: Secondary | ICD-10-CM

## 2016-12-25 DIAGNOSIS — W19XXXS Unspecified fall, sequela: Secondary | ICD-10-CM

## 2016-12-25 LAB — COMPREHENSIVE METABOLIC PANEL
ALT: 37 U/L (ref 17–63)
AST: 60 U/L — ABNORMAL HIGH (ref 15–41)
Albumin: 3.1 g/dL — ABNORMAL LOW (ref 3.5–5.0)
Alkaline Phosphatase: 53 U/L (ref 38–126)
Anion gap: 6 (ref 5–15)
BILIRUBIN TOTAL: 0.6 mg/dL (ref 0.3–1.2)
BUN: 22 mg/dL — ABNORMAL HIGH (ref 6–20)
CHLORIDE: 111 mmol/L (ref 101–111)
CO2: 22 mmol/L (ref 22–32)
Calcium: 8.4 mg/dL — ABNORMAL LOW (ref 8.9–10.3)
Creatinine, Ser: 1.04 mg/dL (ref 0.61–1.24)
Glucose, Bld: 105 mg/dL — ABNORMAL HIGH (ref 65–99)
POTASSIUM: 4 mmol/L (ref 3.5–5.1)
Sodium: 139 mmol/L (ref 135–145)
TOTAL PROTEIN: 5.1 g/dL — AB (ref 6.5–8.1)

## 2016-12-25 LAB — CBC
HCT: 36.7 % — ABNORMAL LOW (ref 39.0–52.0)
Hemoglobin: 12.6 g/dL — ABNORMAL LOW (ref 13.0–17.0)
MCH: 34.4 pg — ABNORMAL HIGH (ref 26.0–34.0)
MCHC: 34.3 g/dL (ref 30.0–36.0)
MCV: 100.3 fL — AB (ref 78.0–100.0)
PLATELETS: 191 10*3/uL (ref 150–400)
RBC: 3.66 MIL/uL — AB (ref 4.22–5.81)
RDW: 13.7 % (ref 11.5–15.5)
WBC: 8.3 10*3/uL (ref 4.0–10.5)

## 2016-12-25 LAB — RAPID URINE DRUG SCREEN, HOSP PERFORMED
AMPHETAMINES: NOT DETECTED
BARBITURATES: NOT DETECTED
Benzodiazepines: POSITIVE — AB
Cocaine: NOT DETECTED
OPIATES: NOT DETECTED
TETRAHYDROCANNABINOL: NOT DETECTED

## 2016-12-25 LAB — HIV ANTIBODY (ROUTINE TESTING W REFLEX): HIV Screen 4th Generation wRfx: NONREACTIVE

## 2016-12-25 LAB — CK: CK TOTAL: 2734 U/L — AB (ref 49–397)

## 2016-12-25 MED ORDER — STROKE: EARLY STAGES OF RECOVERY BOOK
Freq: Once | Status: DC
  Filled 2016-12-25: qty 1

## 2016-12-25 MED ORDER — ASPIRIN 325 MG PO TABS
325.0000 mg | ORAL_TABLET | Freq: Every day | ORAL | Status: DC
  Administered 2016-12-25 – 2016-12-26 (×2): 325 mg via ORAL
  Filled 2016-12-25 (×2): qty 1

## 2016-12-25 MED ORDER — SODIUM CHLORIDE 0.9 % IV BOLUS (SEPSIS)
500.0000 mL | Freq: Once | INTRAVENOUS | Status: AC
  Administered 2016-12-25: 500 mL via INTRAVENOUS

## 2016-12-25 MED ORDER — SODIUM CHLORIDE 0.9 % IV BOLUS (SEPSIS)
500.0000 mL | Freq: Once | INTRAVENOUS | Status: DC
Start: 2016-12-25 — End: 2016-12-25

## 2016-12-25 NOTE — Consult Note (Signed)
Neurology Consultation  Reason for Consult: Acute stroke Referring Physician: Dr. Lucianne Lei  CC: Right-sided weakness  History is obtained from:*Patient and chart HPI: Perry Cervantes is a 66 y.o. male who has a past medical history of depression, PTSD, hypertension presented for evaluation of complaints of multiple falls over the past few days on 12/23/2016. He was found down because he was feeling weak and was unable to support his weight on the right side. He had elevated CK on arrival. He was admitted for evaluation of a possible stroke versus a spinal cord pathology. MRI of the brain was done that revealed a left corona radiata stroke. Neurology service was then consulted for recommendations on management of the stroke and stroke risk factor workup. Next line patient denies any preceding illnesses sicknesses. He denies any nausea vomiting. Denies any visual complaints. Denies chest pain shortness of breath.   LKW: Sometime on 12/22/2016 tpa given?: no, outside the window Premorbid modified Rankin scale (mRS):  0-Completely asymptomatic and back to baseline post-stroke  ROS: A 14 point ROS was performed and is negative except as noted in the HPI.   Past Medical History:  Diagnosis Date  . Anxiety   . Chronic back pain    Spondylolisthesis, spondylosis, scoliosis, and radiculopathy  . Fibromyalgia   . Gastroesophageal reflux disease   . History of colon polyps   . History of kidney stones   . Hypertension   . Mild depression ([place])   . PTSD (post-traumatic stress disorder)   . Spondylolysis of lumbar region     Family History  Problem Relation Age of Onset  . Heart attack Mother   . Other Father        deceased age 49 of Staph sepsis  . Colon cancer Neg Hx      Social History:   reports that he has been smoking Cigarettes.  He has a 25.00 pack-year smoking history. He has never used smokeless tobacco. He reports that he drinks alcohol. He reports that he uses drugs,  including Marijuana.  Medications  Current Facility-Administered Medications:  .   stroke: mapping our early stages of recovery book, , Does not apply, Once, Eulogio Bear U, DO .  aspirin tablet 325 mg, 325 mg, Oral, Daily, Vann, Jessica U, DO, 325 mg at 12/25/16 1045 .  atorvastatin (LIPITOR) tablet 40 mg, 40 mg, Oral, q1800, Emokpae, Ejiroghene E, MD, 40 mg at 12/25/16 1733 .  diazepam (VALIUM) tablet 10 mg, 10 mg, Oral, Q12H PRN, Eulogio Bear U, DO, 10 mg at 12/25/16 0931 .  diazepam (VALIUM) tablet 5 mg, 5 mg, Oral, QHS PRN, Eulogio Bear U, DO, 5 mg at 12/24/16 2156 .  escitalopram (LEXAPRO) tablet 20 mg, 20 mg, Oral, Daily, Emokpae, Ejiroghene E, MD, 20 mg at 12/25/16 0931 .  famotidine (PEPCID) tablet 10 mg, 10 mg, Oral, BID, Emokpae, Ejiroghene E, MD, 10 mg at 12/25/16 0930 .  gabapentin (NEURONTIN) capsule 300 mg, 300 mg, Oral, TID, Emokpae, Ejiroghene E, MD, 300 mg at 12/25/16 1503 .  heparin injection 5,000 Units, 5,000 Units, Subcutaneous, Q8H, Emokpae, Ejiroghene E, MD, 5,000 Units at 12/25/16 1315 .  HYDROcodone-acetaminophen (NORCO/VICODIN) 5-325 MG per tablet 1-2 tablet, 1-2 tablet, Oral, Q4H PRN, Emokpae, Ejiroghene E, MD, 2 tablet at 12/25/16 1503 .  labetalol (NORMODYNE,TRANDATE) injection 10 mg, 10 mg, Intravenous, Q4H PRN, Emokpae, Ejiroghene E, MD .  prazosin (MINIPRESS) capsule 5 mg, 5 mg, Oral, QHS, Emokpae, Ejiroghene E, MD, 5 mg at 12/24/16 2156 .  QUEtiapine (SEROQUEL) tablet 100 mg, 100 mg, Oral, QHS, Emokpae, Ejiroghene E, MD, 100 mg at 12/24/16 2127 .  senna-docusate (Senokot-S) tablet 1 tablet, 1 tablet, Oral, QHS PRN, Emokpae, Ejiroghene E, MD   Exam: Current vital signs: BP (!) 146/70   Pulse 91   Temp 98.5 F (36.9 C) (Oral)   Resp 18   Ht 5\' 9"  (1.753 m)   Wt 79.4 kg (175 lb)   SpO2 95%   BMI 25.84 kg/m  Vital signs in last 24 hours: Temp:  [97.4 F (36.3 C)-98.6 F (37 C)] 98.5 F (36.9 C) (09/04 1359) Pulse Rate:  [81-109] 91 (09/04  1359) Resp:  [18] 18 (09/04 1026) BP: (125-151)/(70-82) 146/70 (09/04 1359) SpO2:  [91 %-95 %] 95 % (09/04 1359)  GENERAL: Awake, alert in NAD HEENT: - Normocephalic and atraumatic, dry mm, no LN++, no Thyromegally LUNGS - Clear to auscultation bilaterally with no wheezes CV - S1S2 RRR, no m/r/g, equal pulses bilaterally. ABDOMEN - Soft, nontender, nondistended with normoactive BS Ext: warm, well perfused, intact peripheral pulses,no edema  NEURO:  Mental Status: AA&Ox3  Language: speech is mildly dysarthric.  Naming, repetition, fluency, and comprehension intact Cranial Nerves: PERRL61mm/brisk. EOMI, visual fields full, mild right nasolabial fold flattening facial sensation intact, hearing intact, tongue/uvula/soft palate midline, normal sternocleidomastoid and trapezius muscle strength. No evidence of tongue atrophy or fibrillations Motor: 1/5 right upper extremity, 4/5 right lower extremity, 5/5 left upper and lower extremity. Tone: is normal and bulk is normal Sensation- Intact to light touch bilaterally, no extinction Coordination: FTN intact on the left Gait- deferred  NIHSS 1a Level of Conscious.: 00  1b LOC Questions: 0  1c LOC Commands: 0  2 Best Gaze: 0  3 Visual: 0  4 Facial Palsy: 1 5a Motor Arm - left: 0  5b Motor Arm - Right: 4  6a Motor Leg - Left: 0  6b Motor Leg - Right: 1  7 Limb Ataxia: 0  8 Sensory: 0  9 Best Language: 0  10 Dysarthria: 1  11 Extinct. and Inatten.: 0 TOTAL: 6    Labs I have reviewed labs in epic and the results pertinent to this consultation are:  CBC    Component Value Date/Time   WBC 8.3 12/25/2016 0539   RBC 3.66 (L) 12/25/2016 0539   HGB 12.6 (L) 12/25/2016 0539   HCT 36.7 (L) 12/25/2016 0539   HCT 52 02/26/2012 1310   PLT 191 12/25/2016 0539   MCV 100.3 (H) 12/25/2016 0539   MCV 98.0 02/26/2012 1310   MCH 34.4 (H) 12/25/2016 0539   MCHC 34.3 12/25/2016 0539   RDW 13.7 12/25/2016 0539   LYMPHSABS 3.1 12/23/2016 1530    MONOABS 0.8 12/23/2016 1530   EOSABS 0.4 12/23/2016 1530   BASOSABS 0.0 12/23/2016 1530    CMP     Component Value Date/Time   NA 139 12/25/2016 0539   NA 138 02/26/2012 1309   K 4.0 12/25/2016 0539   K 4.5 02/26/2012 1309   CL 111 12/25/2016 0539   CO2 22 12/25/2016 0539   GLUCOSE 105 (H) 12/25/2016 0539   BUN 22 (H) 12/25/2016 0539   BUN 22 (A) 02/26/2012 1309   CREATININE 1.04 12/25/2016 0539   CREATININE 1.63 02/26/2012 1309   CALCIUM 8.4 (L) 12/25/2016 0539   CALCIUM 9.9 02/26/2012 1309   PROT 5.1 (L) 12/25/2016 0539   ALBUMIN 3.1 (L) 12/25/2016 0539   ALBUMIN 5.1 02/26/2012 1309   AST 60 (H) 12/25/2016 0539  AST 14 02/26/2012 1309   ALT 37 12/25/2016 0539   ALKPHOS 53 12/25/2016 0539   ALKPHOS 88 02/26/2012 1309   BILITOT 0.6 12/25/2016 0539   BILITOT 0.6 02/26/2012 1309   GFRNONAA >60 12/25/2016 0539   GFRAA >60 12/25/2016 0539   UDS only positive for benzodiazepines. Lipid Panel     Component Value Date/Time   CHOL 174 12/24/2016 0010   TRIG 167 (H) 12/24/2016 0010   HDL 34 (L) 12/24/2016 0010   CHOLHDL 5.1 12/24/2016 0010   VLDL 33 12/24/2016 0010   LDLCALC 107 (H) 12/24/2016 0010     Imaging I have reviewed the images obtained:  CT-scan of the brainchanges  MRI examination of the brain shows evidence of left corona radiata stroke.  Assessment:  66 year old man with aforementioned past medical history presented for evaluation of right-sided weakness that led to multiple falls. MRI done today reveals a lacunar stroke in the left corona radiata. Most likely a small vessel disease etiology stroke.   Recommendations 1. HgbA1c, fasting lipid panel 2. MRA  of the brain without contrast 3. Frequent neuro checks 4. Echocardiogram 5. Carotid dopplers 6. Prophylactic therapy-Antiplatelet med: Aspirin - dose 325mg  PO or 300mg  PR 7. Risk factor modification 8. Telemetry monitoring 9. PT consult, OT consult, Speech consult 10. please page stroke NP   Or  PA  Or MD  from 8am -4 pm as this patient will be followed by the stroke team at this point.   You can look them up on [email or web address]    Amie Portland, MD [facility] [phone]  If 7pm to 7am, please call on call as listed on AMION.

## 2016-12-25 NOTE — Progress Notes (Signed)
PROGRESS NOTE    Perry Cervantes  FXT:[id number] DOB: Sep 09, 1950 DOA: 12/23/2016 PCP: Monico Blitz, MD   Outpatient Specialists:     Brief Narrative:   Perry Cervantes is a 66 y.o. male with medical history significant for chronic back pain, depression, hypertension, PTSD. Patient presented with complaints of multiple falls. Over the past 2 years he has been falling more frequently but reports since Saturday morning- 12/22/16, he is falling much more than usual. He describes trying to stand up and suddenly feeling like his right lower extremity had "gone to sleep", also with weakness in his right upper extremity. Since Saturday morning he has fallen at least 3 times. Patient denies vision change, no slurred, no difficulty swallowing, no facial asymmetry, no bowel or urinary problems.   Patient was in the ED 12/22/16, with complaints of fall hurting his right elbow and left knee. Right elbow x-rays and left knee x-rays were unremarkable, as patient was discharged home to follow-up with PCP., But patient presented again because he fell while trying to get out of the car when he got home. He was on the floor for several hours, as he felt he would be able to get up at some point of he kept trying, his wife could not help him up and subsequently patient presented to the ED again after calling 911.    Assessment & Plan:   Principal Problem:   Right sided weakness Active Problems:   PTSD (post-traumatic stress disorder)   Depression   Chronic back pain   AKI (acute kidney injury) ([place])   Rhabdomyolysis   CVA (cerebral vascular accident) ([place])   CVA -MRI + for 2 cm acute infarct in the left corona radiata -LDL: 107-- on statin-- after acute rhabdo episode over, may need to increase HgbA1c: 4.9 Echo: - Left ventricle: The cavity size was normal. Systolic function was   normal. The estimated ejection fraction was in the range of 60%   to 65%. Wall motion was normal; there were no  regional wall   motion abnormalities. Left ventricular diastolic function   parameters were normal. - Mitral valve: Valve area by pressure half-time: 1.38 cm^2. Carotids -neuro consult -PT/OT- SNF -ASA -SLP eval   New LBBB- prob rate dependant -troponin negative -echo unremarkable -cardiology consulted  AKI -IVF  Rhabdomyolysis -IVF, CK trending down  PTSD -on seroquel-- monitor Qtc    DVT prophylaxis:  Lovenox   Code Status: Full Code   Family Communication: None at bedside  Disposition Plan:     Consultants:   Cards  neuro  Procedures:      Subjective: Able to move leg more this AM-- did some exercises in bed yesterday  Objective: Vitals:   12/24/16 1415 12/24/16 2134 12/25/16 0521 12/25/16 1026  BP: 116/63 (!) 142/82 125/70 (!) 151/75  Pulse: 71 81 85 (!) 109  Resp: 18 18 18 18   Temp: 98 F (36.7 C) (!) 97.4 F (36.3 C) 98.3 F (36.8 C) 98.6 F (37 C)  TempSrc: Oral Oral Oral Oral  SpO2: 96% 91% 93% 93%  Weight:      Height:        Intake/Output Summary (Last 24 hours) at 12/25/16 1244 Last data filed at 12/25/16 0522  Gross per 24 hour  Intake          2683.75 ml  Output              800 ml  Net  1883.75 ml   Filed Weights   12/23/16 1348  Weight: 79.4 kg (175 lb)    Examination:  General exam: in bed, leaning to left Respiratory system: no wheezing Cardiovascular system: rrr Gastrointestinal system: +BS, soft Central nervous system: Alert and oriented.  Extremities: impaired dorsiflexon on the right leg. No movement in right arm     Data Reviewed: I have personally reviewed following labs and imaging studies  CBC:  Recent Labs Lab 12/23/16 1530 12/25/16 0539  WBC 16.2* 8.3  NEUTROABS 11.9*  --   HGB 15.6 12.6*  HCT 44.3 36.7*  MCV 98.4 100.3*  PLT 249 557   Basic Metabolic Panel:  Recent Labs Lab 12/23/16 1418 12/24/16 0948 12/25/16 0539  NA 136 136 139  K 4.7 3.9 4.0  CL 103 106 111    CO2 19* 20* 22  GLUCOSE 93 149* 105*  BUN 39* 37* 22*  CREATININE 2.16* 1.68* 1.04  CALCIUM 8.8* 8.1* 8.4*   GFR: Estimated Creatinine Clearance: 71.8 mL/min (by C-G formula based on SCr of 1.04 mg/dL). Liver Function Tests:  Recent Labs Lab 12/23/16 1418 12/24/16 0948 12/25/16 0539  AST 198* 79* 60*  ALT 58 38 37  ALKPHOS 70 53 53  BILITOT 1.5* 0.5 0.6  PROT 6.5 5.1* 5.1*  ALBUMIN 4.1 3.1* 3.1*   No results for input(s): LIPASE, AMYLASE in the last 168 hours. No results for input(s): AMMONIA in the last 168 hours. Coagulation Profile: No results for input(s): INR, PROTIME in the last 168 hours. Cardiac Enzymes:  Recent Labs Lab 12/23/16 1959 12/24/16 0948 12/24/16 1244 12/25/16 0539  CKTOTAL 11,003* 4,444*  --  2,734*  TROPONINI  --   --  <0.03  --    BNP (last 3 results) No results for input(s): PROBNP in the last 8760 hours. HbA1C:  Recent Labs  12/24/16 0010  HGBA1C 4.9   CBG: No results for input(s): GLUCAP in the last 168 hours. Lipid Profile:  Recent Labs  12/24/16 0010  CHOL 174  HDL 34*  LDLCALC 107*  TRIG 167*  CHOLHDL 5.1   Thyroid Function Tests: No results for input(s): TSH, T4TOTAL, FREET4, T3FREE, THYROIDAB in the last 72 hours. Anemia Panel: No results for input(s): VITAMINB12, FOLATE, FERRITIN, TIBC, IRON, RETICCTPCT in the last 72 hours. Urine analysis:    Component Value Date/Time   COLORURINE YELLOW 05/26/2009 [place] 05/26/2009 1446   LABSPEC 1.025 05/26/2009 1446   PHURINE 5.0 05/26/2009 1446   GLUCOSEU NEGATIVE 05/26/2009 1446   HGBUR LARGE (A) 05/26/2009 1446   BILIRUBINUR NEGATIVE 05/26/2009 1446   KETONESUR NEGATIVE 05/26/2009 1446   PROTEINUR NEGATIVE 05/26/2009 1446   UROBILINOGEN 0.2 05/26/2009 1446   NITRITE NEGATIVE 05/26/2009 1446   LEUKOCYTESUR TRACE (A) 05/26/2009 1446     )No results found for this or any previous visit (from the past 240 hour(s)).    Anti-infectives    None        Radiology Studies: Dg Ribs Bilateral W/chest  Result Date: 12/23/2016 CLINICAL DATA:  Fall last night.  Pain and weakness EXAM: BILATERAL RIBS AND CHEST - 4+ VIEW COMPARISON:  None. FINDINGS: Normal cardiac silhouette. Cervical fusion. No pneumothorax or pulmonary contusion. Dedicated views of the ribs demonstrate no displaced fracture. IMPRESSION: No rib fracture or thoracic trauma. Electronically Signed   By: Suzy Bouchard M.D.   On: 12/23/2016 20:23   Ct Head Wo Contrast  Result Date: 12/23/2016 CLINICAL DATA:  66 year old male with fall yesterday with  headache. Ataxia. History of cervical fusion. EXAM: CT HEAD WITHOUT CONTRAST CT CERVICAL SPINE WITHOUT CONTRAST TECHNIQUE: Multidetector CT imaging of the head and cervical spine was performed following the standard protocol without intravenous contrast. Multiplanar CT image reconstructions of the cervical spine were also generated. COMPARISON:  04/26/2006 cervical spine radiographs and 04/17/2006 cervical spine MR. FINDINGS: CT HEAD FINDINGS Brain: No evidence of acute infarction, hemorrhage, hydrocephalus, extra-axial collection or mass lesion/mass effect. Mild atrophy and chronic small-vessel white matter ischemic changes are noted. Vascular: Intracranial atherosclerotic calcifications noted. Skull: Normal. Negative for fracture or focal lesion. Sinuses/Orbits: No acute finding. Other: None CT CERVICAL SPINE FINDINGS Alignment: Normal. Skull base and vertebrae: No acute fracture. No primary bone lesion or focal pathologic process. Soft tissues and spinal canal: No prevertebral fluid or swelling. No visible canal hematoma. Disc levels: Anterior fusion from C5-C7 noted. Mild to moderate degenerative disc disease/ spondylosis at C3-4 and mild multilevel facet arthropathy identified contributing to mild central spinal and bony foraminal narrowing at several levels. A moderate central disc protrusion at C4-5 noted. Upper chest: No acute abnormality  Other: None IMPRESSION: 1. No evidence of acute intracranial abnormality. 2. No static evidence of acute injury to the cervical spine. 3. Moderate central disc protrusion at C4-5. 4. Mild cerebral atrophy and chronic small-vessel white matter ischemic changes. 5. C5-C7 anterior fusion.  Multilevel degenerative changes. Electronically Signed   By: Margarette Canada M.D.   On: 12/23/2016 16:01   Ct Cervical Spine Wo Contrast  Result Date: 12/23/2016 CLINICAL DATA:  66 year old male with fall yesterday with headache. Ataxia. History of cervical fusion. EXAM: CT HEAD WITHOUT CONTRAST CT CERVICAL SPINE WITHOUT CONTRAST TECHNIQUE: Multidetector CT imaging of the head and cervical spine was performed following the standard protocol without intravenous contrast. Multiplanar CT image reconstructions of the cervical spine were also generated. COMPARISON:  04/26/2006 cervical spine radiographs and 04/17/2006 cervical spine MR. FINDINGS: CT HEAD FINDINGS Brain: No evidence of acute infarction, hemorrhage, hydrocephalus, extra-axial collection or mass lesion/mass effect. Mild atrophy and chronic small-vessel white matter ischemic changes are noted. Vascular: Intracranial atherosclerotic calcifications noted. Skull: Normal. Negative for fracture or focal lesion. Sinuses/Orbits: No acute finding. Other: None CT CERVICAL SPINE FINDINGS Alignment: Normal. Skull base and vertebrae: No acute fracture. No primary bone lesion or focal pathologic process. Soft tissues and spinal canal: No prevertebral fluid or swelling. No visible canal hematoma. Disc levels: Anterior fusion from C5-C7 noted. Mild to moderate degenerative disc disease/ spondylosis at C3-4 and mild multilevel facet arthropathy identified contributing to mild central spinal and bony foraminal narrowing at several levels. A moderate central disc protrusion at C4-5 noted. Upper chest: No acute abnormality Other: None IMPRESSION: 1. No evidence of acute intracranial abnormality.  2. No static evidence of acute injury to the cervical spine. 3. Moderate central disc protrusion at C4-5. 4. Mild cerebral atrophy and chronic small-vessel white matter ischemic changes. 5. C5-C7 anterior fusion.  Multilevel degenerative changes. Electronically Signed   By: Margarette Canada M.D.   On: 12/23/2016 16:01   Mr Brain Wo Contrast  Result Date: 12/25/2016 CLINICAL DATA:  Stroke follow-up. Right lower extremity goes to sleep. Weakness and right upper extremity. EXAM: MRI HEAD WITHOUT CONTRAST TECHNIQUE: Multiplanar, multiecho pulse sequences of the brain and surrounding structures were obtained without intravenous contrast. COMPARISON:  Head CT from 2 days ago FINDINGS: Brain: 2 cm long area of wedge-shaped restricted diffusion crossing the left corona radiata. No acute hemorrhage, hydrocephalus, or masslike findings. Small remote left cerebellar infarct.  Mild scattered FLAIR hyperintensities in the cerebral white matter, usually from chronic small vessel ischemia, although nonspecific. No chronic hemorrhagic foci. Vascular: Absent flow void in the non dominant proximal left V4 segment which appears normalized after the pica, possible retrograde flow. Skull and upper cervical spine: Negative for marrow lesion Sinuses/Orbits: Negative IMPRESSION: 1. ~2 cm acute infarct in the left corona radiata. 2. Mild cerebral white matter disease, nonspecific but often from chronic small vessel ischemia. 3. Slow or absent flow in the proximal left V4 segment with normalized appearance at the vertebrobasilar junction. Electronically Signed   By: Monte Fantasia M.D.   On: 12/25/2016 09:00        Scheduled Meds: .  stroke: mapping our early stages of recovery book   Does not apply Once  . aspirin  325 mg Oral Daily  . atorvastatin  40 mg Oral q1800  . escitalopram  20 mg Oral Daily  . famotidine  10 mg Oral BID  . gabapentin  300 mg Oral TID  . heparin  5,000 Units Subcutaneous Q8H  . prazosin  5 mg Oral QHS    . QUEtiapine  100 mg Oral QHS   Continuous Infusions:    LOS: 1 day    Time spent: 35 min    [place], DO [facility] Pager [phone]  If 7PM-7AM, please contact night-coverage [email or web address] Password [email or web address] 12/25/2016, 12:44 PM

## 2016-12-25 NOTE — Progress Notes (Signed)
Progress Note  Patient Name: Perry Cervantes Date of Encounter: 12/25/2016  Primary Cardiologist: [place] with right hand and foot weakness no cardiac symptoms  Inpatient Medications    Scheduled Meds: .  stroke: mapping our early stages of recovery book   Does not apply Once  . aspirin  325 mg Oral Daily  . atorvastatin  40 mg Oral q1800  . escitalopram  20 mg Oral Daily  . famotidine  10 mg Oral BID  . gabapentin  300 mg Oral TID  . heparin  5,000 Units Subcutaneous Q8H  . prazosin  5 mg Oral QHS  . QUEtiapine  100 mg Oral QHS   Continuous Infusions:  PRN Meds: diazepam, diazepam, HYDROcodone-acetaminophen, labetalol, senna-docusate   Vital Signs    Vitals:   12/24/16 0657 12/24/16 1415 12/24/16 2134 12/25/16 0521  BP: (!) 92/42 116/63 (!) 142/82 125/70  Pulse: 72 71 81 85  Resp: 18 18 18 18   Temp: 97.9 F (36.6 C) 98 F (36.7 C) (!) 97.4 F (36.3 C) 98.3 F (36.8 C)  TempSrc: Oral Oral Oral Oral  SpO2: 93% 96% 91% 93%  Weight:      Height:        Intake/Output Summary (Last 24 hours) at 12/25/16 1004 Last data filed at 12/25/16 0522  Gross per 24 hour  Intake          2683.75 ml  Output             1100 ml  Net          1583.75 ml   Filed Weights   12/23/16 1348  Weight: 175 lb (79.4 kg)    Telemetry    NSR  - Personally Reviewed  ECG    NSR intermittent LBBB  - Personally Reviewed  Physical Exam   GEN: No acute distress.   Neck: No JVD Cardiac: RRR, no murmurs, rubs, or gallops.  Respiratory: Clear to auscultation bilaterally. GI: Soft, nontender, non-distended  MS: No edema; No deformity. Neuro:  night hand and foot weakness  Psych: Normal affect   Labs    Chemistry  Recent Labs Lab 12/23/16 1418 12/24/16 0948 12/25/16 0539  NA 136 136 139  K 4.7 3.9 4.0  CL 103 106 111  CO2 19* 20* 22  GLUCOSE 93 149* 105*  BUN 39* 37* 22*  CREATININE 2.16* 1.68* 1.04  CALCIUM 8.8* 8.1* 8.4*  PROT 6.5 5.1*  5.1*  ALBUMIN 4.1 3.1* 3.1*  AST 198* 79* 60*  ALT 58 38 37  ALKPHOS 70 53 53  BILITOT 1.5* 0.5 0.6  GFRNONAA 31* 41* >60  GFRAA 35* 48* >60  ANIONGAP 14 10 6      Hematology  Recent Labs Lab 12/23/16 1530 12/25/16 0539  WBC 16.2* 8.3  RBC 4.50 3.66*  HGB 15.6 12.6*  HCT 44.3 36.7*  MCV 98.4 100.3*  MCH 34.7* 34.4*  MCHC 35.2 34.3  RDW 13.5 13.7  PLT 249 191    Cardiac Enzymes  Recent Labs Lab 12/24/16 1244  TROPONINI <0.03   No results for input(s): TROPIPOC in the last 168 hours.   BNPNo results for input(s): BNP, PROBNP in the last 168 hours.   DDimer No results for input(s): DDIMER in the last 168 hours.   Radiology    Dg Ribs Bilateral W/chest  Result Date: 12/23/2016 CLINICAL DATA:  Fall last night.  Pain and weakness EXAM: BILATERAL RIBS AND CHEST - 4+ VIEW COMPARISON:  None. FINDINGS: Normal cardiac silhouette. Cervical fusion. No pneumothorax or pulmonary contusion. Dedicated views of the ribs demonstrate no displaced fracture. IMPRESSION: No rib fracture or thoracic trauma. Electronically Signed   By: Suzy Bouchard M.D.   On: 12/23/2016 20:23   Ct Head Wo Contrast  Result Date: 12/23/2016 CLINICAL DATA:  66 year old male with fall yesterday with headache. Ataxia. History of cervical fusion. EXAM: CT HEAD WITHOUT CONTRAST CT CERVICAL SPINE WITHOUT CONTRAST TECHNIQUE: Multidetector CT imaging of the head and cervical spine was performed following the standard protocol without intravenous contrast. Multiplanar CT image reconstructions of the cervical spine were also generated. COMPARISON:  04/26/2006 cervical spine radiographs and 04/17/2006 cervical spine MR. FINDINGS: CT HEAD FINDINGS Brain: No evidence of acute infarction, hemorrhage, hydrocephalus, extra-axial collection or mass lesion/mass effect. Mild atrophy and chronic small-vessel white matter ischemic changes are noted. Vascular: Intracranial atherosclerotic calcifications noted. Skull: Normal.  Negative for fracture or focal lesion. Sinuses/Orbits: No acute finding. Other: None CT CERVICAL SPINE FINDINGS Alignment: Normal. Skull base and vertebrae: No acute fracture. No primary bone lesion or focal pathologic process. Soft tissues and spinal canal: No prevertebral fluid or swelling. No visible canal hematoma. Disc levels: Anterior fusion from C5-C7 noted. Mild to moderate degenerative disc disease/ spondylosis at C3-4 and mild multilevel facet arthropathy identified contributing to mild central spinal and bony foraminal narrowing at several levels. A moderate central disc protrusion at C4-5 noted. Upper chest: No acute abnormality Other: None IMPRESSION: 1. No evidence of acute intracranial abnormality. 2. No static evidence of acute injury to the cervical spine. 3. Moderate central disc protrusion at C4-5. 4. Mild cerebral atrophy and chronic small-vessel white matter ischemic changes. 5. C5-C7 anterior fusion.  Multilevel degenerative changes. Electronically Signed   By: Margarette Canada M.D.   On: 12/23/2016 16:01   Ct Cervical Spine Wo Contrast  Result Date: 12/23/2016 CLINICAL DATA:  66 year old male with fall yesterday with headache. Ataxia. History of cervical fusion. EXAM: CT HEAD WITHOUT CONTRAST CT CERVICAL SPINE WITHOUT CONTRAST TECHNIQUE: Multidetector CT imaging of the head and cervical spine was performed following the standard protocol without intravenous contrast. Multiplanar CT image reconstructions of the cervical spine were also generated. COMPARISON:  04/26/2006 cervical spine radiographs and 04/17/2006 cervical spine MR. FINDINGS: CT HEAD FINDINGS Brain: No evidence of acute infarction, hemorrhage, hydrocephalus, extra-axial collection or mass lesion/mass effect. Mild atrophy and chronic small-vessel white matter ischemic changes are noted. Vascular: Intracranial atherosclerotic calcifications noted. Skull: Normal. Negative for fracture or focal lesion. Sinuses/Orbits: No acute finding.  Other: None CT CERVICAL SPINE FINDINGS Alignment: Normal. Skull base and vertebrae: No acute fracture. No primary bone lesion or focal pathologic process. Soft tissues and spinal canal: No prevertebral fluid or swelling. No visible canal hematoma. Disc levels: Anterior fusion from C5-C7 noted. Mild to moderate degenerative disc disease/ spondylosis at C3-4 and mild multilevel facet arthropathy identified contributing to mild central spinal and bony foraminal narrowing at several levels. A moderate central disc protrusion at C4-5 noted. Upper chest: No acute abnormality Other: None IMPRESSION: 1. No evidence of acute intracranial abnormality. 2. No static evidence of acute injury to the cervical spine. 3. Moderate central disc protrusion at C4-5. 4. Mild cerebral atrophy and chronic small-vessel white matter ischemic changes. 5. C5-C7 anterior fusion.  Multilevel degenerative changes. Electronically Signed   By: Margarette Canada M.D.   On: 12/23/2016 16:01   Mr Brain Wo Contrast  Result Date: 12/25/2016 CLINICAL DATA:  Stroke follow-up. Right lower extremity goes to sleep.  Weakness and right upper extremity. EXAM: MRI HEAD WITHOUT CONTRAST TECHNIQUE: Multiplanar, multiecho pulse sequences of the brain and surrounding structures were obtained without intravenous contrast. COMPARISON:  Head CT from 2 days ago FINDINGS: Brain: 2 cm long area of wedge-shaped restricted diffusion crossing the left corona radiata. No acute hemorrhage, hydrocephalus, or masslike findings. Small remote left cerebellar infarct. Mild scattered FLAIR hyperintensities in the cerebral white matter, usually from chronic small vessel ischemia, although nonspecific. No chronic hemorrhagic foci. Vascular: Absent flow void in the non dominant proximal left V4 segment which appears normalized after the pica, possible retrograde flow. Skull and upper cervical spine: Negative for marrow lesion Sinuses/Orbits: Negative IMPRESSION: 1. ~2 cm acute infarct in  the left corona radiata. 2. Mild cerebral white matter disease, nonspecific but often from chronic small vessel ischemia. 3. Slow or absent flow in the proximal left V4 segment with normalized appearance at the vertebrobasilar junction. Electronically Signed   By: Monte Fantasia M.D.   On: 12/25/2016 09:00    Cardiac Studies   Echo normal EF 60-65%  Patient Profile     66 y.o. male with new LBBB admitted with CVA  Assessment & Plan    1) LBBB rate related no chest pain troponin negative echo with normal EF no further w/u indicated 2) CVA: lef corona radiata infarct needs PT/OT ASA statin needs carotids checked plan per neuro/primary  Will sign off   Signed, Jenkins Rouge, MD  12/25/2016, 10:04 AM

## 2016-12-25 NOTE — Progress Notes (Signed)
Rehab Admissions Coordinator Note:  Patient was screened by Cleatrice Burke for appropriateness for an Inpatient Acute Rehab Consult per PT recommendation.   At this time, we are recommending Inpatient Rehab consult.  Cleatrice Burke 12/25/2016, 1:49 PM  I can be reached at [phone].

## 2016-12-25 NOTE — Progress Notes (Signed)
At beginning of shift, patient HR noted to be running in the 120s. Pt's HR had been running this rate since 10AM with no interventions. This RN notified Schorr,NP and Schorr,NP placed order for a 58mL bolus in addition to patient receiving his PRN 5mg  dose of valium with the rest of his bedtime medications. Patient's HR now ranging between 115-120 while sleeping and Schorr,NP notified and she stated that as long as the patient's HR does not increase to the 130s sustaining then just monitor the patient for now. Will continue to monitor and treat per MD orders.

## 2016-12-25 NOTE — Progress Notes (Signed)
**  Preliminary report by tech**  Carotid artery duplex complete. Findings are consistent with a 1-39 percent stenosis involving the right internal carotid artery and the left internal carotid artery.  The vertebral arteries demonstrate antegrade flow. The left vertebral artery demonstrates a high resistive waveform of unknown etiology.  12/25/16 4:45 PM Perry Cervantes RVT

## 2016-12-25 NOTE — Progress Notes (Signed)
PT Cancellation Note  Patient Details Name: Perry Cervantes MRN: [id number] DOB: May 06, 1950   Cancelled Treatment:    Reason Eval/Treat Not Completed: Patient at procedure or test/unavailable. Pt at MRI. PT to return as able.   Sharita Bienaime M Misbah Hornaday 12/25/2016, 8:18 AM   Kittie Plater, PT, DPT Pager #: [phone] Office #: [phone]

## 2016-12-25 NOTE — Evaluation (Signed)
Physical Therapy Evaluation Patient Details Name: Perry Cervantes MRN: [id number] DOB: 1951-03-19 Today's Date: 12/25/2016   History of Present Illness  KOLLIN UDELL JRis a 66 y.o.malewith medical history significant for chronic back pain, depression, hypertension, PTSD. Patient presented with complaints of multiple falls. Over the past 2 years he has been falling more frequently but reports since Saturday morning- 12/22/16, he is falling much more than usual. He describes trying to stand up and suddenly feeling like his right lower extremity had "gone to sleep", also with weakness in his right upper extremity. 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 Pt now with R hemiplegia UE worse than LE and cognitive deficits. Pt very motivated. Pt to strongly benefit from CIR Upon d/c to achieve supervision level of function to return home safely with wife. Pt with improved R LE function compared to yesterdays OT eval and demo's excellent rehab potential. Acute PT to con't to follow.    Follow Up Recommendations CIR;Supervision/Assistance - 24 hour    Equipment Recommendations   (TBD)    Recommendations for Other Services Rehab consult     Precautions / Restrictions Precautions Precautions: Fall Restrictions Weight Bearing Restrictions: No      Mobility  Bed Mobility Overal bed mobility: Needs Assistance Bed Mobility: Supine to Sit     Supine to sit: Max assist     General bed mobility comments: assist for LEs over EOB, to raise trunk and to square hips up, pt requiring cues for sequencing, increased time  Transfers Overall transfer level: Needs assistance   Transfers: Sit to/from Stand;Stand Pivot Transfers Sit to Stand: Mod assist Stand pivot transfers: Mod assist       General transfer comment: R knee blocked, pt able to push up into standing but with lean to the L, modA to achieve midline and  equal WBing between R and L foot. Pt with R Knee hyperextension. with max directional v/c's and modA pt able to complete stand pvt transfer with modA for R LE placement and advancement and to maintain balance. Pt initiated stepping with R foot, R knee did go into hyperextension with advance ment of L foot.  Ambulation/Gait                Stairs            Wheelchair Mobility    Modified Rankin (Stroke Patients Only) Modified Rankin (Stroke Patients Only) Pre-Morbid Rankin Score: No symptoms Modified Rankin: Moderately severe disability     Balance Overall balance assessment: Needs assistance Sitting-balance support: Feet supported;Single extremity supported Sitting balance-Leahy Scale: Fair Sitting balance - Comments: pt tolerated sitting x 10 min, pt with strong posterior lean with LE LAQ requiring assist to come back to midline   Standing balance support: During functional activity;Single extremity supported Standing balance-Leahy Scale: Poor Standing balance comment: dependent on PT                             Pertinent Vitals/Pain Pain Assessment: No/denies pain    Home Living Family/patient expects to be discharged to:: Inpatient rehab                      Prior Function Level of Independence: Independent         Comments: hx of many falls     Hand Dominance  Dominant Hand: Left    Extremity/Trunk Assessment   Upper Extremity Assessment Upper Extremity Assessment: RUE deficits/detail RUE Deficits / Details: 2/5 shld shrug otherwise no voluntary movement, impaired sensation, low tone    Lower Extremity Assessment Lower Extremity Assessment: RLE deficits/detail RLE Deficits / Details: hip flexion 2/5, knee extension 2/5, ankle 2-/5    Cervical / Trunk Assessment Cervical / Trunk Assessment: Other exceptions Cervical / Trunk Exceptions: h/o ACDF, back sx  Communication   Communication: Expressive difficulties (tangential  speech, word finding deficits)  Cognition Arousal/Alertness: Awake/alert Behavior During Therapy: Impulsive Overall Cognitive Status: Impaired/Different from baseline                     Current Attention Level: Sustained   Following Commands: Follows one step commands consistently (but easily distracted) Safety/Judgement: Decreased awareness of safety;Decreased awareness of deficits   Problem Solving: Slow processing;Difficulty sequencing;Requires verbal cues;Requires tactile cues General Comments: pt with tangential speech, easily distracted. pt even became tearful during eval because "i just like to joke around, i'm not trying to be ugly"      General Comments General comments (skin integrity, edema, etc.): pt with multiple lacerations on L UE and LE and R knee    Exercises General Exercises - Lower Extremity Quad Sets: AROM;Right;10 reps;Supine Long Arc Quad: AROM;Right;10 reps;Seated   Assessment/Plan    PT Assessment Patient needs continued PT services  PT Problem List Decreased strength;Decreased range of motion;Decreased activity tolerance;Decreased balance;Decreased mobility;Decreased coordination;Decreased cognition;Decreased knowledge of use of DME;Decreased safety awareness;Decreased knowledge of precautions;Impaired sensation;Impaired tone       PT Treatment Interventions DME instruction;Gait training;Stair training;Functional mobility training;Therapeutic activities;Therapeutic exercise;Balance training;Neuromuscular re-education;Cognitive remediation;Patient/family education    PT Goals (Current goals can be found in the Care Plan section)  Acute Rehab PT Goals Patient Stated Goal: get back to music PT Goal Formulation: With patient Time For Goal Achievement: 01/08/17 Potential to Achieve Goals: Good    Frequency Min 4X/week   Barriers to discharge Decreased caregiver support wife can not provide physical assist, only supervision    Co-evaluation                AM-PAC PT "6 Clicks" Daily Activity  Outcome Measure Difficulty turning over in bed (including adjusting bedclothes, sheets and blankets)?: Unable Difficulty moving from lying on back to sitting on the side of the bed? : Unable Difficulty sitting down on and standing up from a chair with arms (e.g., wheelchair, bedside commode, etc,.)?: Unable Help needed moving to and from a bed to chair (including a wheelchair)?: A Lot Help needed walking in hospital room?: Total Help needed climbing 3-5 steps with a railing? : Total 6 Click Score: 7    End of Session Equipment Utilized During Treatment: Gait belt Activity Tolerance: Patient tolerated treatment well Patient left: in chair;with call bell/phone within reach;with chair alarm set;with nursing/sitter in room Nurse Communication: Mobility status PT Visit Diagnosis: Difficulty in walking, not elsewhere classified (R26.2);Hemiplegia and hemiparesis Hemiplegia - Right/Left: Right Hemiplegia - dominant/non-dominant: Non-dominant Hemiplegia - caused by: Cerebral infarction    Time: 1009-1045 PT Time Calculation (min) (ACUTE ONLY): 36 min   Charges:   PT Evaluation $PT Eval Moderate Complexity: 1 Mod PT Treatments $Therapeutic Activity: 8-22 mins   PT G Codes:        Kittie Plater, PT, DPT Pager #: [phone] Office #: [phone]   Elizabeth 12/25/2016, 11:18 AM

## 2016-12-25 NOTE — Progress Notes (Signed)
[facility]  CONSULT SERVICE NOTE  Came to see patient who was down in test. Rehab service will follow up in the morning  Meredith Staggers, MD, [facility] 12/25/2016

## 2016-12-26 ENCOUNTER — Inpatient Hospital Stay (HOSPITAL_COMMUNITY): Payer: Medicare Other

## 2016-12-26 ENCOUNTER — Inpatient Hospital Stay (HOSPITAL_COMMUNITY)
Admission: RE | Admit: 2016-12-26 | Discharge: 2017-01-15 | DRG: 057 | Disposition: A | Discharge status: Home Health | Payer: Medicare Other | Source: Intra-hospital | Attending: Physical Medicine & Rehabilitation | Admitting: Physical Medicine & Rehabilitation

## 2016-12-26 ENCOUNTER — Encounter (HOSPITAL_COMMUNITY): Payer: Self-pay | Admitting: Nurse Practitioner

## 2016-12-26 DIAGNOSIS — K219 Gastro-esophageal reflux disease without esophagitis: Secondary | ICD-10-CM | POA: Diagnosis present

## 2016-12-26 DIAGNOSIS — I6300 Cerebral infarction due to thrombosis of unspecified precerebral artery: Secondary | ICD-10-CM

## 2016-12-26 DIAGNOSIS — F329 Major depressive disorder, single episode, unspecified: Secondary | ICD-10-CM | POA: Diagnosis present

## 2016-12-26 DIAGNOSIS — R000 Tachycardia, unspecified: Secondary | ICD-10-CM | POA: Diagnosis present

## 2016-12-26 DIAGNOSIS — I69331 Monoplegia of upper limb following cerebral infarction affecting right dominant side: Secondary | ICD-10-CM

## 2016-12-26 DIAGNOSIS — F17210 Nicotine dependence, cigarettes, uncomplicated: Secondary | ICD-10-CM | POA: Diagnosis present

## 2016-12-26 DIAGNOSIS — Z716 Tobacco abuse counseling: Secondary | ICD-10-CM

## 2016-12-26 DIAGNOSIS — F22 Delusional disorders: Secondary | ICD-10-CM

## 2016-12-26 DIAGNOSIS — D72829 Elevated white blood cell count, unspecified: Secondary | ICD-10-CM

## 2016-12-26 DIAGNOSIS — Z8249 Family history of ischemic heart disease and other diseases of the circulatory system: Secondary | ICD-10-CM

## 2016-12-26 DIAGNOSIS — R296 Repeated falls: Secondary | ICD-10-CM | POA: Diagnosis present

## 2016-12-26 DIAGNOSIS — G8191 Hemiplegia, unspecified affecting right dominant side: Secondary | ICD-10-CM

## 2016-12-26 DIAGNOSIS — E7800 Pure hypercholesterolemia, unspecified: Secondary | ICD-10-CM | POA: Diagnosis present

## 2016-12-26 DIAGNOSIS — I6930 Unspecified sequelae of cerebral infarction: Secondary | ICD-10-CM | POA: Diagnosis not present

## 2016-12-26 DIAGNOSIS — I10 Essential (primary) hypertension: Secondary | ICD-10-CM

## 2016-12-26 DIAGNOSIS — M797 Fibromyalgia: Secondary | ICD-10-CM | POA: Diagnosis present

## 2016-12-26 DIAGNOSIS — Z720 Tobacco use: Secondary | ICD-10-CM

## 2016-12-26 DIAGNOSIS — R4587 Impulsiveness: Secondary | ICD-10-CM | POA: Diagnosis present

## 2016-12-26 DIAGNOSIS — E785 Hyperlipidemia, unspecified: Secondary | ICD-10-CM

## 2016-12-26 DIAGNOSIS — I447 Left bundle-branch block, unspecified: Secondary | ICD-10-CM | POA: Diagnosis present

## 2016-12-26 DIAGNOSIS — K5900 Constipation, unspecified: Secondary | ICD-10-CM | POA: Diagnosis present

## 2016-12-26 DIAGNOSIS — I69351 Hemiplegia and hemiparesis following cerebral infarction affecting right dominant side: Secondary | ICD-10-CM | POA: Diagnosis present

## 2016-12-26 DIAGNOSIS — F4312 Post-traumatic stress disorder, chronic: Secondary | ICD-10-CM | POA: Diagnosis present

## 2016-12-26 DIAGNOSIS — M792 Neuralgia and neuritis, unspecified: Secondary | ICD-10-CM

## 2016-12-26 DIAGNOSIS — F600 Paranoid personality disorder: Secondary | ICD-10-CM | POA: Diagnosis present

## 2016-12-26 DIAGNOSIS — Z23 Encounter for immunization: Secondary | ICD-10-CM | POA: Diagnosis not present

## 2016-12-26 DIAGNOSIS — M6282 Rhabdomyolysis: Secondary | ICD-10-CM | POA: Diagnosis present

## 2016-12-26 DIAGNOSIS — I6330 Cerebral infarction due to thrombosis of unspecified cerebral artery: Secondary | ICD-10-CM | POA: Diagnosis not present

## 2016-12-26 DIAGNOSIS — Z79899 Other long term (current) drug therapy: Secondary | ICD-10-CM | POA: Diagnosis not present

## 2016-12-26 DIAGNOSIS — T796XXS Traumatic ischemia of muscle, sequela: Secondary | ICD-10-CM

## 2016-12-26 DIAGNOSIS — F419 Anxiety disorder, unspecified: Secondary | ICD-10-CM | POA: Diagnosis present

## 2016-12-26 DIAGNOSIS — Z981 Arthrodesis status: Secondary | ICD-10-CM

## 2016-12-26 DIAGNOSIS — F4323 Adjustment disorder with mixed anxiety and depressed mood: Secondary | ICD-10-CM

## 2016-12-26 DIAGNOSIS — G8929 Other chronic pain: Secondary | ICD-10-CM | POA: Diagnosis present

## 2016-12-26 DIAGNOSIS — F4310 Post-traumatic stress disorder, unspecified: Secondary | ICD-10-CM | POA: Diagnosis not present

## 2016-12-26 DIAGNOSIS — Z7982 Long term (current) use of aspirin: Secondary | ICD-10-CM

## 2016-12-26 LAB — BASIC METABOLIC PANEL
Anion gap: 6 (ref 5–15)
BUN: 17 mg/dL (ref 6–20)
CALCIUM: 8.2 mg/dL — AB (ref 8.9–10.3)
CO2: 23 mmol/L (ref 22–32)
Chloride: 108 mmol/L (ref 101–111)
Creatinine, Ser: 1.02 mg/dL (ref 0.61–1.24)
GFR calc Af Amer: >60 mL/min (ref >60)
GLUCOSE: 104 mg/dL — AB (ref 65–99)
POTASSIUM: 4.2 mmol/L (ref 3.5–5.1)
Sodium: 137 mmol/L (ref 135–145)

## 2016-12-26 LAB — VAS US CAROTID
LCCADDIAS: 18 cm/s
LCCADSYS: 122 cm/s
LCCAPDIAS: 16 cm/s
LEFT ECA DIAS: -13 cm/s
LEFT VERTEBRAL DIAS: 10 cm/s
LICADSYS: -131 cm/s
Left CCA prox sys: 146 cm/s
Left ICA dist dias: -21 cm/s
Left ICA prox dias: -28 cm/s
Left ICA prox sys: -141 cm/s
RCCADSYS: -95 cm/s
RCCAPSYS: -143 cm/s
RIGHT ECA DIAS: 0 cm/s
RIGHT VERTEBRAL DIAS: -20 cm/s
Right CCA prox dias: -10 cm/s

## 2016-12-26 LAB — CBC
HCT: 36.7 % — ABNORMAL LOW (ref 39.0–52.0)
Hemoglobin: 12.3 g/dL — ABNORMAL LOW (ref 13.0–17.0)
MCH: 33.8 pg (ref 26.0–34.0)
MCHC: 33.5 g/dL (ref 30.0–36.0)
MCV: 100.8 fL — AB (ref 78.0–100.0)
PLATELETS: 171 10*3/uL (ref 150–400)
RBC: 3.64 MIL/uL — AB (ref 4.22–5.81)
RDW: 13.6 % (ref 11.5–15.5)
WBC: 14.2 10*3/uL — AB (ref 4.0–10.5)

## 2016-12-26 MED ORDER — BISOPROLOL-HYDROCHLOROTHIAZIDE 10-6.25 MG PO TABS
1.0000 | ORAL_TABLET | Freq: Every day | ORAL | Status: DC
  Administered 2016-12-26: 1 via ORAL
  Filled 2016-12-26: qty 1

## 2016-12-26 MED ORDER — GABAPENTIN 300 MG PO CAPS
300.0000 mg | ORAL_CAPSULE | Freq: Three times a day (TID) | ORAL | Status: DC
  Administered 2016-12-26 – 2017-01-15 (×59): 300 mg via ORAL
  Filled 2016-12-26 (×59): qty 1

## 2016-12-26 MED ORDER — AMOXICILLIN-POT CLAVULANATE 875-125 MG PO TABS
1.0000 | ORAL_TABLET | Freq: Two times a day (BID) | ORAL | Status: DC
  Administered 2016-12-26: 1 via ORAL
  Filled 2016-12-26: qty 1

## 2016-12-26 MED ORDER — DIAZEPAM 5 MG PO TABS
10.0000 mg | ORAL_TABLET | Freq: Two times a day (BID) | ORAL | Status: DC | PRN
  Administered 2016-12-26 – 2016-12-27 (×2): 10 mg via ORAL
  Filled 2016-12-26 (×2): qty 2

## 2016-12-26 MED ORDER — ONDANSETRON HCL 4 MG PO TABS
4.0000 mg | ORAL_TABLET | Freq: Four times a day (QID) | ORAL | Status: DC | PRN
  Administered 2017-01-05: 4 mg via ORAL
  Filled 2016-12-26: qty 1

## 2016-12-26 MED ORDER — ATORVASTATIN CALCIUM 40 MG PO TABS
40.0000 mg | ORAL_TABLET | Freq: Every day | ORAL | Status: DC
  Administered 2016-12-26 – 2017-01-14 (×20): 40 mg via ORAL
  Filled 2016-12-26 (×20): qty 1

## 2016-12-26 MED ORDER — FAMOTIDINE 10 MG PO TABS
10.0000 mg | ORAL_TABLET | Freq: Two times a day (BID) | ORAL | Status: DC
  Administered 2016-12-26 – 2017-01-15 (×40): 10 mg via ORAL
  Filled 2016-12-26 (×40): qty 1

## 2016-12-26 MED ORDER — AMOXICILLIN-POT CLAVULANATE 875-125 MG PO TABS: 1.0000 | ORAL_TABLET | Freq: Two times a day (BID) | ORAL | Status: DC

## 2016-12-26 MED ORDER — DIAZEPAM 5 MG PO TABS: 5.0000 mg | ORAL_TABLET | Freq: Every evening | ORAL | Status: DC | PRN

## 2016-12-26 MED ORDER — ESCITALOPRAM OXALATE 10 MG PO TABS
20.0000 mg | ORAL_TABLET | Freq: Every day | ORAL | Status: DC
  Administered 2016-12-27 – 2017-01-15 (×20): 20 mg via ORAL
  Filled 2016-12-26 (×20): qty 2

## 2016-12-26 MED ORDER — CLOPIDOGREL BISULFATE 75 MG PO TABS
75.0000 mg | ORAL_TABLET | Freq: Every day | ORAL | Status: DC
  Administered 2016-12-26: 75 mg via ORAL
  Filled 2016-12-26: qty 1

## 2016-12-26 MED ORDER — BISOPROLOL-HYDROCHLOROTHIAZIDE 10-6.25 MG PO TABS
1.0000 | ORAL_TABLET | Freq: Every day | ORAL | Status: DC
  Administered 2016-12-27 – 2017-01-15 (×20): 1 via ORAL
  Filled 2016-12-26 (×21): qty 1

## 2016-12-26 MED ORDER — PRAZOSIN HCL 2 MG PO CAPS
5.0000 mg | ORAL_CAPSULE | Freq: Every day | ORAL | Status: DC
  Administered 2016-12-26 – 2017-01-14 (×20): 5 mg via ORAL
  Filled 2016-12-26 (×20): qty 1

## 2016-12-26 MED ORDER — DIAZEPAM 10 MG PO TABS: 10.0000 mg | ORAL_TABLET | Freq: Three times a day (TID) | ORAL | Status: DC

## 2016-12-26 MED ORDER — SENNOSIDES-DOCUSATE SODIUM 8.6-50 MG PO TABS: 1.0000 | ORAL_TABLET | Freq: Every evening | ORAL | Status: DC | PRN

## 2016-12-26 MED ORDER — QUETIAPINE FUMARATE 100 MG PO TABS
100.0000 mg | ORAL_TABLET | Freq: Every day | ORAL | Status: DC
  Administered 2016-12-26 – 2017-01-14 (×20): 100 mg via ORAL
  Filled 2016-12-26 (×20): qty 1

## 2016-12-26 MED ORDER — HEPARIN SODIUM (PORCINE) 5000 UNIT/ML IJ SOLN: 5000.0000 [IU] | Freq: Three times a day (TID) | INTRAMUSCULAR | Status: DC

## 2016-12-26 MED ORDER — SORBITOL 70 % SOLN
30.0000 mL | Freq: Every day | Status: DC | PRN
  Administered 2016-12-26 – 2017-01-08 (×2): 30 mL via ORAL
  Filled 2016-12-26 (×3): qty 30

## 2016-12-26 MED ORDER — ATORVASTATIN CALCIUM 40 MG PO TABS: 40.0000 mg | ORAL_TABLET | Freq: Every day | ORAL | Status: DC

## 2016-12-26 MED ORDER — PNEUMOCOCCAL VAC POLYVALENT 25 MCG/0.5ML IJ INJ
0.5000 mL | INJECTION | INTRAMUSCULAR | Status: AC
  Administered 2016-12-28: 0.5 mL via INTRAMUSCULAR
  Filled 2016-12-26 (×2): qty 0.5

## 2016-12-26 MED ORDER — ONDANSETRON HCL 4 MG/2ML IJ SOLN: 4.0000 mg | Freq: Four times a day (QID) | INTRAMUSCULAR | Status: DC | PRN

## 2016-12-26 MED ORDER — BISACODYL 10 MG RE SUPP: 10.0000 mg | Freq: Every day | RECTAL | Status: DC | PRN

## 2016-12-26 MED ORDER — AMOXICILLIN-POT CLAVULANATE 875-125 MG PO TABS
1.0000 | ORAL_TABLET | Freq: Two times a day (BID) | ORAL | Status: DC
  Administered 2016-12-26 – 2017-01-13 (×36): 1 via ORAL
  Filled 2016-12-26 (×36): qty 1

## 2016-12-26 MED ORDER — SENNOSIDES-DOCUSATE SODIUM 8.6-50 MG PO TABS
1.0000 | ORAL_TABLET | Freq: Every day | ORAL | Status: DC
  Administered 2016-12-26 – 2017-01-14 (×20): 1 via ORAL
  Filled 2016-12-26 (×20): qty 1

## 2016-12-26 MED ORDER — HEPARIN SODIUM (PORCINE) 5000 UNIT/ML IJ SOLN
5000.0000 [IU] | Freq: Three times a day (TID) | INTRAMUSCULAR | Status: DC
  Administered 2016-12-26 – 2017-01-15 (×59): 5000 [IU] via SUBCUTANEOUS
  Filled 2016-12-26 (×60): qty 1

## 2016-12-26 MED ORDER — CLOPIDOGREL BISULFATE 75 MG PO TABS: 75.0000 mg | ORAL_TABLET | Freq: Every day | ORAL | Status: DC

## 2016-12-26 NOTE — Progress Notes (Signed)
PROGRESS NOTE    Perry Cervantes  WGN:[id number] DOB: [birth year] DOA: 12/23/2016 PCP: Monico Blitz, MD   Outpatient Specialists:     Brief Narrative:   Perry Cervantes is a 66 y.o. male with medical history significant for chronic back pain, depression, hypertension, PTSD. Patient presented with complaints of multiple falls. Over the past 2 years he has been falling more frequently but reports since Saturday morning- 12/22/16, he is falling much more than usual. He describes trying to stand up and suddenly feeling like his right lower extremity had "gone to sleep", also with weakness in his right upper extremity. Since Saturday morning he has fallen at least 3 times. Patient denies vision change, no slurred, no difficulty swallowing, no facial asymmetry, no bowel or urinary problems.   Patient was in the ED 12/22/16, with complaints of fall hurting his right elbow and left knee. Right elbow x-rays and left knee x-rays were unremarkable, as patient was discharged home to follow-up with PCP., But patient presented again because he fell while trying to get out of the car when he got home. He was on the floor for several hours, as he felt he would be able to get up at some point of he kept trying, his wife could not help him up and subsequently patient presented to the ED again after calling 911.    Assessment & Plan:   Principal Problem:   Right sided weakness Active Problems:   PTSD (post-traumatic stress disorder)   Depression   Chronic back pain   AKI (acute kidney injury) ([place])   Rhabdomyolysis   CVA (cerebral vascular accident) ([place])   CVA -MRI + for 2 cm acute infarct in the left corona radiata -LDL: 107-- on statin-- after acute rhabdo episode over, may need to increase HgbA1c: 4.9 Echo: - Left ventricle: The cavity size was normal. Systolic function was   normal. The estimated ejection fraction was in the range of 60%   to 65%. Wall motion was normal; there were no  regional wall   motion abnormalities. Left ventricular diastolic function   parameters were normal. - Mitral valve: Valve area by pressure half-time: 1.38 cm^2. Carotids:<1-39% -neuro consult appreciated -PT/OT- SNF vs CIR -ASA -SLP eval   New leukocytosis with possible PNA -augmentin PO -SLP eval -incentive spirometry  New LBBB- prob rate dependant -troponin negative -echo unremarkable -cardiology consulted  AKI -improved with IVF  Rhabdomyolysis -IVF, CK trending down  PTSD -on seroquel-- monitor Qtc  HTN -resume home meds   DVT prophylaxis:  Lovenox   Code Status: Full Code   Family Communication: Called daughter  Disposition Plan:     Consultants:   Cards  neuro  Procedures:      Subjective: Has overnight increasing HR No pain No SOB  Objective: Vitals:   12/25/16 2015 12/25/16 2107 12/25/16 2237 12/26/16 0558  BP: 126/61 125/65 131/66 (!) 152/70  Pulse: (!) 121 (!) 119 (!) 117 (!) 101  Resp:  18  18  Temp:  99 F (37.2 C)  98.4 F (36.9 C)  TempSrc:  Oral  Oral  SpO2:  92%  95%  Weight:      Height:        Intake/Output Summary (Last 24 hours) at 12/26/16 1022 Last data filed at 12/25/16 1348  Gross per 24 hour  Intake                0 ml  Output  450 ml  Net             -450 ml   Filed Weights   12/23/16 1348  Weight: 79.4 kg (175 lb)    Examination:  General exam: in bed- leaning to left Respiratory system: diminished Cardiovascular system: rrr Gastrointestinal system: +BS, soft Central nervous system: A+Ox3, NAD Extremities: weakness in right arm/leg-- sensation intact     Data Reviewed: I have personally reviewed following labs and imaging studies  CBC:  Recent Labs Lab 12/23/16 1530 12/25/16 0539 12/26/16 0527  WBC 16.2* 8.3 14.2*  NEUTROABS 11.9*  --   --   HGB 15.6 12.6* 12.3*  HCT 44.3 36.7* 36.7*  MCV 98.4 100.3* 100.8*  PLT 249 191 829   Basic Metabolic Panel:  Recent  Labs Lab 12/23/16 1418 12/24/16 0948 12/25/16 0539 12/26/16 0527  NA 136 136 139 137  K 4.7 3.9 4.0 4.2  CL 103 106 111 108  CO2 19* 20* 22 23  GLUCOSE 93 149* 105* 104*  BUN 39* 37* 22* 17  CREATININE 2.16* 1.68* 1.04 1.02  CALCIUM 8.8* 8.1* 8.4* 8.2*   GFR: Estimated Creatinine Clearance: 73.2 mL/min (by C-G formula based on SCr of 1.02 mg/dL). Liver Function Tests:  Recent Labs Lab 12/23/16 1418 12/24/16 0948 12/25/16 0539  AST 198* 79* 60*  ALT 58 38 37  ALKPHOS 70 53 53  BILITOT 1.5* 0.5 0.6  PROT 6.5 5.1* 5.1*  ALBUMIN 4.1 3.1* 3.1*   No results for input(s): LIPASE, AMYLASE in the last 168 hours. No results for input(s): AMMONIA in the last 168 hours. Coagulation Profile: No results for input(s): INR, PROTIME in the last 168 hours. Cardiac Enzymes:  Recent Labs Lab 12/23/16 1959 12/24/16 0948 12/24/16 1244 12/25/16 0539  CKTOTAL 11,003* 4,444*  --  2,734*  TROPONINI  --   --  <0.03  --    BNP (last 3 results) No results for input(s): PROBNP in the last 8760 hours. HbA1C:  Recent Labs  12/24/16 0010  HGBA1C 4.9   CBG: No results for input(s): GLUCAP in the last 168 hours. Lipid Profile:  Recent Labs  12/24/16 0010  CHOL 174  HDL 34*  LDLCALC 107*  TRIG 167*  CHOLHDL 5.1   Thyroid Function Tests: No results for input(s): TSH, T4TOTAL, FREET4, T3FREE, THYROIDAB in the last 72 hours. Anemia Panel: No results for input(s): VITAMINB12, FOLATE, FERRITIN, TIBC, IRON, RETICCTPCT in the last 72 hours. Urine analysis:    Component Value Date/Time   COLORURINE YELLOW 05/26/2009 [place] 05/26/2009 1446   LABSPEC 1.025 05/26/2009 1446   PHURINE 5.0 05/26/2009 1446   GLUCOSEU NEGATIVE 05/26/2009 1446   HGBUR LARGE (A) 05/26/2009 1446   BILIRUBINUR NEGATIVE 05/26/2009 1446   KETONESUR NEGATIVE 05/26/2009 1446   PROTEINUR NEGATIVE 05/26/2009 1446   UROBILINOGEN 0.2 05/26/2009 1446   NITRITE NEGATIVE 05/26/2009 1446    LEUKOCYTESUR TRACE (A) 05/26/2009 1446     )No results found for this or any previous visit (from the past 240 hour(s)).    Anti-infectives    Start     Dose/Rate Route Frequency Ordered Stop   12/26/16 1000  amoxicillin-clavulanate (AUGMENTIN) 875-125 MG per tablet 1 tablet     1 tablet Oral Every 12 hours 12/26/16 0844         Radiology Studies: Mr Jodene Nam Head Wo Contrast  Result Date: 12/26/2016 CLINICAL DATA:  Stroke follow-up. EXAM: MRA HEAD WITHOUT CONTRAST TECHNIQUE: Angiographic images of the Circle of  [place] were obtained using MRA technique without intravenous contrast. COMPARISON:  Brain MRI from yesterday FINDINGS: Symmetric carotid arteries. Dominant right vertebral artery. Intact circle-of-Willis. No flow seen within the left vertebral artery before the patent PICA, likely occlusion with retrograde flow via the basilar. Retrograde flow in the non-opacified segment is considered unlikely given the robust signal in the distal V4 segment and PICA. The other vessels are smooth and widely patent. There is a small inferior bulge from the mid cavernous left ICA, suspect an infundibulum due to shape and location, ~1.5 mm. No evidence of intradural aneurysm. IMPRESSION: 1. No detected flow in the non dominant proximal left V4 segment with presumed retrograde filling of the patent left PICA. Widely patent left vertebral and basilar arteries. 2. No anterior circulation stenosis or branch occlusion noted. 3. 1-2 mm left mid cavernous ICA bulge, favor infundibulum. No evidence of intradural aneurysm. Electronically Signed   By: Monte Fantasia M.D.   On: 12/26/2016 10:08   Mr Brain Wo Contrast  Result Date: 12/25/2016 CLINICAL DATA:  Stroke follow-up. Right lower extremity goes to sleep. Weakness and right upper extremity. EXAM: MRI HEAD WITHOUT CONTRAST TECHNIQUE: Multiplanar, multiecho pulse sequences of the brain and surrounding structures were obtained without intravenous contrast. COMPARISON:   Head CT from 2 days ago FINDINGS: Brain: 2 cm long area of wedge-shaped restricted diffusion crossing the left corona radiata. No acute hemorrhage, hydrocephalus, or masslike findings. Small remote left cerebellar infarct. Mild scattered FLAIR hyperintensities in the cerebral white matter, usually from chronic small vessel ischemia, although nonspecific. No chronic hemorrhagic foci. Vascular: Absent flow void in the non dominant proximal left V4 segment which appears normalized after the pica, possible retrograde flow. Skull and upper cervical spine: Negative for marrow lesion Sinuses/Orbits: Negative IMPRESSION: 1. ~2 cm acute infarct in the left corona radiata. 2. Mild cerebral white matter disease, nonspecific but often from chronic small vessel ischemia. 3. Slow or absent flow in the proximal left V4 segment with normalized appearance at the vertebrobasilar junction. Electronically Signed   By: Monte Fantasia M.D.   On: 12/25/2016 09:00   Dg Chest Port 1 View  Result Date: 12/26/2016 CLINICAL DATA:  66 year old male with a history of cough and hypertension EXAM: PORTABLE CHEST 1 VIEW COMPARISON:  05/07/2013, 04/26/2006 FINDINGS: Cardiomediastinal silhouette unchanged in size and contour. No evidence of central vascular congestion. Lung volumes are low with crowding of the interstitium. Linear opacity in the left infrahilar region. No large pleural effusion. No pneumothorax. Surgical changes of ACDF, incompletely imaged. New eggshell calcification in the left axillary region. IMPRESSION: Low lung volumes with new linear opacity in the left infrahilar region, potentially representing atelectasis though difficult to exclude atypical infection. New eggshell calcifications in the left axillary soft tissues, potentially related to lymph nodes. If there is concern for lymphoproliferative disorder, correlation with lab values may be useful. Electronically Signed   By: Corrie Mckusick D.O.   On: 12/26/2016 08:32         Scheduled Meds: .  stroke: mapping our early stages of recovery book   Does not apply Once  . amoxicillin-clavulanate  1 tablet Oral Q12H  . aspirin  325 mg Oral Daily  . atorvastatin  40 mg Oral q1800  . bisoprolol-hydrochlorothiazide  1 tablet Oral Daily  . escitalopram  20 mg Oral Daily  . famotidine  10 mg Oral BID  . gabapentin  300 mg Oral TID  . heparin  5,000 Units Subcutaneous Q8H  . prazosin  5 mg Oral QHS  . QUEtiapine  100 mg Oral QHS   Continuous Infusions:    LOS: 2 days    Time spent: 25 min    [place], DO [facility] Pager [phone]  If 7PM-7AM, please contact night-coverage [email or web address] Password [email or web address] 12/26/2016, 10:22 AM

## 2016-12-26 NOTE — PMR Pre-admission (Signed)
PMR Admission Coordinator Pre-Admission Assessment  Patient: Perry Cervantes is an 66 y.o., male MRN: [id number] DOB: Dec 12, 1950 Height: 5\' 9"  (175.3 cm) Weight: 79.4 kg (175 lb)             Insurance Information HMO: No   PPO:       PCP:       IPA:       80/20:       OTHER:   PRIMARY:  Medicare Cervantes/B      Policy#: [id number] Cervantes      Subscriber: Perry Cervantes CM Name:        Phone#:       Fax#:   Pre-Cert#:        Employer: Disabled Benefits:  Phone #:       Name: Checked in Passport one source Perry Cervantes. Date: Cervantes=01/22/07 and B=01-22-08     Deduct: $1340      Out of Pocket Max: none      Life Max: N/Cervantes CIR: 100%      SNF: 100 days Outpatient: 80%     Co-Pay: 20% Home Health: 100%      Co-Pay: none DME: 80%     Co-Pay: 20% Providers: patient's choice  Emergency Contact Information Contact Information    Name Relation Home Work Perry Cervantes [phone]  [phone]     Current Medical History  Patient Admitting Diagnosis:  Left CR infarct  History of Present Illness: Cervantes 67 y.o. right handed male with history of chronic back pain, hypertension, PTSD and tobacco abuse. Per chart review patient lives with wife who is retired. Patient uses occasional cane prior to admission. 2 level with bedroom on main level. Presented 12/23/2016 with multiple falls and right-sided weakness. Cranial CT scan negative for acute changes. CT cervical spine showed moderate central disc protrusion at C4-5. C5-C7 anterior fusion. MRI of the brain showed Cervantes 2 cm acute infarction in the left corona radiata. Echocardiogram with ejection fraction of 65% no wall motion abnormalities. Patient did not receive TPA. Carotid Dopplers had no ICA stenosis. White blood cell count elevated 16,200 with CK elevated 11,003. Troponin negative. MRA of the brain is pending. Urine drug screen pending. Neurology consulted placed on aspirin for CVA prophylaxis. Subcutaneous heparin for DVT prophylaxis. Physical therapy  evaluation completed 12/25/2016 with recommendations of physical medicine rehabilitation consult. Note:  Patient focused on his PTSD, states that his medications were off schedule. Last night. Upset that he had to go to an MRI scan and let his breakfast to get cold.   Total: 7=NIH  Past Medical History  Past Medical History:  Diagnosis Date  . Anxiety   . Chronic back pain    Spondylolisthesis, spondylosis, scoliosis, and radiculopathy  . Fibromyalgia   . Gastroesophageal reflux disease   . History of colon polyps   . History of kidney stones   . Hypertension   . Mild depression ([place])   . PTSD (post-traumatic stress disorder)   . Spondylolysis of lumbar region     Family History  family history includes Heart attack in his mother; Other in his father.  Prior Rehab/Hospitalizations: Has Cervantes back fusion done Cervantes couple years ago.  Has the patient had major surgery during 100 days prior to admission? No  Current Medications   Current Facility-Administered Medications:  .   stroke: mapping our early stages of recovery book, , Does not apply, Once, Perry Cervantes U, DO .  amoxicillin-clavulanate (AUGMENTIN) 875-125 MG per tablet  1 tablet, 1 tablet, Oral, Q12H, Perry Cervantes, Perry U, DO, 1 tablet at 12/26/16 1025 .  atorvastatin (LIPITOR) tablet 40 mg, 40 mg, Oral, q1800, Perry Cervantes, Perry E, MD, 40 mg at 12/25/16 1733 .  bisoprolol-hydrochlorothiazide (ZIAC) 10-6.25 MG per tablet 1 tablet, 1 tablet, Oral, Daily, Perry Cervantes, Perry U, DO, 1 tablet at 12/26/16 1025 .  clopidogrel (PLAVIX) tablet 75 mg, 75 mg, Oral, Daily, Perry Cervantes, Perry A, NP .  diazepam (VALIUM) tablet 10 mg, 10 mg, Oral, Q12H PRN, Perry Cervantes, Perry U, DO, 10 mg at 12/26/16 1050 .  diazepam (VALIUM) tablet 5 mg, 5 mg, Oral, QHS PRN, Perry Cervantes, Perry U, DO, 5 mg at 12/25/16 2115 .  escitalopram (LEXAPRO) tablet 20 mg, 20 mg, Oral, Daily, Perry Cervantes, Perry E, MD, 20 mg at 12/26/16 1025 .  famotidine (PEPCID) tablet 10 mg, 10 mg, Oral,  BID, Perry Cervantes, Perry E, MD, 10 mg at 12/26/16 1025 .  gabapentin (NEURONTIN) capsule 300 mg, 300 mg, Oral, TID, Perry Cervantes, Perry E, MD, 300 mg at 12/26/16 1025 .  heparin injection 5,000 Units, 5,000 Units, Subcutaneous, Q8H, Perry Cervantes, Perry E, MD, 5,000 Units at 12/26/16 1311 .  HYDROcodone-acetaminophen (NORCO/VICODIN) 5-325 MG per tablet 1-2 tablet, 1-2 tablet, Oral, Q4H PRN, Perry Cervantes, Perry E, MD, 2 tablet at 12/25/16 2008 .  labetalol (NORMODYNE,TRANDATE) injection 10 mg, 10 mg, Intravenous, Q4H PRN, Perry Cervantes, Perry E, MD .  prazosin (MINIPRESS) capsule 5 mg, 5 mg, Oral, QHS, Perry Cervantes, Perry E, MD, 5 mg at 12/25/16 2116 .  QUEtiapine (SEROQUEL) tablet 100 mg, 100 mg, Oral, QHS, Perry Cervantes, Perry E, MD, 100 mg at 12/25/16 2114 .  senna-docusate (Senokot-S) tablet 1 tablet, 1 tablet, Oral, QHS PRN, Perry Cervantes, Perry E, MD  Patients Current Diet: Diet Heart Room service appropriate? Yes; Fluid consistency: Thin Diet - low sodium heart healthy  Precautions / Restrictions Precautions Precautions: Fall Restrictions Weight Bearing Restrictions: No   Has the patient had 2 or more falls or Cervantes fall with injury in the past year?Yes Patient reports 6-7 falls with most recent fall resulting in injury.  Prior Activity Level Community (5-7x/wk): Went out 3-4 times Cervantes week, was driving.  Home Assistive Devices / Equipment Home Assistive Devices/Equipment: None Home Equipment: Bedside commode, Walker - 2 wheels, Cane - single point, Hand held shower head  Prior Device Use: Indicate devices/aids used by the patient prior to current illness, exacerbation or injury? Cane  Prior Functional Level Prior Function Level of Independence: Independent Comments: hx of many falls  Self Care: Did the patient need help bathing, dressing, using the toilet or eating?  Independent  Indoor Mobility: Did the patient need assistance with walking from room to room (with or without  device)? Independent  Stairs: Did the patient need assistance with internal or external stairs (with or without device)? Independent  Functional Cognition: Did the patient need help planning regular tasks such as shopping or remembering to take medications? Independent  Current Functional Level Cognition  Arousal/Alertness: Awake/alert Overall Cognitive Status: Impaired/Different from baseline Current Attention Level: Sustained Orientation Level: Oriented X4 Following Commands: Follows one step commands consistently (but easily distracted) Safety/Judgement: Decreased awareness of safety, Decreased awareness of deficits General Comments: patient with poor attention, restlessness, imulsivity and poor ability to sustain task performance. Very easily distracted and tangential during session Attention: Sustained Sustained Attention: Impaired Sustained Attention Impairment: Verbal basic Memory: Impaired Memory Impairment: Storage deficit, Retrieval deficit Awareness: Impaired Awareness Impairment: Intellectual impairment, Emergent impairment, Anticipatory impairment Problem Solving: Appears intact (for basic) Behaviors:  (anxious) Safety/Judgment:  Impaired    Extremity Assessment (includes Sensation/Coordination)  Upper Extremity Assessment: RUE deficits/detail RUE Deficits / Details: 2/5 shld shrug otherwise no voluntary movement, impaired sensation, low tone RUE Coordination: decreased fine motor, decreased gross motor  Lower Extremity Assessment: RLE deficits/detail RLE Deficits / Details: hip flexion 2/5, knee extension 2/5, ankle 2-/5    ADLs  Overall ADL's : Needs assistance/impaired Eating/Feeding: Set up, Sitting Grooming: Wash/dry hands, Wash/dry face, Moderate assistance, Sitting Upper Body Bathing: Maximal assistance, Sitting Lower Body Bathing: Total assistance, Sitting/lateral leans Lower Body Bathing Details (indicate cue type and reason): socks Upper Body Dressing :  Moderate assistance, Sitting Lower Body Dressing: Total assistance, Sitting/lateral leans Toilet Transfer: Moderate assistance, Squat-pivot, BSC Toileting- Clothing Manipulation and Hygiene: Total assistance, Sitting/lateral Cervantes    Mobility  Overal bed mobility: Needs Assistance Bed Mobility: Supine to Sit Supine to sit: Mod assist General bed mobility comments: Patient able to bring LEs to EOB, moderate assist to elevate trunk to upright     Transfers  Overall transfer level: Needs assistance Transfers: Sit to/from Stand, Stand Pivot Transfers Sit to Stand: Mod assist Stand pivot transfers: Mod assist, +2 physical assistance Squat pivot transfers: Mod assist General transfer comment: Patient required knee blocking RLE and facilitation of weight shifts to advance RLE during pivotal movements to chair    Ambulation / Gait / Stairs / Wheelchair Mobility       Posture / Balance Dynamic Sitting Balance Sitting balance - Comments: pt tolerated sitting x 10 min, pt with strong posterior Cervantes with LE LAQ requiring assist to come back to midline Balance Overall balance assessment: Needs assistance Sitting-balance support: Feet supported, Single extremity supported Sitting balance-Leahy Scale: Fair Sitting balance - Comments: pt tolerated sitting x 10 min, pt with strong posterior Cervantes with LE LAQ requiring assist to come back to midline Postural control: Posterior Cervantes, Left lateral Cervantes Standing balance support: During functional activity, Single extremity supported Standing balance-Leahy Scale: Poor Standing balance comment: Significant reliance on therapist assist    Special needs/care consideration BiPAP/CPAP No CPM No Continuous Drip IV No Dialysis No     Life Vest No Oxygen No Special Bed No Trach Size No Wound Vac (area) No     Skin Has dressing on both elbows where he fell and injured his arms.                           Bowel mgmt: Last BM 8-31/18 Bladder mgmt: External  catheter Diabetic mgmt No    Previous Home Environment Living Arrangements: Spouse/significant other  Lives With: Spouse Available Help at Discharge: Family, Available 24 hours/day (wife cannot physically assist) Type of Home: House Home Layout: Two level, Able to live on main level with bedroom/bathroom Home Access: Stairs to enter Entrance Stairs-Rails: None Entrance Stairs-Number of Steps: 3 Bathroom Shower/Tub: Chiropodist: Standard Home Care Services: No  Discharge Living Setting Plans for Discharge Living Setting: House, Lives with (comment) (Lives with wife.) Type of Home at Discharge: House Discharge Home Layout: Two level, Laundry or work area in basement, Able to live on main level with bedroom/bathroom Alternate Level Stairs-Number of Steps: 17 steps down to basement and also 17 steps up to apartment where grandson lives. Discharge Home Access: Stairs to enter Entrance Stairs-Number of Steps: 4 steps at front entry Does the patient have any problems obtaining your medications?: No  Social/Family/Support Systems Patient Roles: Spouse (Has Cervantes wife.) Contact Information: Drake Leach -  wife Anticipated Caregiver: wife Anticipated Caregiver's Contact Information:  Horris Latino - [phone] Ability/Limitations of Caregiver: Not working, is disabled and then retired, can provide supervision Caregiver Availability: 24/7 Discharge Plan Discussed with Primary Caregiver: Yes Is Caregiver In Agreement with Plan?: Yes Does Caregiver/Family have Issues with Lodging/Transportation while Pt is in Rehab?: No  Goals/Additional Needs Patient/Family Goal for Rehab: PT/OT supervision goals Expected length of stay: 12-16 days Cultural Considerations: None Dietary Needs: Heart diet, thin liquids Equipment Needs: TBD Pt/Family Agrees to Admission and willing to participate: Yes Program Orientation Provided & Reviewed with Pt/Caregiver Including Roles  &  Responsibilities: Yes  Decrease burden of Care through IP rehab admission: N/Cervantes  Possible need for SNF placement upon discharge: Not anticipated  Patient Condition: This patient's condition remains as documented in the consult dated 12/26/16, in which the Rehabilitation Physician determined and documented that the patient's condition is appropriate for intensive rehabilitative care in an inpatient rehabilitation facility. Will admit to inpatient rehab today.  Preadmission Screen Completed By:  [name], 12/26/2016 2:11 PM ______________________________________________________________________   Discussed status with Dr. Posey Pronto on 12/26/16 at 1410 and received telephone approval for admission today.  Admission Coordinator:  [name], time 1410/Date 12/26/16

## 2016-12-26 NOTE — Discharge Summary (Signed)
Physician Discharge Summary  Perry Cervantes XNT:[id number] DOB: Mar 07, 1951 DOA: 12/23/2016  PCP: Monico Blitz, MD  Admit date: 12/23/2016 Discharge date: 12/26/2016   Recommendations for Outpatient Follow-Up:   1. To CIR 2. abx started for possible aspiration PNA on 9/5-- please monitor for response-- would treat for 5-7 days  Discharge Diagnosis:   Principal Problem:   Right sided weakness Active Problems:   PTSD (post-traumatic stress disorder)   Depression   Chronic back pain   AKI (acute kidney injury) ([place])   Rhabdomyolysis   CVA (cerebral vascular accident) [facility])   Discharge disposition:  CIR  Discharge Condition: Improved.  Diet recommendation: Low sodium, heart healthy  Wound care: None.   History of Present Illness:   Perry Cervantes is a 66 y.o. male with medical history significant for chronic back pain, depression, hypertension, PTSD. Patient presented with complaints of multiple falls. Over the past 2 years he has been falling more frequently but reports since Saturday morning- 12/22/16, he is falling much more than usual. He describes trying to stand up and suddenly feeling like his right lower extremity had "gone to sleep", also with weakness in his right upper extremity. Since Saturday morning he has fallen at least 3 times. Patient denies vision change, no slurred, no difficulty swallowing, no facial asymmetry, no bowel or urinary problems.   Patient was in the ED 12/22/16, with complaints of fall hurting his right elbow and left knee. Right elbow x-rays and left knee x-rays were unremarkable, as patient was discharged home to follow-up with PCP., But patient presented again because he fell while trying to get out of the car when he got home. He was on the floor for several hours, as he felt he would be able to get up at some point of he kept trying, his wife could not help him up and subsequently patient presented to the ED again after calling 911.    Patient denied fever or chills, endorses occasional alcohol intake, no shortness of breath or cough, no dysuria or frequency, no diarrhea or vomiting, no abdominal pain, no headaches, has chronic unchanged neck pain and back pain.   Hospital Course by Problem:   CVA -MRI + for 2 cm acute infarct in the left corona radiata -LDL: 107-- on statin-- after acute rhabdo episode over, may need to increase HgbA1c: 4.9 Echo: - Left ventricle: The cavity size was normal. Systolic function was normal. The estimated ejection fraction was in the range of 60% to 65%. Wall motion was normal; there were no regional wall motion abnormalities. Left ventricular diastolic function parameters were normal. - Mitral valve: Valve area by pressure half-time: 1.38 cm^2. Carotids:<1-39% -neuro consult appreciated-- please follow up on final recommendations from Dr. Leonie Man- not finished at time of this note -PT/OT- CIR -plavix -SLP eval   New leukocytosis with possible PNA -augmentin PO -SLP eval -incentive spirometry  New LBBB- prob rate dependant -troponin negative -echo unremarkable -cardiology has seen  AKI -improved with IVF  Rhabdomyolysis -IVF, CK trending down  PTSD -on seroquel-- monitor Qtc  HTN -resume home meds    Medical Consultants:   Neuro Cards rehab  Discharge Exam:   Vitals:   12/25/16 2237 12/26/16 0558  BP: 131/66 (!) 152/70  Pulse: (!) 117 (!) 101  Resp:  18  Temp:  98.4 F (36.9 C)  SpO2:  95%   Vitals:   12/25/16 2015 12/25/16 2107 12/25/16 2237 12/26/16 0558  BP: 126/61 125/65 131/66 Marland Kitchen)  152/70  Pulse: (!) 121 (!) 119 (!) 117 (!) 101  Resp:  18  18  Temp:  99 F (37.2 C)  98.4 F (36.9 C)  TempSrc:  Oral  Oral  SpO2:  92%  95%  Weight:      Height:            The results of significant diagnostics from this hospitalization (including imaging, microbiology, ancillary and laboratory) are listed below for reference.      Procedures and Diagnostic Studies:   Dg Ribs Bilateral W/chest  Result Date: 12/23/2016 CLINICAL DATA:  Fall last night.  Pain and weakness EXAM: BILATERAL RIBS AND CHEST - 4+ VIEW COMPARISON:  None. FINDINGS: Normal cardiac silhouette. Cervical fusion. No pneumothorax or pulmonary contusion. Dedicated views of the ribs demonstrate no displaced fracture. IMPRESSION: No rib fracture or thoracic trauma. Electronically Signed   By: Suzy Bouchard M.D.   On: 12/23/2016 20:23   Ct Head Wo Contrast  Result Date: 12/23/2016 CLINICAL DATA:  66 year old male with fall yesterday with headache. Ataxia. History of cervical fusion. EXAM: CT HEAD WITHOUT CONTRAST CT CERVICAL SPINE WITHOUT CONTRAST TECHNIQUE: Multidetector CT imaging of the head and cervical spine was performed following the standard protocol without intravenous contrast. Multiplanar CT image reconstructions of the cervical spine were also generated. COMPARISON:  04/26/2006 cervical spine radiographs and 04/17/2006 cervical spine MR. FINDINGS: CT HEAD FINDINGS Brain: No evidence of acute infarction, hemorrhage, hydrocephalus, extra-axial collection or mass lesion/mass effect. Mild atrophy and chronic small-vessel white matter ischemic changes are noted. Vascular: Intracranial atherosclerotic calcifications noted. Skull: Normal. Negative for fracture or focal lesion. Sinuses/Orbits: No acute finding. Other: None CT CERVICAL SPINE FINDINGS Alignment: Normal. Skull base and vertebrae: No acute fracture. No primary bone lesion or focal pathologic process. Soft tissues and spinal canal: No prevertebral fluid or swelling. No visible canal hematoma. Disc levels: Anterior fusion from C5-C7 noted. Mild to moderate degenerative disc disease/ spondylosis at C3-4 and mild multilevel facet arthropathy identified contributing to mild central spinal and bony foraminal narrowing at several levels. A moderate central disc protrusion at C4-5 noted. Upper chest: No  acute abnormality Other: None IMPRESSION: 1. No evidence of acute intracranial abnormality. 2. No static evidence of acute injury to the cervical spine. 3. Moderate central disc protrusion at C4-5. 4. Mild cerebral atrophy and chronic small-vessel white matter ischemic changes. 5. C5-C7 anterior fusion.  Multilevel degenerative changes. Electronically Signed   By: Margarette Canada M.D.   On: 12/23/2016 16:01   Ct Cervical Spine Wo Contrast  Result Date: 12/23/2016 CLINICAL DATA:  66 year old male with fall yesterday with headache. Ataxia. History of cervical fusion. EXAM: CT HEAD WITHOUT CONTRAST CT CERVICAL SPINE WITHOUT CONTRAST TECHNIQUE: Multidetector CT imaging of the head and cervical spine was performed following the standard protocol without intravenous contrast. Multiplanar CT image reconstructions of the cervical spine were also generated. COMPARISON:  04/26/2006 cervical spine radiographs and 04/17/2006 cervical spine MR. FINDINGS: CT HEAD FINDINGS Brain: No evidence of acute infarction, hemorrhage, hydrocephalus, extra-axial collection or mass lesion/mass effect. Mild atrophy and chronic small-vessel white matter ischemic changes are noted. Vascular: Intracranial atherosclerotic calcifications noted. Skull: Normal. Negative for fracture or focal lesion. Sinuses/Orbits: No acute finding. Other: None CT CERVICAL SPINE FINDINGS Alignment: Normal. Skull base and vertebrae: No acute fracture. No primary bone lesion or focal pathologic process. Soft tissues and spinal canal: No prevertebral fluid or swelling. No visible canal hematoma. Disc levels: Anterior fusion from C5-C7 noted. Mild to moderate degenerative disc  disease/ spondylosis at C3-4 and mild multilevel facet arthropathy identified contributing to mild central spinal and bony foraminal narrowing at several levels. A moderate central disc protrusion at C4-5 noted. Upper chest: No acute abnormality Other: None IMPRESSION: 1. No evidence of acute  intracranial abnormality. 2. No static evidence of acute injury to the cervical spine. 3. Moderate central disc protrusion at C4-5. 4. Mild cerebral atrophy and chronic small-vessel white matter ischemic changes. 5. C5-C7 anterior fusion.  Multilevel degenerative changes. Electronically Signed   By: Margarette Canada M.D.   On: 12/23/2016 16:01     Labs:   Basic Metabolic Panel:  Recent Labs Lab 12/23/16 1418 12/24/16 0948 12/25/16 0539 12/26/16 0527  NA 136 136 139 137  K 4.7 3.9 4.0 4.2  CL 103 106 111 108  CO2 19* 20* 22 23  GLUCOSE 93 149* 105* 104*  BUN 39* 37* 22* 17  CREATININE 2.16* 1.68* 1.04 1.02  CALCIUM 8.8* 8.1* 8.4* 8.2*   GFR Estimated Creatinine Clearance: 73.2 mL/min (by C-G formula based on SCr of 1.02 mg/dL). Liver Function Tests:  Recent Labs Lab 12/23/16 1418 12/24/16 0948 12/25/16 0539  AST 198* 79* 60*  ALT 58 38 37  ALKPHOS 70 53 53  BILITOT 1.5* 0.5 0.6  PROT 6.5 5.1* 5.1*  ALBUMIN 4.1 3.1* 3.1*   No results for input(s): LIPASE, AMYLASE in the last 168 hours. No results for input(s): AMMONIA in the last 168 hours. Coagulation profile No results for input(s): INR, PROTIME in the last 168 hours.  CBC:  Recent Labs Lab 12/23/16 1530 12/25/16 0539 12/26/16 0527  WBC 16.2* 8.3 14.2*  NEUTROABS 11.9*  --   --   HGB 15.6 12.6* 12.3*  HCT 44.3 36.7* 36.7*  MCV 98.4 100.3* 100.8*  PLT 249 191 171   Cardiac Enzymes:  Recent Labs Lab 12/23/16 1959 12/24/16 0948 12/24/16 1244 12/25/16 0539  CKTOTAL 11,003* 4,444*  --  2,734*  TROPONINI  --   --  <0.03  --    BNP: Invalid input(s): POCBNP CBG: No results for input(s): GLUCAP in the last 168 hours. D-Dimer No results for input(s): DDIMER in the last 72 hours. Hgb A1c  Recent Labs  12/24/16 0010  HGBA1C 4.9   Lipid Profile  Recent Labs  12/24/16 0010  CHOL 174  HDL 34*  LDLCALC 107*  TRIG 167*  CHOLHDL 5.1   Thyroid function studies No results for input(s): TSH,  T4TOTAL, T3FREE, THYROIDAB in the last 72 hours.  Invalid input(s): FREET3 Anemia work up No results for input(s): VITAMINB12, FOLATE, FERRITIN, TIBC, IRON, RETICCTPCT in the last 72 hours. Microbiology No results found for this or any previous visit (from the past 240 hour(s)).   Discharge Instructions:   Discharge Instructions    Diet - low sodium heart healthy    Complete by:  As directed    Increase activity slowly    Complete by:  As directed      Allergies as of 12/26/2016   No Known Allergies     Medication List    STOP taking these medications   aspirin 81 MG tablet   ibuprofen 800 MG tablet Commonly known as:  ADVIL,MOTRIN   lisinopril 5 MG tablet Commonly known as:  PRINIVIL,ZESTRIL     TAKE these medications   amLODipine 10 MG tablet Commonly known as:  NORVASC Take 10 mg by mouth daily.   amoxicillin-clavulanate 875-125 MG tablet Commonly known as:  AUGMENTIN Take 1 tablet by mouth every  12 (twelve) hours.   atorvastatin 40 MG tablet Commonly known as:  LIPITOR Take 1 tablet (40 mg total) by mouth daily at 6 PM.   bisoprolol-hydrochlorothiazide 10-6.25 MG tablet Commonly known as:  ZIAC Take 1 tablet by mouth daily.   clopidogrel 75 MG tablet Commonly known as:  PLAVIX Take 1 tablet (75 mg total) by mouth daily.   diazepam 10 MG tablet Commonly known as:  VALIUM Take 1-2 tablets (10-20 mg total) by mouth 3 (three) times daily. Take one twice a day and two at bedtime What changed:  how much to take  how to take this  when to take this   escitalopram 20 MG tablet Commonly known as:  LEXAPRO Take 1 tablet (20 mg total) by mouth 2 (two) times daily.   gabapentin 300 MG capsule Commonly known as:  NEURONTIN Take 300 mg by mouth 3 (three) times daily.   loratadine 10 MG tablet Commonly known as:  CLARITIN Take 10 mg by mouth daily as needed for allergies.   pantoprazole 40 MG tablet Commonly known as:  PROTONIX TAKE (1) TABLET BY  MOUTH ONCE DAILY.   prazosin 5 MG capsule Commonly known as:  MINIPRESS Take 1 capsule (5 mg total) by mouth at bedtime.   QUEtiapine 100 MG tablet Commonly known as:  SEROQUEL Take 100 mg by mouth at bedtime.   ranitidine 150 MG capsule Commonly known as:  ZANTAC Take 150 mg by mouth 2 (two) times daily.            Discharge Care Instructions        Start     Ordered   12/26/16 0000  amoxicillin-clavulanate (AUGMENTIN) 875-125 MG tablet  Every 12 hours     12/26/16 1341   12/26/16 0000  atorvastatin (LIPITOR) 40 MG tablet  Daily-1800     12/26/16 1341   12/26/16 0000  clopidogrel (PLAVIX) 75 MG tablet  Daily     12/26/16 1341   12/26/16 0000  diazepam (VALIUM) 10 MG tablet  3 times daily     12/26/16 1341   12/26/16 0000  Increase activity slowly     12/26/16 1341   12/26/16 0000  Diet - low sodium heart healthy     12/26/16 1341     Follow-up Information    Monico Blitz, MD Follow up in 1 week(s).   Specialty:  Internal Medicine Contact information: [address] [place] [address] [phone]            Time coordinating discharge: 35 min  Signed:  Taquana Bartley Alison Stalling   [facility] 12/26/2016, 1:42 PM

## 2016-12-26 NOTE — Progress Notes (Signed)
Patient ID: Perry Cervantes, male   DOB: 06/06/1950, 66 y.o.   MRN: [id number] Patient admitted to [phone] via wheelchair, escorted by nursing staff.  Patient informed of rehab process.  Patient is able to answer orientation questions appropriately but in conversation will go off on tangents and not make much sense.  Patient with bilateral elbow wounds from previous fall at home.  Covered with telfa, kerlix and tape.  Multiple skin tears noted, covered with tegaderms.  Brita Romp, RN

## 2016-12-26 NOTE — H&P (Addendum)
Physical Medicine and Rehabilitation Admission H&P    Chief Complaint  Patient presents with  . Fall  : HPI: Perry Cervantes is a 66 y.o. right handed male with history of chronic back pain, hypertension, PTSD and tobacco abuse. Per chart review patient lives with wife who is retired. Patient uses occasional cane prior to admission. 2 level with bedroom on main level.Wife with a recent fall and can provide limited physical assistance.  Presented 12/23/2016 with multiple falls and right-sided weakness. Cranial CT reviewed, negative for acute process. CT cervical spine showed moderate central disc protrusion at C4-5. C5-C7 anterior fusion. 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 Echocardiogram with ejection fraction of 65% no wall motion abnormalities. Patient did not receive TPA. Carotid Dopplers had no ICA stenosis. White blood cell count elevated 16,200 with CK elevated 11,003 as well as tachycardia.EKG Left BBB Troponin negative with follow-up per cardiology services and no further plan for cardiac workup. MRA of the brain no anterior circulation stenosis or branch occlusion noted.. Urine drug screen positive for benzos. CK trending downward 2734. Neurology consulted placed on aspirin for CVA prophylaxis. Subcutaneous heparin for DVT prophylaxis.A follow-up chest x-ray completed showing low lung volume with new linear opacity in the left infrahilar region potentially representing atelectasis though difficult to exclude atypical infection. Patient was placed on Augmentin for empiric coverage. Physical therapy evaluation completed 12/25/2016 with recommendations of physical medicine rehabilitation consult.Patient was admitted for a comprehensive rehabilitation program  Review of Systems  Respiratory: Positive for cough.   Gastrointestinal: Positive for constipation.       GERD  Genitourinary: Negative for dysuria, flank pain and hematuria.    Musculoskeletal: Positive for back pain and falls.  Neurological: Positive for dizziness and weakness. Negative for seizures.  Psychiatric/Behavioral: Positive for depression.       PTSD, anxiety  All other systems reviewed and are negative.  Past Medical History:  Diagnosis Date  . Anxiety   . Chronic back pain    Spondylolisthesis, spondylosis, scoliosis, and radiculopathy  . Fibromyalgia   . Gastroesophageal reflux disease   . History of colon polyps   . History of kidney stones   . Hypertension   . Mild depression ([place])   . PTSD (post-traumatic stress disorder)   . Spondylolysis of lumbar region    Past Surgical History:  Procedure Laterality Date  . BACK SURGERY     x 2  . COLONOSCOPY  03/13/2005   RMR: Normal rectum. Diminutive polyp at hepatic flexure cold biopsied/removed (inflamed, benign). Otherwise normal colon, normal terminal ileum  . COLONOSCOPY N/A 07/28/2012   RMR: Colonic diverticulosis. diminutive colonic polyps (hyerplastic)-removed as described above. Colonic erosion ulceration ad described above. Status post biopsy. I suspect NSAID insult to his colon rather than inflammatory bowel disease . These finding could easily explain Hemocult-positve stool. EGD not needed at this time. Next TCS 07/2022.  . ESOPHAGOGASTRODUODENOSCOPY  03/13/2005   RMR: Small hiatal hernia  . NECK SURGERY  2008   fusion  . Right knee arthroscopy     x 2   Family History  Problem Relation Age of Onset  . Heart attack Mother   . Other Father        deceased age 34 of Staph sepsis  . Colon cancer Neg Hx    Social History:  reports that he has been smoking Cigarettes.  He has a 25.00 pack-year smoking history. He has never used  smokeless tobacco. He reports that he drinks alcohol. He reports that he uses drugs, including Marijuana. Allergies: No Known Allergies Medications Prior to Admission  Medication Sig Dispense Refill  . amLODipine (NORVASC) 10 MG tablet Take 10 mg by mouth  daily.     Marland Kitchen aspirin 81 MG tablet Take 81 mg by mouth daily.    . bisoprolol-hydrochlorothiazide (ZIAC) 10-6.25 MG per tablet Take 1 tablet by mouth daily.     . diazepam (VALIUM) 10 MG tablet Take one twice a day and two at bedtime (Patient taking differently: Take 10-20 mg by mouth 3 (three) times daily. Take one twice a day and two at bedtime) 120 tablet 2  . escitalopram (LEXAPRO) 20 MG tablet Take 1 tablet (20 mg total) by mouth 2 (two) times daily. 60 tablet 2  . gabapentin (NEURONTIN) 300 MG capsule Take 300 mg by mouth 3 (three) times daily.     Marland Kitchen ibuprofen (ADVIL,MOTRIN) 800 MG tablet Take 1 tablet (800 mg total) by mouth every 8 (eight) hours as needed for moderate pain. 20 tablet 0  . lisinopril (PRINIVIL,ZESTRIL) 5 MG tablet Take 5 mg by mouth daily.     Marland Kitchen loratadine (CLARITIN) 10 MG tablet Take 10 mg by mouth daily as needed for allergies.    . pantoprazole (PROTONIX) 40 MG tablet TAKE (1) TABLET BY MOUTH ONCE DAILY. 30 tablet 3  . prazosin (MINIPRESS) 5 MG capsule Take 1 capsule (5 mg total) by mouth at bedtime. 30 capsule 2  . QUEtiapine (SEROQUEL) 100 MG tablet Take 100 mg by mouth at bedtime.     . ranitidine (ZANTAC) 150 MG capsule Take 150 mg by mouth 2 (two) times daily.      Drug Regimen Review  Drug regimen was reviewed and remains appropriate with no significant issues identified  Home: Home Living Family/patient expects to be discharged to:: Inpatient rehab Living Arrangements: Spouse/significant other Available Help at Discharge: Family, Available 24 hours/day (wife cannot physically assist) Type of Home: House Home Access: Stairs to enter [employer] of Steps: 3 Entrance Stairs-Rails: None Home Layout: Two level, Able to live on main level with bedroom/bathroom Bathroom Shower/Tub: Chiropodist: Standard Home Equipment: Bedside commode, Environmental consultant - 2 wheels, Cane - single point, Hand held shower head  Lives With: Spouse     Functional History: Prior Function Level of Independence: Independent Comments: hx of many falls  Functional Status:  Mobility: Bed Mobility Overal bed mobility: Needs Assistance Bed Mobility: Supine to Sit Supine to sit: Max assist General bed mobility comments: assist for LEs over EOB, to raise trunk and to square hips up, pt requiring cues for sequencing, increased time Transfers Overall transfer level: Needs assistance Transfers: Sit to/from Stand, Stand Pivot Transfers Sit to Stand: Mod assist Stand pivot transfers: Mod assist Squat pivot transfers: Mod assist General transfer comment: R knee blocked, pt able to push up into standing but with lean to the L, modA to achieve midline and equal WBing between R and L foot. Pt with R Knee hyperextension. with max directional v/c's and modA pt able to complete stand pvt transfer with modA for R LE placement and advancement and to maintain balance. Pt initiated stepping with R foot, R knee did go into hyperextension with advance ment of L foot.      ADL: ADL Overall ADL's : Needs assistance/impaired Eating/Feeding: Set up, Sitting Grooming: Wash/dry hands, Wash/dry face, Moderate assistance, Sitting Upper Body Bathing: Maximal assistance, Sitting Lower Body Bathing: Total assistance,  Sitting/lateral leans Lower Body Bathing Details (indicate cue type and reason): socks Upper Body Dressing : Moderate assistance, Sitting Lower Body Dressing: Total assistance, Sitting/lateral leans Toilet Transfer: Moderate assistance, Squat-pivot, BSC Toileting- Clothing Manipulation and Hygiene: Total assistance, Sitting/lateral lean  Cognition: Cognition Overall Cognitive Status: Impaired/Different from baseline Arousal/Alertness: Awake/alert Orientation Level: Oriented X4 Attention: Sustained Sustained Attention: Impaired Sustained Attention Impairment: Verbal basic Memory: Impaired Memory Impairment: Storage deficit, Retrieval  deficit Awareness: Impaired Awareness Impairment: Intellectual impairment, Emergent impairment, Anticipatory impairment Problem Solving: Appears intact (for basic) Behaviors:  (anxious) Safety/Judgment: Impaired Cognition Arousal/Alertness: Awake/alert Behavior During Therapy: Impulsive Overall Cognitive Status: Impaired/Different from baseline Area of Impairment: Safety/judgement, Following commands, Attention, Problem solving Current Attention Level: Sustained Following Commands: Follows one step commands consistently (but easily distracted) Safety/Judgement: Decreased awareness of safety, Decreased awareness of deficits Problem Solving: Slow processing, Difficulty sequencing, Requires verbal cues, Requires tactile cues General Comments: pt with tangential speech, easily distracted. pt even became tearful during eval because "i just like to joke around, i'm not trying to be ugly"  Physical Exam: Blood pressure (!) 152/70, pulse (!) 101, temperature 98.4 F (36.9 C), temperature source Oral, resp. rate 18, height _0  (1.753 m), weight 79.4 kg (175 lb), SpO2 95 %. Physical Exam  Vitals reviewed. Constitutional: He is oriented to person, place, and time. He appears well-developed and well-nourished.  HENT:  Head: Normocephalic and atraumatic.  Eyes: EOM are normal. Right eye exhibits no discharge. Left eye exhibits no discharge.  Neck: Normal range of motion. Neck supple. No thyromegaly present.  Cardiovascular: Normal rate, regular rhythm and normal heart sounds.   Respiratory: Effort normal and breath sounds normal. No respiratory distress.  GI: Soft. Bowel sounds are normal. He exhibits no distension.  Musculoskeletal: He exhibits no edema or tenderness.  Neurological: He is alert and oriented to person, place, and time.  Motor: RUE; 0/5 proximal to distal RLE: HF, KE 3+/5, ADF/PF 0/5 LUE/LLE: 5/5 proximal to distal DTRs 3+ RUE/RLE Tangential, perseverative behviors  Skin:  Skin is warm and dry.  Scattered abrasions  Psychiatric: His mood appears anxious. He is hyperactive. He expresses impulsivity.    Results for orders placed or performed during the hospital encounter of 12/23/16 (from the past 48 hour(s))  Troponin I (q 6hr x 3)     Status: None   Collection Time: 12/24/16 12:44 PM  Result Value Ref Range   Troponin I <0.03 <0.03 ng/mL  Urine rapid drug screen (hosp performed)     Status: Abnormal   Collection Time: 12/25/16  4:19 AM  Result Value Ref Range   Opiates NONE DETECTED NONE DETECTED   Cocaine NONE DETECTED NONE DETECTED   Benzodiazepines POSITIVE (A) NONE DETECTED   Amphetamines NONE DETECTED NONE DETECTED   Tetrahydrocannabinol NONE DETECTED NONE DETECTED   Barbiturates NONE DETECTED NONE DETECTED    Comment:        DRUG SCREEN FOR MEDICAL PURPOSES ONLY.  IF CONFIRMATION IS NEEDED FOR ANY PURPOSE, NOTIFY LAB WITHIN 5 DAYS.        LOWEST DETECTABLE LIMITS FOR URINE DRUG SCREEN Drug Class       Cutoff (ng/mL) Amphetamine      1000 Barbiturate      200 Benzodiazepine   450 Tricyclics       388 Opiates          300 Cocaine          300 THC  50   CK     Status: Abnormal   Collection Time: 12/25/16  5:39 AM  Result Value Ref Range   Total CK 2,734 (H) 49 - 397 U/L  CBC     Status: Abnormal   Collection Time: 12/25/16  5:39 AM  Result Value Ref Range   WBC 8.3 4.0 - 10.5 K/uL   RBC 3.66 (L) 4.22 - 5.81 MIL/uL   Hemoglobin 12.6 (L) 13.0 - 17.0 g/dL   HCT 36.7 (L) 39.0 - 52.0 %   MCV 100.3 (H) 78.0 - 100.0 fL   MCH 34.4 (H) 26.0 - 34.0 pg   MCHC 34.3 30.0 - 36.0 g/dL   RDW 13.7 11.5 - 15.5 %   Platelets 191 150 - 400 K/uL  Comprehensive metabolic panel     Status: Abnormal   Collection Time: 12/25/16  5:39 AM  Result Value Ref Range   Sodium 139 135 - 145 mmol/L   Potassium 4.0 3.5 - 5.1 mmol/L   Chloride 111 101 - 111 mmol/L   CO2 22 22 - 32 mmol/L   Glucose, Bld 105 (H) 65 - 99 mg/dL   BUN 22 (H) 6 - 20  mg/dL   Creatinine, Ser 1.04 0.61 - 1.24 mg/dL   Calcium 8.4 (L) 8.9 - 10.3 mg/dL   Total Protein 5.1 (L) 6.5 - 8.1 g/dL   Albumin 3.1 (L) 3.5 - 5.0 g/dL   AST 60 (H) 15 - 41 U/L   ALT 37 17 - 63 U/L   Alkaline Phosphatase 53 38 - 126 U/L   Total Bilirubin 0.6 0.3 - 1.2 mg/dL   GFR calc non Af Amer >60 >60 mL/min   GFR calc Af Amer >60 >60 mL/min    Comment: (NOTE) The eGFR has been calculated using the CKD EPI equation. This calculation has not been validated in all clinical situations. eGFR's persistently <60 mL/min signify possible Chronic Kidney Disease.    Anion gap 6 5 - 15  CBC     Status: Abnormal   Collection Time: 12/26/16  5:27 AM  Result Value Ref Range   WBC 14.2 (H) 4.0 - 10.5 K/uL   RBC 3.64 (L) 4.22 - 5.81 MIL/uL   Hemoglobin 12.3 (L) 13.0 - 17.0 g/dL   HCT 36.7 (L) 39.0 - 52.0 %   MCV 100.8 (H) 78.0 - 100.0 fL   MCH 33.8 26.0 - 34.0 pg   MCHC 33.5 30.0 - 36.0 g/dL   RDW 13.6 11.5 - 15.5 %   Platelets 171 150 - 400 K/uL  Basic metabolic panel     Status: Abnormal   Collection Time: 12/26/16  5:27 AM  Result Value Ref Range   Sodium 137 135 - 145 mmol/L   Potassium 4.2 3.5 - 5.1 mmol/L   Chloride 108 101 - 111 mmol/L   CO2 23 22 - 32 mmol/L   Glucose, Bld 104 (H) 65 - 99 mg/dL   BUN 17 6 - 20 mg/dL   Creatinine, Ser 1.02 0.61 - 1.24 mg/dL   Calcium 8.2 (L) 8.9 - 10.3 mg/dL   GFR calc non Af Amer >60 >60 mL/min   GFR calc Af Amer >60 >60 mL/min    Comment: (NOTE) The eGFR has been calculated using the CKD EPI equation. This calculation has not been validated in all clinical situations. eGFR's persistently <60 mL/min signify possible Chronic Kidney Disease.    Anion gap 6 5 - 15   Mr [facility] Contrast  Result  Date: 12/26/2016 CLINICAL DATA:  Stroke follow-up. EXAM: MRA HEAD WITHOUT CONTRAST TECHNIQUE: Angiographic images of the Circle of Willis were obtained using MRA technique without intravenous contrast. COMPARISON:  Brain MRI from yesterday  FINDINGS: Symmetric carotid arteries. Dominant right vertebral artery. Intact circle-of-Willis. No flow seen within the left vertebral artery before the patent PICA, likely occlusion with retrograde flow via the basilar. Retrograde flow in the non-opacified segment is considered unlikely given the robust signal in the distal V4 segment and PICA. The other vessels are smooth and widely patent. There is a small inferior bulge from the mid cavernous left ICA, suspect an infundibulum due to shape and location, ~1.5 mm. No evidence of intradural aneurysm. IMPRESSION: 1. No detected flow in the non dominant proximal left V4 segment with presumed retrograde filling of the patent left PICA. Widely patent left vertebral and basilar arteries. 2. No anterior circulation stenosis or branch occlusion noted. 3. 1-2 mm left mid cavernous ICA bulge, favor infundibulum. No evidence of intradural aneurysm. Electronically Signed   By: Monte Fantasia M.D.   On: 12/26/2016 10:08   Mr Brain Wo Contrast  Result Date: 12/25/2016 CLINICAL DATA:  Stroke follow-up. Right lower extremity goes to sleep. Weakness and right upper extremity. EXAM: MRI HEAD WITHOUT CONTRAST TECHNIQUE: Multiplanar, multiecho pulse sequences of the brain and surrounding structures were obtained without intravenous contrast. COMPARISON:  Head CT from 2 days ago FINDINGS: Brain: 2 cm long area of wedge-shaped restricted diffusion crossing the left corona radiata. No acute hemorrhage, hydrocephalus, or masslike findings. Small remote left cerebellar infarct. Mild scattered FLAIR hyperintensities in the cerebral white matter, usually from chronic small vessel ischemia, although nonspecific. No chronic hemorrhagic foci. Vascular: Absent flow void in the non dominant proximal left V4 segment which appears normalized after the pica, possible retrograde flow. Skull and upper cervical spine: Negative for marrow lesion Sinuses/Orbits: Negative IMPRESSION: 1. ~2 cm acute  infarct in the left corona radiata. 2. Mild cerebral white matter disease, nonspecific but often from chronic small vessel ischemia. 3. Slow or absent flow in the proximal left V4 segment with normalized appearance at the vertebrobasilar junction. Electronically Signed   By: Monte Fantasia M.D.   On: 12/25/2016 09:00   Dg Chest Port 1 View  Result Date: 12/26/2016 CLINICAL DATA:  66 year old male with a history of cough and hypertension EXAM: PORTABLE CHEST 1 VIEW COMPARISON:  05/07/2013, 04/26/2006 FINDINGS: Cardiomediastinal silhouette unchanged in size and contour. No evidence of central vascular congestion. Lung volumes are low with crowding of the interstitium. Linear opacity in the left infrahilar region. No large pleural effusion. No pneumothorax. Surgical changes of ACDF, incompletely imaged. New eggshell calcification in the left axillary region. IMPRESSION: Low lung volumes with new linear opacity in the left infrahilar region, potentially representing atelectasis though difficult to exclude atypical infection. New eggshell calcifications in the left axillary soft tissues, potentially related to lymph nodes. If there is concern for lymphoproliferative disorder, correlation with lab values may be useful. Electronically Signed   By: Corrie Mckusick D.O.   On: 12/26/2016 08:32    Medical Problem List and Plan: 1.  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  DVT Prophylaxis/Anticoagulation: Subcutaneous heparin. Monitor platelet counts and any signs of bleeding 3. Pain Management/chronic back pain: Neurontin 300 mg 3 times a day, hydrocodone as needed 4. Mood/PTSD: Lexapro 20 mg daily, Seroquel 100 mg daily, Valium 10 mg every 12 hours as needed 5 mg at bedtime as needed 5. Neuropsych: This  patient is not fully capable of making decisions on his own behalf. 6. Skin/Wound Care: Routine skin checks 7. Fluids/Electrolytes/Nutrition: Routine I&O with follow-up chemistries 8.  Hypertension. Minipress 5 mg daily at bedtime,Ziac 10-6..25 mg daily. 9. Hyperlipidemia. Lipitor 10. Rhabdomyolysis. Follow-up chemistries. CK trending down 11. Left BBB with tachycardia. Echocardiogram reviewed per cardiology services troponin negative. No further plans for cardiac workup 12. Leukocytosis with possible pneumonia. Empiric Augmentin added 12/26/2016. 13. Tobacco abuse. Counseling   Post Admission Physician Evaluation: 1. Preadmission assessment reviewed and changes made below. 2. Functional deficits secondary  to left corona radiata infarct. 3. Patient is admitted to receive collaborative, interdisciplinary care between the physiatrist, rehab nursing staff, and therapy team. 4. Patient's level of medical complexity and substantial therapy needs in context of that medical necessity cannot be provided at a lesser intensity of care such as a SNF. 5. Patient has experienced substantial functional loss from his/her baseline which was documented above under the "Functional History" and "Functional Status" headings.  Judging by the patient's diagnosis, physical exam, and functional history, the patient has potential for functional progress which will result in measurable gains while on inpatient rehab.  These gains will be of substantial and practical use upon discharge  in facilitating mobility and self-care at the household level. 70. Physiatrist will provide 24 hour management of medical needs as well as oversight of the therapy plan/treatment and provide guidance as appropriate regarding the interaction of the two. 7. 24 hour rehab nursing will assist with safety, disease management and patient education  and help integrate therapy concepts, techniques,education, etc. 8. PT will assess and treat for/with: Lower extremity strength, range of motion, stamina, balance, functional mobility, safety, adaptive techniques and equipment, coping skills, pain control, stroke education.   Goals are:  Min A. 9. OT will assess and treat for/with: ADL's, functional mobility, safety, upper extremity strength, adaptive techniques and equipment, ego support, and community reintegration.   Goals are: Min A. Therapy may proceed with showering this patient. 10. SLP will assess and treat for/with: cognition, language.  Goals are: supervision. 11. Case Management and Social Worker will assess and treat for psychological issues and discharge planning. 12. Team conference will be held weekly to assess progress toward goals and to determine barriers to discharge. 13. Patient will receive at least 3 hours of therapy per day at least 5 days per week. 14. ELOS: 16-19 days.       15. Prognosis:  good   Delice Lesch, MD, Mellody Drown Cathlyn Parsons., PA-C 12/26/2016

## 2016-12-26 NOTE — Progress Notes (Signed)
Rehab admissions - I met with patient at the bedside.  He would like inpatient rehab admission prior to home with his wife.  Will await neuro clearance and then can potentially admit to acute inpatient rehab today.  Call me for questions.  #[phone]

## 2016-12-26 NOTE — Consult Note (Signed)
Physical Medicine and Rehabilitation Consult Reason for Consult: Right sided weakness Referring Physician: [facility]   HPI: Perry Cervantes is a 66 y.o. right handed male with history of chronic back pain, hypertension, PTSD and tobacco abuse. Per chart review patient lives with wife who is retired. Patient uses occasional cane prior to admission. 2 level with bedroom on main level. Presented 12/23/2016 with multiple falls and right-sided weakness. Cranial CT scan negative for acute changes. CT cervical spine showed moderate central disc protrusion at C4-5. C5-C7 anterior fusion. 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 Echocardiogram with ejection fraction of 65% no wall motion abnormalities. Patient did not receive TPA. Carotid Dopplers had no ICA stenosis. White blood cell count elevated 16,200 with CK elevated 11,003. Troponin negative. MRA of the brain is pending. Urine drug screen pending. Neurology consulted placed on aspirin for CVA prophylaxis. Subcutaneous heparin for DVT prophylaxis. Physical therapy evaluation completed 12/25/2016 with recommendations of physical medicine rehabilitation consult.  Patient focused on his PTSD, states that his medications were off schedule. Last night. Upset that he had to go to an MRI scan and let his breakfast to get cold.   Review of Systems  Constitutional: Negative for chills and fever.  HENT: Negative for hearing loss.   Eyes: Negative for blurred vision and double vision.  Respiratory: Positive for cough. Negative for shortness of breath.   Cardiovascular: Negative for chest pain, palpitations and leg swelling.  Gastrointestinal: Positive for constipation. Negative for nausea and vomiting.  Genitourinary: Negative for dysuria, flank pain and hematuria.  Musculoskeletal: Positive for back pain and falls.  Skin: Negative for rash.  Neurological: Positive for dizziness and weakness. Negative for seizures.   Psychiatric/Behavioral: Positive for depression.       Anxiety, PTSD  All other systems reviewed and are negative.  Past Medical History:  Diagnosis Date  . Anxiety   . Chronic back pain    Spondylolisthesis, spondylosis, scoliosis, and radiculopathy  . Fibromyalgia   . Gastroesophageal reflux disease   . History of colon polyps   . History of kidney stones   . Hypertension   . Mild depression ([place])   . PTSD (post-traumatic stress disorder)   . Spondylolysis of lumbar region    Past Surgical History:  Procedure Laterality Date  . BACK SURGERY     x 2  . COLONOSCOPY  03/13/2005   RMR: Normal rectum. Diminutive polyp at hepatic flexure cold biopsied/removed (inflamed, benign). Otherwise normal colon, normal terminal ileum  . COLONOSCOPY N/A 07/28/2012   RMR: Colonic diverticulosis. diminutive colonic polyps (hyerplastic)-removed as described above. Colonic erosion ulceration ad described above. Status post biopsy. I suspect NSAID insult to his colon rather than inflammatory bowel disease . These finding could easily explain Hemocult-positve stool. EGD not needed at this time. Next TCS 07/2022.  . ESOPHAGOGASTRODUODENOSCOPY  03/13/2005   RMR: Small hiatal hernia  . NECK SURGERY  2008   fusion  . Right knee arthroscopy     x 2   Family History  Problem Relation Age of Onset  . Heart attack Mother   . Other Father        deceased age 38 of Staph sepsis  . Colon cancer Neg Hx    Social History:  reports that he has been smoking Cigarettes.  He has a 25.00 pack-year smoking history. He has never used smokeless tobacco. He reports that he drinks alcohol. He reports that  he uses drugs, including Marijuana. Allergies: No Known Allergies Medications Prior to Admission  Medication Sig Dispense Refill  . amLODipine (NORVASC) 10 MG tablet Take 10 mg by mouth daily.     Marland Kitchen aspirin 81 MG tablet Take 81 mg by mouth daily.    . bisoprolol-hydrochlorothiazide (ZIAC) 10-6.25 MG per tablet  Take 1 tablet by mouth daily.     . diazepam (VALIUM) 10 MG tablet Take one twice a day and two at bedtime (Patient taking differently: Take 10-20 mg by mouth 3 (three) times daily. Take one twice a day and two at bedtime) 120 tablet 2  . escitalopram (LEXAPRO) 20 MG tablet Take 1 tablet (20 mg total) by mouth 2 (two) times daily. 60 tablet 2  . gabapentin (NEURONTIN) 300 MG capsule Take 300 mg by mouth 3 (three) times daily.     Marland Kitchen ibuprofen (ADVIL,MOTRIN) 800 MG tablet Take 1 tablet (800 mg total) by mouth every 8 (eight) hours as needed for moderate pain. 20 tablet 0  . lisinopril (PRINIVIL,ZESTRIL) 5 MG tablet Take 5 mg by mouth daily.     Marland Kitchen loratadine (CLARITIN) 10 MG tablet Take 10 mg by mouth daily as needed for allergies.    . pantoprazole (PROTONIX) 40 MG tablet TAKE (1) TABLET BY MOUTH ONCE DAILY. 30 tablet 3  . prazosin (MINIPRESS) 5 MG capsule Take 1 capsule (5 mg total) by mouth at bedtime. 30 capsule 2  . QUEtiapine (SEROQUEL) 100 MG tablet Take 100 mg by mouth at bedtime.     . ranitidine (ZANTAC) 150 MG capsule Take 150 mg by mouth 2 (two) times daily.      Home: Home Living Family/patient expects to be discharged to:: Inpatient rehab Living Arrangements: Spouse/significant other Available Help at Discharge: Family, Available 24 hours/day (wife cannot physically assist) Type of Home: House Home Access: Stairs to enter [employer] of Steps: 3 Entrance Stairs-Rails: None Home Layout: Two level, Able to live on main level with bedroom/bathroom Bathroom Shower/Tub: Chiropodist: Standard Home Equipment: Bedside commode, Environmental consultant - 2 wheels, Cane - single point, Hand held shower head  Lives With: Spouse  Functional History: Prior Function Level of Independence: Independent Comments: hx of many falls Functional Status:  Mobility: Bed Mobility Overal bed mobility: Needs Assistance Bed Mobility: Supine to Sit Supine to sit: Max assist General  bed mobility comments: assist for LEs over EOB, to raise trunk and to square hips up, pt requiring cues for sequencing, increased time Transfers Overall transfer level: Needs assistance Transfers: Sit to/from Stand, Stand Pivot Transfers Sit to Stand: Mod assist Stand pivot transfers: Mod assist Squat pivot transfers: Mod assist General transfer comment: R knee blocked, pt able to push up into standing but with lean to the L, modA to achieve midline and equal WBing between R and L foot. Pt with R Knee hyperextension. with max directional v/c's and modA pt able to complete stand pvt transfer with modA for R LE placement and advancement and to maintain balance. Pt initiated stepping with R foot, R knee did go into hyperextension with advance ment of L foot.      ADL: ADL Overall ADL's : Needs assistance/impaired Eating/Feeding: Set up, Sitting Grooming: Wash/dry hands, Wash/dry face, Moderate assistance, Sitting Upper Body Bathing: Maximal assistance, Sitting Lower Body Bathing: Total assistance, Sitting/lateral leans Lower Body Bathing Details (indicate cue type and reason): socks Upper Body Dressing : Moderate assistance, Sitting Lower Body Dressing: Total assistance, Sitting/lateral leans Toilet Transfer: Moderate assistance, Squat-pivot,  BSC Toileting- Clothing Manipulation and Hygiene: Total assistance, Sitting/lateral lean  Cognition: Cognition Overall Cognitive Status: Impaired/Different from baseline Arousal/Alertness: Awake/alert Orientation Level: Oriented X4 Attention: Sustained Sustained Attention: Impaired Sustained Attention Impairment: Verbal basic Memory: Impaired Memory Impairment: Storage deficit, Retrieval deficit Awareness: Impaired Awareness Impairment: Intellectual impairment, Emergent impairment, Anticipatory impairment Problem Solving: Appears intact (for basic) Behaviors:  (anxious) Safety/Judgment: Impaired Cognition Arousal/Alertness:  Awake/alert Behavior During Therapy: Impulsive Overall Cognitive Status: Impaired/Different from baseline Area of Impairment: Safety/judgement, Following commands, Attention, Problem solving Current Attention Level: Sustained Following Commands: Follows one step commands consistently (but easily distracted) Safety/Judgement: Decreased awareness of safety, Decreased awareness of deficits Problem Solving: Slow processing, Difficulty sequencing, Requires verbal cues, Requires tactile cues General Comments: pt with tangential speech, easily distracted. pt even became tearful during eval because "i just like to joke around, i'm not trying to be ugly"  Blood pressure (!) 152/70, pulse (!) 101, temperature 98.4 F (36.9 C), temperature source Oral, resp. rate 18, height 5\' 9"  (1.753 m), weight 79.4 kg (175 lb), SpO2 95 %. Physical Exam  Vitals reviewed. Constitutional: He appears well-developed.  HENT:  Head: Normocephalic.  Eyes: EOM are normal.  Neck: Normal range of motion. Neck supple. No thyromegaly present.  Cardiovascular: Normal rate, regular rhythm and normal heart sounds.   Respiratory: Effort normal and breath sounds normal. No respiratory distress.  GI: Soft. Bowel sounds are normal. He exhibits no distension.  Neurological: He is alert.  Patient is a bit anxious as well as impulsive. Follows simple commands. Oriented to person place and time.  Skin: Skin is warm and dry.  Right upper extremity 0/5 in the deltoid by stress of grip Right lower extremity 3 minus. Hip flexion, knee extension 2 minus, ankle dorsiflexion Left upper extremity 5/5 in the left deltoid, bicep, tricep, grip Left Lower extremity 5/5 in hip flexion, knee extension, ankle dorsi flexion, plantar flexion Sensation is reported as equal in bilateral upper and lower limbs to light touch  Results for orders placed or performed during the hospital encounter of 12/23/16 (from the past 24 hour(s))  CBC     Status:  Abnormal   Collection Time: 12/26/16  5:27 AM  Result Value Ref Range   WBC 14.2 (H) 4.0 - 10.5 K/uL   RBC 3.64 (L) 4.22 - 5.81 MIL/uL   Hemoglobin 12.3 (L) 13.0 - 17.0 g/dL   HCT 36.7 (L) 39.0 - 52.0 %   MCV 100.8 (H) 78.0 - 100.0 fL   MCH 33.8 26.0 - 34.0 pg   MCHC 33.5 30.0 - 36.0 g/dL   RDW 13.6 11.5 - 15.5 %   Platelets 171 150 - 400 K/uL   Mr Brain Wo Contrast  Result Date: 12/25/2016 CLINICAL DATA:  Stroke follow-up. Right lower extremity goes to sleep. Weakness and right upper extremity. EXAM: MRI HEAD WITHOUT CONTRAST TECHNIQUE: Multiplanar, multiecho pulse sequences of the brain and surrounding structures were obtained without intravenous contrast. COMPARISON:  Head CT from 2 days ago FINDINGS: Brain: 2 cm long area of wedge-shaped restricted diffusion crossing the left corona radiata. No acute hemorrhage, hydrocephalus, or masslike findings. Small remote left cerebellar infarct. Mild scattered FLAIR hyperintensities in the cerebral white matter, usually from chronic small vessel ischemia, although nonspecific. No chronic hemorrhagic foci. Vascular: Absent flow void in the non dominant proximal left V4 segment which appears normalized after the pica, possible retrograde flow. Skull and upper cervical spine: Negative for marrow lesion Sinuses/Orbits: Negative IMPRESSION: 1. ~2 cm acute infarct in the left  corona radiata. 2. Mild cerebral white matter disease, nonspecific but often from chronic small vessel ischemia. 3. Slow or absent flow in the proximal left V4 segment with normalized appearance at the vertebrobasilar junction. Electronically Signed   By: Monte Fantasia M.D.   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 Does the need for close, 24 hr/day medical supervision in concert with the patient's rehab needs make it unreasonable for this patient to be served in a less intensive setting? Yes 2. Co-Morbidities  requiring supervision/potential complications: PTSD, chronic benzodiazepine use, chronic low back pain, hypertension, rhabdomyolysis 3. Due to bladder management, bowel management, safety, skin/wound care, disease management, medication administration, pain management and patient education, does the patient require 24 hr/day rehab nursing? Yes 4. Does the patient require coordinated care of a physician, rehab nurse, PT (1-2 hrs/day, 5 days/week) and OT (1-2 hrs/day, 5 days/week) to address physical and functional deficits in the context of the above medical diagnosis(es)? Yes Addressing deficits in the following areas: balance, endurance, locomotion, strength, transferring, bowel/bladder control, bathing, dressing, feeding, grooming, toileting and psychosocial support 5. Can the patient actively participate in an intensive therapy program of at least 3 hrs of therapy per day at least 5 days per week? Yes 6. The potential for patient to make measurable gains while on inpatient rehab is good 7. Anticipated functional outcomes upon discharge from inpatient rehab are supervision  with PT, supervision with OT, n/a with SLP. 8. Estimated rehab length of stay to reach the above functional goals is: 12-16d 9. Anticipated D/C setting: Home 10. Anticipated post D/C treatments: [place] therapy 11. Overall Rehab/Functional Prognosis: good  RECOMMENDATIONS: This patient's condition is appropriate for continued rehabilitative care in the following setting: CIR Patient has agreed to participate in recommended program. Yes Note that insurance prior authorization may be required for reimbursement for recommended care.  Comment:    Cathlyn Parsons., PA-C 12/26/2016

## 2016-12-26 NOTE — Progress Notes (Signed)
STROKE TEAM PROGRESS NOTE   HISTORY OF PRESENT ILLNESS (per record) Right-sided weakness  HPI: Perry Cervantes is a 66 y.o. male who has a past medical history of depression, PTSD, hypertension presented for evaluation of complaints of multiple falls over the past few days on 12/23/2016. He was found down because he was feeling weak and was unable to support his weight on the right side.  He had elevated CK on arrival.  He was admitted for evaluation of a possible stroke versus a spinal cord pathology.  MRI of the brain was done that revealed a left corona radiata stroke.  Neurology service was then consulted for recommendations on management of the stroke and stroke risk factor workup. Next line patient denies any preceding illnesses sicknesses. He denies any nausea vomiting. Denies any visual complaints.  Denies chest pain shortness of breath.  LKW: Sometime on 12/22/2016 Premorbid modified Rankin scale (mRS):  0-Completely asymptomatic and back to baseline post-stroke  Patient was not administered IV t-PA secondary to arriving outside of the tPA treatment window. He was admitted to General Neurology for further evaluation and treatment.   SUBJECTIVE (INTERVAL HISTORY) He is watching television alone in the room.  The patient is awake, alert, and follows all commands appropriately.   OBJECTIVE Temp:  [98.4 F (36.9 C)-99 F (37.2 C)] 98.4 F (36.9 C) (09/05 0558) Pulse Rate:  [91-121] 101 (09/05 0558) Cardiac Rhythm: Sinus tachycardia (09/05 0700) Resp:  [18] 18 (09/05 0558) BP: (125-152)/(61-70) 152/70 (09/05 0558) SpO2:  [92 %-95 %] 95 % (09/05 0558)  CBC:   Recent Labs Lab 12/23/16 1530 12/25/16 0539 12/26/16 0527  WBC 16.2* 8.3 14.2*  NEUTROABS 11.9*  --   --   HGB 15.6 12.6* 12.3*  HCT 44.3 36.7* 36.7*  MCV 98.4 100.3* 100.8*  PLT 249 191 678    Basic Metabolic Panel:   Recent Labs Lab 12/25/16 0539 12/26/16 0527  NA 139 137  K 4.0 4.2  CL 111 108   CO2 22 23  GLUCOSE 105* 104*  BUN 22* 17  CREATININE 1.04 1.02  CALCIUM 8.4* 8.2*    Lipid Panel:     Component Value Date/Time   CHOL 174 12/24/2016 0010   TRIG 167 (H) 12/24/2016 0010   HDL 34 (L) 12/24/2016 0010   CHOLHDL 5.1 12/24/2016 0010   VLDL 33 12/24/2016 0010   LDLCALC 107 (H) 12/24/2016 0010   HgbA1c:  Lab Results  Component Value Date   HGBA1C 4.9 12/24/2016   Urine Drug Screen:     Component Value Date/Time   LABOPIA NONE DETECTED 12/25/2016 0419   COCAINSCRNUR NONE DETECTED 12/25/2016 0419   LABBENZ POSITIVE (A) 12/25/2016 0419   AMPHETMU NONE DETECTED 12/25/2016 0419   THCU NONE DETECTED 12/25/2016 0419   LABBARB NONE DETECTED 12/25/2016 0419    Alcohol Level     Component Value Date/Time   [facility] <5 12/23/2016 1959    IMAGING  Mr Jodene Nam Head Wo Contrast 12/26/2016 IMPRESSION: 1. No detected flow in the non dominant proximal left V4 segment with presumed retrograde filling of the patent left PICA. Widely patent left vertebral and basilar arteries. 2. No anterior circulation stenosis or branch occlusion noted. 3. 1-2 mm left mid cavernous ICA bulge, favor infundibulum. No evidence of intradural aneurysm.  Mr Brain Wo Contrast 12/25/2016 IMPRESSION: 1. ~2 cm acute infarct in the left corona radiata. 2. Mild cerebral white matter disease, nonspecific but often from chronic small vessel ischemia. 3. Slow or absent  flow in the proximal left V4 segment with normalized appearance at the vertebrobasilar junction.   Dg Chest Port 1 View 12/26/2016 IMPRESSION: Low lung volumes with new linear opacity in the left infrahilar region, potentially representing atelectasis though difficult to exclude atypical infection. New eggshell calcifications in the left axillary soft tissues, potentially related to lymph nodes. If there is concern for lymphoproliferative disorder, correlation with lab values may be useful.  Carotid US 12/26/2016 B ICA 1-39% stenosis, VAs  antegrade  TTE 12/26/2016 Left ventricle: The cavity size was normal. Systolic function was   normal. The estimated ejection fraction was in the range of 60%   to 65%. Wall motion was normal; there were no regional wall   motion abnormalities. Left ventricular diastolic function   parameters were normal.   PHYSICAL EXAM Irritable middle aged Caucasian male not in distress. . Afebrile. Head is nontraumatic. Neck is supple without bruit.    Cardiac exam no murmur or gallop. Lungs are clear to auscultation. Distal pulses are well felt. Neurological Exam :  Awake alert oriented x3 normal speech and language. Extraocular movements are full range without nystagmus. Vision acuity and fields appear normal. Fundi were not visualized. Mild right lower face weakness. Tongue midline. Right mild hemiplegia  with right upper extremity drift significant weakness of right hand and grip. Mild weakness of right hip flexors. Fine finger movements are diminished on the right. Orbits left over right upper extremity. Impaired right finger-to-nose coordination. Sensation is preserved bilaterally. Deep tendon reflexes are diminished on the right and normal on the left. Right plantar is equally local and left is downgoing. Gait not tested. ASSESSMENT/PLAN Mr. Perry Cervantes is a 66 y.o. male with history depression, PTSD, hypertension presented for evaluation of complaints of multiple falls over the past few days on 12/23/2016. He did not receive IV t-PA due to arriving outside of the treatment window.    Stroke: 1 - 2 cm acute infarct in the left corona radiata, likely from small vessel disease, Resultant  Right hemiplegia  CT head: not performed  MRI head: 1. ~2 cm acute infarct in the left corona radiata.  Slow or absent flow from proximal L V4 segment.  MRA head: No detected flow in the non dominant proximal left V4 segment with presumed retrograde filling of the patent left PICA   Carotid Doppler: B ICA  1-39% stenosis, VAs antegrade 2D Echo Left ventricle: The cavity size was normal. Systolic function was   normal. The estimated ejection fraction was in the range of 60%   to 65%. Wall motion was normal; there were no regional wall   motion abnormalities. Left ventricular diastolic function    parameters were normal.   LDL107  HgbA1c 4.9  SCDs for VTE prophylaxis Diet Heart Room service appropriate? Yes; Fluid consistency: Thin  aspirin 81 mg daily prior to admission, now on clopidogrel 75 mg daily  Patient counseled to be compliant with his antithrombotic medications  Ongoing aggressive stroke risk factor management  Therapy recommendations:  CIR  Disposition:  Discharge to CIR  Hypertension  Stable  Permissive hypertension (OK if < 220/120) but gradually normalize in 5-7 days  Long-term BP goal normotensive  Hyperlipidemia  Home meds:  none  LDL 107, goal < 70  Add atorvastatin 40 mg PO daily  Continue statin at discharge  Other Stroke Risk Factors  Cigarette smokeradvised to stop smoking  ETOH use, advised to drink no more than 1-2 drink(s) a day  Other Active  Problems  None  Hospital day # 2  I have personally examined this patient, reviewed notes, independently viewed imaging studies, participated in medical decision making and plan of care.ROS completed by me personally and pertinent positives fully documented  I have made any additions or clarifications directly to the above note. He presented with right hemiplegia do to left subcortical infarct from small vessel disease. Recommend change aspirin to Plavix for stroke prevention and start atorvastatin and aggressive risk factor modification. Greater than 50% time during this 35 minute visit was spent on counseling and coordination of care about his lacunar stroke, risk factor modification discussion and answering questions. Discussed with Dr. Eliseo Squires. Antony Contras, MD Medical Director [facility] Pager: [phone] 12/26/2016 8:50 PM   To contact Stroke Continuity provider, please refer to [email or web address] After hours, contact General Neurology

## 2016-12-26 NOTE — Progress Notes (Signed)
Occupational Therapy Treatment Patient Details Name: Perry Cervantes MRN: [id number] DOB: July 12, 1950 Today's Date: 12/26/2016    History of present illness SALIK GREWELL JRis a 66 y.o.malewith medical history significant for chronic back pain, depression, hypertension, PTSD. Patient presented with complaints of multiple falls. Over the past 2 years he has been falling more frequently but reports since Saturday morning- 12/22/16, he is falling much more than usual. He describes trying to stand up and suddenly feeling like his right lower extremity had "gone to sleep", also with weakness in his right upper extremity. 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   OT comments  Pt making progress towards OT goals this session. Focused on sitting balance as precursor for ADL activity. RUE remains with intact sensation but no movement fine or gross. Pt continues to require significant assist for transfers and easily distracted. Pt continues to require skilled OT in the acute setting and will require CIR level therapy at dc to maximize safety and independence in ADL (updated from previous note).   Follow Up Recommendations  CIR;Supervision/Assistance - 24 hour    Equipment Recommendations  Other (comment) (defer to next venue)    Recommendations for Other Services      Precautions / Restrictions Precautions Precautions: Fall Restrictions Weight Bearing Restrictions: No       Mobility Bed Mobility Overal bed mobility: Needs Assistance Bed Mobility: Supine to Sit     Supine to sit: Mod assist     General bed mobility comments: Patient able to bring LEs to EOB, moderate assist to elevate trunk to upright   Transfers Overall transfer level: Needs assistance   Transfers: Sit to/from Stand;Stand Pivot Transfers Sit to Stand: Mod assist Stand pivot transfers: Mod assist;+2 physical assistance       General transfer comment: Patient required knee blocking RLE and  facilitation of weight shifts to advance RLE during pivotal movements to chair    Balance Overall balance assessment: Needs assistance Sitting-balance support: Feet supported;Single extremity supported Sitting balance-Leahy Scale: Fair Sitting balance - Comments: pt tolerated sitting x 10 min, pt with strong posterior lean with LE LAQ requiring assist to come back to midline   Standing balance support: During functional activity;Single extremity supported Standing balance-Leahy Scale: Poor Standing balance comment: Significant reliance on therapist assist                           ADL either performed or assessed with clinical judgement   ADL Overall ADL's : Needs assistance/impaired     Grooming: Wash/dry hands;Oral care;Sitting;Moderate assistance Grooming Details (indicate cue type and reason): Pt performing with LUE         Upper Body Dressing : Moderate assistance;Sitting Upper Body Dressing Details (indicate cue type and reason): to don hospital gown as robe     Toilet Transfer: Moderate assistance;+2 for physical assistance;Stand-pivot;BSC Toilet Transfer Details (indicate cue type and reason): simulated through recliner transfer           General ADL Comments: RUE remains flaccid, sensation intact, but no movement     Vision       Perception     Praxis      Cognition Arousal/Alertness: Awake/alert Behavior During Therapy: Impulsive Overall Cognitive Status: Impaired/Different from baseline                     Current Attention Level: Sustained   Following Commands: Follows one step commands consistently (but  easily distracted) Safety/Judgement: Decreased awareness of safety;Decreased awareness of deficits   Problem Solving: Slow processing;Difficulty sequencing;Requires verbal cues;Requires tactile cues General Comments: patient with poor attention, restlessness, imulsivity and poor ability to sustain task performance. Very easily  distracted and tangential during session        Exercises Other Exercises Other Exercises: session focused on EOB dynamic balance activities. Reaching forward x5, trunk flexion and repositioning x5, trunk extension with midline facilitation x5. Patient easily distracted with poor ability to maintain mid line Other Exercises: lateral lean (left elbow for midline faciliataton 5 secon holds x3) Other Exercises: Pre gait static standing prior to transfer to chair   Shoulder Instructions       General Comments      Pertinent Vitals/ Pain       Pain Assessment: No/denies pain  Home Living                                          Prior Functioning/Environment              Frequency  Min 3X/week        Progress Toward Goals  OT Goals(current goals can now be found in the care plan section)  Progress towards OT goals: Progressing toward goals  Acute Rehab OT Goals Patient Stated Goal: get back to music OT Goal Formulation: With patient Time For Goal Achievement: 01/07/17 Potential to Achieve Goals: [place] Frequency remains appropriate;Discharge plan needs to be updated    Co-evaluation    PT/OT/SLP Co-Evaluation/Treatment: Yes Reason for Co-Treatment: Necessary to address cognition/behavior during functional activity   OT goals addressed during session: ADL's and self-care      AM-PAC PT "6 Clicks" Daily Activity     Outcome Measure   Help from another person eating meals?: A Little Help from another person taking care of personal grooming?: A Lot Help from another person toileting, which includes using toliet, bedpan, or urinal?: A Lot Help from another person bathing (including washing, rinsing, drying)?: A Lot Help from another person to put on and taking off regular upper body clothing?: A Lot Help from another person to put on and taking off regular lower body clothing?: Total 6 Click Score: 12    End of Session Equipment Utilized  During Treatment: Gait belt  OT Visit Diagnosis: Muscle weakness (generalized) (M62.81);Hemiplegia and hemiparesis;Other symptoms and signs involving cognitive function;Repeated falls (R29.6) Hemiplegia - Right/Left: Right Hemiplegia - dominant/non-dominant: Non-Dominant Hemiplegia - caused by: Unspecified   Activity Tolerance Patient tolerated treatment well   Patient Left in chair;with call bell/phone within reach;with chair alarm set   Nurse Communication Mobility status (Pt incontinent of urine, condom cath off)        Time: 1610-9604 OT Time Calculation (min): 28 min  Charges: OT General Charges $OT Visit: 1 Visit OT Treatments $Self Care/Home Management : 8-22 mins  Hulda Humphrey OTR/[place] 12/26/2016, 3:33 PM

## 2016-12-26 NOTE — Progress Notes (Signed)
Pt. Transferred to 4w room 13. IV is intact 4W will remove when necessary.

## 2016-12-26 NOTE — Progress Notes (Signed)
Physical Therapy Treatment Patient Details Name: Perry Cervantes MRN: [id number] DOB: 1950-12-17 Today's Date: 12/26/2016    History of Present Illness TORRIN CRIHFIELD JRis a 66 y.o.malewith medical history significant for chronic back pain, depression, hypertension, PTSD. Patient presented with complaints of multiple falls. Over the past 2 years he has been falling more frequently but reports since Saturday morning- 12/22/16, he is falling much more than usual. He describes trying to stand up and suddenly feeling like his right lower extremity had "gone to sleep", also with weakness in his right upper extremity. 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    PT Comments    Patient seen for activity progression and balance training. Patient's session focused on functional task performance as well as dynamic trunk control activities at EOB. Patient continues to require increased physical assist for all aspects of mobility but shows some improvements in function of RLE with transitional movements. CIR remains appropriate.   Follow Up Recommendations  CIR;Supervision/Assistance - 24 hour     Equipment Recommendations   (TBD)    Recommendations for Other Services Rehab consult     Precautions / Restrictions Precautions Precautions: Fall Restrictions Weight Bearing Restrictions: No    Mobility  Bed Mobility Overal bed mobility: Needs Assistance Bed Mobility: Supine to Sit     Supine to sit: Mod assist     General bed mobility comments: Patient able to bring LEs to EOB, moderate assist to elevate trunk to upright   Transfers Overall transfer level: Needs assistance   Transfers: Sit to/from Stand;Stand Pivot Transfers Sit to Stand: Mod assist Stand pivot transfers: Mod assist;+2 physical assistance       General transfer comment: Patient required knee blocking RLE and facilitation of weight shifts to advance RLE during pivotal movements to chair  Ambulation/Gait                 Stairs            Wheelchair Mobility    Modified Rankin (Stroke Patients Only) Modified Rankin (Stroke Patients Only) Pre-Morbid Rankin Score: No symptoms Modified Rankin: Moderately severe disability     Balance Overall balance assessment: Needs assistance Sitting-balance support: Feet supported;Single extremity supported Sitting balance-Leahy Scale: Fair Sitting balance - Comments: pt tolerated sitting x 10 min, pt with strong posterior lean with LE LAQ requiring assist to come back to midline   Standing balance support: During functional activity;Single extremity supported Standing balance-Leahy Scale: Poor Standing balance comment: Significant reliance on therapist assist                            Cognition Arousal/Alertness: Awake/alert Behavior During Therapy: Impulsive Overall Cognitive Status: Impaired/Different from baseline                     Current Attention Level: Sustained   Following Commands: Follows one step commands consistently (but easily distracted) Safety/Judgement: Decreased awareness of safety;Decreased awareness of deficits   Problem Solving: Slow processing;Difficulty sequencing;Requires verbal cues;Requires tactile cues General Comments: patient with poor attention, restlessness, imulsivity and poor ability to sustain task performance. Very easily distracted and tangential during session      Exercises Other Exercises Other Exercises: session focused on EOB dynamic balance activities. Reaching forward x5, trunk flexion and repositioning x5, trunk extension with midline facilitation x5. Patient easily distracted with poor ability to maintain mid line Other Exercises: lateral lean (left elbow for midline faciliataton 5  secon holds x3) Other Exercises: Pre gait static standing prior to transfer to chair    General Comments        Pertinent Vitals/Pain Pain Assessment: No/denies pain    Home  Living                      Prior Function            PT Goals (current goals can now be found in the care plan section) Acute Rehab PT Goals Patient Stated Goal: get back to music PT Goal Formulation: With patient Time For Goal Achievement: 01/08/17 Potential to Achieve Goals: Good    Frequency    Min 4X/week      PT Plan Current plan remains appropriate    Co-evaluation              AM-PAC PT "6 Clicks" Daily Activity  Outcome Measure  Difficulty turning over in bed (including adjusting bedclothes, sheets and blankets)?: Unable Difficulty moving from lying on back to sitting on the side of the bed? : Unable Difficulty sitting down on and standing up from a chair with arms (e.g., wheelchair, bedside commode, etc,.)?: Unable Help needed moving to and from a bed to chair (including a wheelchair)?: A Lot Help needed walking in hospital room?: Total Help needed climbing 3-5 steps with a railing? : Total 6 Click Score: 7    End of Session Equipment Utilized During Treatment: Gait belt Activity Tolerance: Patient tolerated treatment well Patient left: in chair;with call bell/phone within reach;with chair alarm set;with nursing/sitter in room Nurse Communication: Mobility status PT Visit Diagnosis: Difficulty in walking, not elsewhere classified (R26.2);Hemiplegia and hemiparesis Hemiplegia - Right/Left: Right Hemiplegia - dominant/non-dominant: Non-dominant Hemiplegia - caused by: Cerebral infarction     Time: 4098-1191 PT Time Calculation (min) (ACUTE ONLY): 27 min  Charges:  $Therapeutic Activity: 8-22 mins                    G Codes:       Alben Deeds, PT DPT  Board Certified Neurologic Specialist [place] 12/26/2016, 1:42 PM

## 2016-12-27 ENCOUNTER — Inpatient Hospital Stay (HOSPITAL_COMMUNITY): Payer: Medicare Other | Admitting: Occupational Therapy

## 2016-12-27 ENCOUNTER — Inpatient Hospital Stay (HOSPITAL_COMMUNITY): Payer: Medicare Other

## 2016-12-27 ENCOUNTER — Telehealth (HOSPITAL_COMMUNITY): Payer: Self-pay | Admitting: *Deleted

## 2016-12-27 DIAGNOSIS — I6330 Cerebral infarction due to thrombosis of unspecified cerebral artery: Secondary | ICD-10-CM

## 2016-12-27 DIAGNOSIS — G8191 Hemiplegia, unspecified affecting right dominant side: Secondary | ICD-10-CM

## 2016-12-27 DIAGNOSIS — F4310 Post-traumatic stress disorder, unspecified: Secondary | ICD-10-CM

## 2016-12-27 LAB — CBC WITH DIFFERENTIAL/PLATELET
Basophils Absolute: 0 10*3/uL (ref 0.0–0.1)
Basophils Relative: 1 %
EOS PCT: 4 %
Eosinophils Absolute: 0.3 10*3/uL (ref 0.0–0.7)
HCT: 36.5 % — ABNORMAL LOW (ref 39.0–52.0)
HEMOGLOBIN: 12.3 g/dL — AB (ref 13.0–17.0)
LYMPHS ABS: 1.2 10*3/uL (ref 0.7–4.0)
LYMPHS PCT: 17 %
MCH: 33.8 pg (ref 26.0–34.0)
MCHC: 33.7 g/dL (ref 30.0–36.0)
MCV: 100.3 fL — AB (ref 78.0–100.0)
MONOS PCT: 8 %
Monocytes Absolute: 0.6 10*3/uL (ref 0.1–1.0)
Neutro Abs: 5 10*3/uL (ref 1.7–7.7)
Neutrophils Relative %: 70 %
PLATELETS: 176 10*3/uL (ref 150–400)
RBC: 3.64 MIL/uL — AB (ref 4.22–5.81)
RDW: 13.6 % (ref 11.5–15.5)
WBC: 7.1 10*3/uL (ref 4.0–10.5)

## 2016-12-27 LAB — COMPREHENSIVE METABOLIC PANEL
ALK PHOS: 68 U/L (ref 38–126)
ALT: 36 U/L (ref 17–63)
ANION GAP: 8 (ref 5–15)
AST: 31 U/L (ref 15–41)
Albumin: 2.9 g/dL — ABNORMAL LOW (ref 3.5–5.0)
BUN: 16 mg/dL (ref 6–20)
CALCIUM: 8.6 mg/dL — AB (ref 8.9–10.3)
CO2: 25 mmol/L (ref 22–32)
Chloride: 105 mmol/L (ref 101–111)
Creatinine, Ser: 0.99 mg/dL (ref 0.61–1.24)
Glucose, Bld: 107 mg/dL — ABNORMAL HIGH (ref 65–99)
Potassium: 3.9 mmol/L (ref 3.5–5.1)
SODIUM: 138 mmol/L (ref 135–145)
Total Bilirubin: 0.7 mg/dL (ref 0.3–1.2)
Total Protein: 5.3 g/dL — ABNORMAL LOW (ref 6.5–8.1)

## 2016-12-27 MED ORDER — DIAZEPAM 5 MG PO TABS
10.0000 mg | ORAL_TABLET | Freq: Every day | ORAL | Status: DC
  Administered 2016-12-27 – 2017-01-14 (×18): 10 mg via ORAL
  Filled 2016-12-27 (×19): qty 2

## 2016-12-27 MED ORDER — ACETAMINOPHEN 325 MG PO TABS
650.0000 mg | ORAL_TABLET | Freq: Four times a day (QID) | ORAL | Status: DC | PRN
  Administered 2016-12-27 – 2017-01-12 (×15): 650 mg via ORAL
  Filled 2016-12-27 (×15): qty 2

## 2016-12-27 MED ORDER — DIAZEPAM 5 MG PO TABS
10.0000 mg | ORAL_TABLET | Freq: Two times a day (BID) | ORAL | Status: DC
  Administered 2016-12-28 – 2017-01-15 (×38): 10 mg via ORAL
  Filled 2016-12-27 (×38): qty 2

## 2016-12-27 NOTE — Telephone Encounter (Signed)
We can reschedule him after he gets out of the rehab unit

## 2016-12-27 NOTE — Evaluation (Signed)
Occupational Therapy Assessment and Plan  Patient Details  Name: Perry Cervantes MRN: [id number] Date of Birth: 10-01-1950  OT Diagnosis: abnormal posture, cognitive deficits, hemiplegia affecting dominant side, muscle weakness (generalized) and coordination disorder Rehab Potential: Rehab Potential (ACUTE ONLY): Good ELOS: 21 days   Today's Date: 12/27/2016 OT Individual Time: 0240-9735 and 1300-1330 OT Individual Time Calculation (min): 75 min and 30 min     Problem List:  Patient Active Problem List   Diagnosis Date Noted  . 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 Right hemiparesis ([place])   . 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 Neuropathic pain   . Adjustment disorder with mixed anxiety and depressed mood   . Benign essential HTN   . Hyperlipidemia   . Left bundle branch block   . Leukocytosis   . Tobacco abuse   . CVA (cerebral vascular accident) ([place]) 12/24/2016  . PTSD (post-traumatic stress disorder) 12/23/2016  . Depression 12/23/2016  . Chronic back pain 12/23/2016  . Right sided weakness 12/23/2016  . AKI (acute kidney injury) ([place]) 12/23/2016  . Rhabdomyolysis 12/23/2016  . Lumbar scoliosis 05/14/2013  . Heme positive stool 03/24/2012  . GERD (gastroesophageal reflux disease) 03/24/2012    Past Medical History:  Past Medical History:  Diagnosis Date  . Anxiety   . Chronic back pain    Spondylolisthesis, spondylosis, scoliosis, and radiculopathy  . Fibromyalgia   . Gastroesophageal reflux disease   . History of colon polyps   . History of kidney stones   . Hypertension   . Mild depression ([place])   . PTSD (post-traumatic stress disorder)   . Spondylolysis of lumbar region    Past Surgical History:  Past Surgical History:  Procedure Laterality Date  . BACK SURGERY     x 2  . COLONOSCOPY  03/13/2005   RMR: Normal rectum. Diminutive polyp at hepatic flexure cold biopsied/removed  (inflamed, benign). Otherwise normal colon, normal terminal ileum  . COLONOSCOPY N/A 07/28/2012   RMR: Colonic diverticulosis. diminutive colonic polyps (hyerplastic)-removed as described above. Colonic erosion ulceration ad described above. Status post biopsy. I suspect NSAID insult to his colon rather than inflammatory bowel disease . These finding could easily explain Hemocult-positve stool. EGD not needed at this time. Next TCS 07/2022.  . ESOPHAGOGASTRODUODENOSCOPY  03/13/2005   RMR: Small hiatal hernia  . NECK SURGERY  2008   fusion  . Right knee arthroscopy     x 2    Assessment & Plan Clinical Impression: Patient is a 66 y.o. year old male withhistory of chronic back pain, hypertension, PTSD and tobacco abuse.Per chart review patient lives with wife who is retired. Patient uses occasional cane prior to admission. 2 level with bedroom on main level.Wife with a recent fall and can provide limited physical assistance. Presented 12/23/2016 with multiple falls and right-sided weakness. Cranial CT reviewed, negative for acute process. CT cervical spine showed moderate central disc protrusion at C4-5. C5-C7 anterior fusion. 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 Echocardiogram with ejection fraction of 65% no wall motion abnormalities. Patient did not receive TPA. Carotid Dopplers had no ICA stenosis. White blood cell count elevated 16,200 with CK elevated 11,003 as well as tachycardia.EKG Left BBB Troponin negative with follow-up per cardiology services and no further plan for cardiac workup. MRA of the brain no anterior circulation stenosis or branch occlusion noted.. Urine drug screen positive for benzos. CK  trending downward 2734. Neurology consulted placed on aspirin for CVA prophylaxis. Subcutaneous heparin for DVT prophylaxis.A follow-up chest x-ray completed showing low lung volume with new linear opacity in the left infrahilar region potentially representing  atelectasis though difficult to exclude atypical infection. Patient was placed on Augmentin for empiric coverage. Physical therapy evaluation completed 12/25/2016 with recommendations of physical medicine rehabilitation consult.Patient was admitted for a comprehensive rehabilitation program. Patient transferred to CIR on 12/26/2016 .    Patient currently requires max with basic self-care skills secondary to muscle weakness, decreased cardiorespiratoy endurance, abnormal tone, decreased coordination and decreased motor planning, decreased awareness, decreased problem solving, decreased safety awareness and delayed processing and decreased sitting balance, decreased standing balance, decreased postural control, hemiplegia and decreased balance strategies.  Prior to hospitalization, patient could complete ADLs and IADLs  independent .  Patient will benefit from skilled intervention to decrease level of assist with basic self-care skills prior to discharge SNF.  Anticipate patient will require 24 hour supervision and minimal physical assistance and SNF rehab.  OT - End of Session Activity Tolerance: Decreased this session Endurance Deficit: Yes Endurance Deficit Description: pt very fatigued at end of session, despite multiple rest breaks OT Assessment Rehab Potential (ACUTE ONLY): Good OT Barriers to Discharge: Decreased caregiver support;Lack of/limited family support OT Barriers to Discharge Comments: wife recently discharged from hospital and unable to provide assistance OT Patient demonstrates impairments in the following area(s): Balance;Behavior;Cognition;Endurance;Motor;Pain;Safety OT Basic ADL's Functional Problem(s): Grooming;Bathing;Dressing;Toileting OT Transfers Functional Problem(s): Toilet;Tub/Shower OT Additional Impairment(s): Fuctional Use of Upper Extremity OT Plan OT Intensity: Minimum of 1-2 x/day, 45 to 90 minutes OT Frequency: 5 out of 7 days OT Duration/Estimated Length of  Stay: 21 days OT Treatment/Interventions: Balance/vestibular training;Cognitive remediation/compensation;Community reintegration;Discharge planning;DME/adaptive equipment instruction;Functional mobility training;Neuromuscular re-education;Pain management;Patient/family education;Psychosocial support;Therapeutic Activities;UE/LE Coordination activities;UE/LE Strength taining/ROM;Self Care/advanced ADL retraining;Therapeutic Exercise;Wheelchair propulsion/positioning OT Self Feeding Anticipated Outcome(s): set up/S OT Basic Self-Care Anticipated Outcome(s): S- min A OT Toileting Anticipated Outcome(s): min A OT Bathroom Transfers Anticipated Outcome(s): min A OT Recommendation Recommendations for Other Services: Neuropsych consult Patient destination: [place] (SNF) Follow Up Recommendations: 24 hour supervision/assistance;Skilled nursing facility Equipment Recommended: To be determined   Skilled Therapeutic Intervention Session 1: Upon entering the room, pt supine in bed and agreeable to OT intervention. RN arriving to give pt medications while seated on EOB with min A for static sitting balance. Pt ambulating 5' without use of AD and R knee blocking and max A. Pt transferred into wheelchair with max A for lifting and lowering assistance. Pt with multiple cues for safety awareness with R UE. Pt transferred onto toilet with max A onto drop arm commode chair. Pt able to perform hygiene while seated but required assistance for clothing management. Pt returned to wheelchair and seated at sink for grooming tasks with set up A. OT assisted pt in washing hair at sink as well. Pt donning clothing items with mod cues for hemiplegic dressing technique. Pt remained in wheelchair at end of session with quick release belt donned and call bell within reach.   Session 2: Upon entering the room, pt supine in bed. Pt reports being incontinent prior to therapist arrival. Supine >sit with mod A to EOB.  Pt donning pants with assistance to cross ankle to opposite knee. Pt required max A for sitting balance with this task. Pt transferred into wheelchair with max A stand pivot transfer. OT educated and demonstrated hemiplegic technique to propel wheelchair with supervision 100'. OT assisted him back to  room with quick release belt donned and call bell within reach.   OT Evaluation Precautions/Restrictions  Precautions Precautions: Fall Restrictions Weight Bearing Restrictions: No Pain Pain Assessment Pain Assessment: No/denies pain Home Living/Prior Functioning Home Living Family/patient expects to be discharged to:: Private residence Living Arrangements: Spouse/significant other Available Help at Discharge: Family, Available 24 hours/day (wife cannot physically assist) Type of Home: House Home Access: Stairs to enter Technical brewer of Steps: 3 Entrance Stairs-Rails: None Home Layout: Two level, Able to live on main level with bedroom/bathroom Bathroom Shower/Tub: Chiropodist: Standard  Lives With: Spouse IADL History Leisure and Hobbies: Writing music, planting a garden, being around people  IADL Comments: Pt reports being caregiver for his wife. She was just discharged from hospital in the last week per pt. Prior Function Vocation: Full time employment Vocation Requirements: musician Comments: hx of many falls Vision Baseline Vision/History: Wears glasses Wears Glasses: Reading only Patient Visual Report: No change from baseline Vision Assessment?: No apparent visual deficits Perception    Praxis   Cognition Overall Cognitive Status: Impaired/Different from baseline Arousal/Alertness: Awake/alert Orientation Level: Person;Place;Situation Person: Oriented Place: Oriented Situation: Oriented Year: 2018 Month: September Day of Week: Correct Memory Impairment: Decreased recall of new information Immediate Memory Recall: Sock;Blue;Bed Memory  Recall: Sock;Blue;Bed Memory Recall Sock: Without Cue Memory Recall Blue: With Cue Memory Recall Bed: With Cue Awareness: Impaired Behaviors: Impulsive Safety/Judgment: Impaired Sensation Sensation Light Touch: Appears Intact Proprioception: Appears Intact Coordination Gross Motor Movements are Fluid and Coordinated: No Fine Motor Movements are Fluid and Coordinated: No Heel Shin Test: NT Motor  Motor Motor: Abnormal tone;Hemiplegia Motor - Skilled Clinical Observations: hypertonic R PFs Mobility  Bed Mobility Bed Mobility: Rolling Right;Rolling Left;Left Sidelying to Sit Rolling Right: 4: Min guard Rolling Left: 3: Mod assist Rolling Left Details: Manual facilitation for placement;Manual facilitation for weight bearing;Verbal cues for sequencing;Verbal cues for technique Left Sidelying to Sit: 3: Mod assist Left Sidelying to Sit Details: Manual facilitation for placement;Manual facilitation for weight bearing;Manual facilitation for weight shifting;Verbal cues for sequencing;Verbal cues for technique  Trunk/Postural Assessment  Cervical Assessment Cervical Assessment: Within Functional Limits Thoracic Assessment Thoracic Assessment: Exceptions to [facility] (kyphotic) Lumbar Assessment Lumbar Assessment: Exceptions to [facility] Postural Control Postural Control: Deficits on evaluation Righting Reactions: in sitting, L lean, without awareness; when questioned, he was able to shift R slightly  Balance Balance Balance Assessed: Yes Static Sitting Balance Static Sitting - Level of Assistance: 5: Stand by assistance Dynamic Sitting Balance Dynamic Sitting - Level of Assistance: 2: Max assist Sitting balance - Comments: pt leaned L slightly, had LOB withut demonstrating any trunk righting; laid back on back Static Standing Balance Static Standing - Level of Assistance: 2: Max assist Dynamic Standing Balance Dynamic Standing - Level of Assistance: 1: +1 Total assist Extremity/Trunk  Assessment RUE Assessment RUE Assessment: Exceptions to [facility] (flaccid 0/5) LUE Assessment LUE Assessment: Within Functional Limits   See Function Navigator for Current Functional Status.   Refer to Care Plan for Long Term Goals  Recommendations for other services: Neuropsych   Discharge Criteria: Patient will be discharged from OT if patient refuses treatment 3 consecutive times without medical reason, if treatment goals not met, if there is a change in medical status, if patient makes no progress towards goals or if patient is discharged from hospital.  The above assessment, treatment plan, treatment alternatives and goals were discussed and mutually agreed upon: by patient  Gypsy Decant 12/27/2016, 1:34 PM

## 2016-12-27 NOTE — Evaluation (Addendum)
Physical Therapy Assessment and Plan  Patient Details  Name: Perry Cervantes MRN: [id number] Date of Birth: Oct 11, 1950  PT Diagnosis: Abnormal posture, Abnormality of gait, Cognitive deficits, Hemiparesis dominant, Hypertonia and Low back pain Rehab Potential: Good ELOS: 18-21 days   Today's Date: 12/27/2016 PT Individual Time: 1400-1530 PT Individual Time Calculation (min): 90 min    Problem List:  Patient Active Problem List   Diagnosis Date Noted  . 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 Right hemiparesis ([place])   . 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 Neuropathic pain   . Adjustment disorder with mixed anxiety and depressed mood   . Benign essential HTN   . Hyperlipidemia   . Left bundle branch block   . Leukocytosis   . Tobacco abuse   . CVA (cerebral vascular accident) ([place]) 12/24/2016  . PTSD (post-traumatic stress disorder) 12/23/2016  . Depression 12/23/2016  . Chronic back pain 12/23/2016  . Right sided weakness 12/23/2016  . AKI (acute kidney injury) ([place]) 12/23/2016  . Rhabdomyolysis 12/23/2016  . Lumbar scoliosis 05/14/2013  . Heme positive stool 03/24/2012  . GERD (gastroesophageal reflux disease) 03/24/2012    Past Medical History:  Past Medical History:  Diagnosis Date  . Anxiety   . Chronic back pain    Spondylolisthesis, spondylosis, scoliosis, and radiculopathy  . Fibromyalgia   . Gastroesophageal reflux disease   . History of colon polyps   . History of kidney stones   . Hypertension   . Mild depression ([place])   . PTSD (post-traumatic stress disorder)   . Spondylolysis of lumbar region    Past Surgical History:  Past Surgical History:  Procedure Laterality Date  . BACK SURGERY     x 2  . COLONOSCOPY  03/13/2005   RMR: Normal rectum. Diminutive polyp at hepatic flexure cold biopsied/removed (inflamed, benign). Otherwise normal colon, normal terminal ileum  .  COLONOSCOPY N/A 07/28/2012   RMR: Colonic diverticulosis. diminutive colonic polyps (hyerplastic)-removed as described above. Colonic erosion ulceration ad described above. Status post biopsy. I suspect NSAID insult to his colon rather than inflammatory bowel disease . These finding could easily explain Hemocult-positve stool. EGD not needed at this time. Next TCS 07/2022.  . ESOPHAGOGASTRODUODENOSCOPY  03/13/2005   RMR: Small hiatal hernia  . NECK SURGERY  2008   fusion  . Right knee arthroscopy     x 2    Assessment & Plan Perry LACIVITA JRis a 66 y.o.right handed malewith history of chronic back pain, hypertension, PTSD and tobacco abuse.Per chart review patient lives with wife who is retired. Patient uses occasional cane prior to admission. 2 level with bedroom on main level.Wife with a recent fall and can provide limited physical assistance. Presented 12/23/2016 with multiple falls and right-sided weakness. Cranial CT reviewed, negative for acute process. CT cervical spine showed moderate central disc protrusion at C4-5. C5-C7 anterior fusion. 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 Echocardiogram with ejection fraction of 65% no wall motion abnormalities. Patient did not receive TPA. Carotid Dopplers had no ICA stenosis. White blood cell count elevated 16,200 with CK elevated 11,003 as well as tachycardia.EKG Left BBB Troponin negative with follow-up per cardiology services and no further plan for cardiac workup. MRA of the brain no anterior circulation stenosis or branch occlusion noted.. Urine drug screen positive for benzos. CK trending downward 2734. Neurology consulted placed on aspirin for CVA prophylaxis.  Subcutaneous heparin for DVT prophylaxis.A follow-up chest x-ray completed showing low lung volume with new linear opacity in the left infrahilar region potentially representing atelectasis though difficult to exclude atypical infection. Patient was  placed on Augmentin for empiric coverage.    Patient transferred to CIR on 12/26/2016 .   Clinical Impression: Patient currently requires max with mobility secondary to decreased cardiorespiratoy endurance, impaired timing and sequencing and abnormal tone, decreased attention, decreased awareness, decreased problem solving, decreased safety awareness and decreased memory and decreased sitting balance, decreased standing balance, decreased postural control, hemiplegia and decreased balance strategies.  Prior to hospitalization, patient was modified independent  with mobility and lived with Spouse in a House home.  Home access is 3Stairs to enter, no rail.  Alternate stairs are 14 to enter home, with 2 rails, but stairs are very narrow.  Pt stated he might be able to get someone to put in 1 rail at the front door with 3 steps.   Patient will benefit from skilled PT intervention to maximize safe functional mobility, minimize fall risk and decrease caregiver burden for planned discharge home with 24 hour supervision, min assist to enter house.  Anticipate patient will benefit from follow up [place] at discharge.  PT - End of Session Activity Tolerance: Tolerates 10 - 20 min activity with multiple rests Endurance Deficit: Yes Endurance Deficit Description: pt very fatigued at end of session, despite multiple rest breaks PT Assessment Rehab Potential (ACUTE/IP ONLY): Good PT Barriers to Discharge: Inaccessible home environment;Decreased caregiver support;Lack of/limited family support PT Barriers to Discharge Comments: pt's wife was recently hospitalized here at [facility], after a fall with head injury, per pt. PT Patient demonstrates impairments in the following area(s): Balance;Behavior;Endurance;Motor;Safety PT Transfers Functional Problem(s): Bed Mobility;Bed to Chair;Car;Furniture PT Locomotion Functional Problem(s): Ambulation;Wheelchair Mobility;Stairs PT Plan PT Intensity: Minimum of 1-2 x/day ,45 to 90  minutes PT Frequency: 5 out of 7 days PT Duration Estimated Length of Stay: 18-21 days PT Treatment/Interventions: Ambulation/gait training;Balance/vestibular training;Cognitive remediation/compensation;Discharge planning;Community reintegration;DME/adaptive equipment instruction;Functional electrical stimulation;Functional mobility training;Patient/family education;Pain management;Neuromuscular re-education;Psychosocial support;Splinting/orthotics;Therapeutic Exercise;Therapeutic Activities;Stair training;UE/LE Strength taining/ROM;UE/LE Coordination activities;Wheelchair propulsion/positioning PT Transfers Anticipated Outcome(s): supervision basic, furniture, car PT Locomotion Anticipated Outcome(s): supervision gait x 50' home env, 150' controlled env, min assist up/down 4 steps 1 rail PT Recommendation Recommendations for Other Services: Neuropsych consult;Therapeutic Recreation consult Therapeutic Recreation Interventions: Pet therapy;Stress management Follow Up Recommendations: Home health PT Patient destination: Home Equipment Recommended: To be determined Equipment Details: pt owns RW and SPC.  Pt used SPC occasionally, PTA  Skilled Therapeutic Intervention Pt obviously unhappy, c/o about his adult children not helping out his wife, who recently d/'d home from [facility].  Pt was verbose, impulsive, self-distracting and had difficulty staying on task at times.  Pt has poor awareness of his deficits from CVA, and initally stated he was here because of a fall in which he injured his R arm.  ELOS, LTGS and direction of PT explained to pt.  He participated well with frequent redirection.  Neuromuscular re-education via forced use, visual and VCs for RLE mass flex/extension in sitting , using Kinetron at level 40 x 25 cycles x 4, for alternating LE movement pattern.  In standing and gait assessment, pt tended to hyperextend R knee.  By end of session, pt was tearful about the situation of his wife right  now, and stated: "if is our time to die, I would rather be at home with her".  PT provided emotional support to pt, and consulted with  Jacqlyn Larsen, [place] regarding pt's home situation.  Pt transferred back to bed, squat pivot to R, with mod assist.  Pt left resting in bed with alarm set, all needs within reach.   PT Evaluation Precautions/Restrictions Precautions Precautions: Fall Precaution Comments: PTSD, verbose, angry at adult children who aren't helping his wife Restrictions Weight Bearing Restrictions: No General   Vital Signs  Pain Pain Assessment Pain Assessment: No/denies pain Home Living/Prior Functioning Home Living Living Arrangements: Spouse/significant other Available Help at Discharge: Family;Available 24 hours/day (wife cannot physically assist) Type of Home: House Home Access: Stairs to enter [employer] of Steps: 3 Entrance Stairs-Rails: None Home Layout: Two level;Able to live on main level with bedroom/bathroom Alternate Level Stairs-Number of Steps: 14 to kitchen, narrow Alternate Level Stairs-Rails: Can reach both Bathroom Shower/Tub: Chiropodist: Standard  Lives With: Spouse Prior Function Level of Independence: Independent with gait  Able to Take Stairs?: Yes Driving: Yes Vocation: Full time employment Vocation Requirements: musician Comments: hx of many falls Vision/Perception - wears reading glasses, no changes, per pt; pt demonstrates R visual inattention    Cognition Overall Cognitive Status: Impaired/Different from baseline Arousal/Alertness: Awake/alert Orientation Level: Oriented to person;Oriented to place;Disoriented to person;Oriented to time Awareness: Impaired Behaviors: Impulsive Safety/Judgment: Impaired Sensation Sensation Light Touch: Appears Intact Proprioception: Appears Intact Coordination Gross Motor Movements are Fluid and Coordinated: No Fine Motor Movements are Fluid and Coordinated: No Heel Shin  Test: NT Motor  Motor Motor: Abnormal tone Motor - Skilled Clinical Observations: hypertonic R PFs  Mobility Bed Mobility Bed Mobility: Rolling Right;Rolling Left;Left Sidelying to Sit Rolling Right: 4: Min guard Rolling Left: 3: Mod assist Rolling Left Details: Manual facilitation for placement;Manual facilitation for weight bearing;Verbal cues for sequencing;Verbal cues for technique Left Sidelying to Sit: 3: Mod assist Left Sidelying to Sit Details: Manual facilitation for placement;Manual facilitation for weight bearing;Manual facilitation for weight shifting;Verbal cues for sequencing;Verbal cues for technique Transfers Transfers: Yes Squat Pivot Transfers: 3: Mod assist Squat Pivot Transfer Details: Manual facilitation for placement;Manual facilitation for weight shifting;Verbal cues for sequencing;Verbal cues for technique Locomotion  Ambulation Ambulation: Yes Ambulation/Gait Assistance: 2: Max assist Ambulation Distance (Feet): 3 Feet Assistive device: None Ambulation/Gait Assistance Details: Manual facilitation for weight bearing;Manual facilitation for weight shifting;Manual facilitation for placement Gait Gait: Yes Gait Pattern: Impaired Gait Pattern: Decreased dorsiflexion - right;Decreased hip/knee flexion - right;Decreased trunk rotation;Lateral trunk lean to right;Right flexed knee in stance;Right genu recurvatum;Step-through pattern;Narrow base of support;Trunk rotated posteriorly on right Stairs / Additional Locomotion Stairs: Yes Stairs Assistance: 2: Max assist Stair Management Technique: One rail Left;Step to pattern;Backwards;Forwards Number of Stairs: 1 Height of Stairs: 4 Ramp: Not tested (comment) Curb: Not tested (comment) Wheelchair Mobility Wheelchair Mobility: Yes Wheelchair Assistance: 4: Advertising account executive Details: Verbal cues for Marketing executive: Left upper extremity;Left lower extremity Wheelchair Parts  Management: Needs assistance Distance: 150  Trunk/Postural Assessment  Cervical Assessment Cervical Assessment: Within Functional Limits Thoracic Assessment Thoracic Assessment: Exceptions to [facility] (kyphotic) Lumbar Assessment Lumbar Assessment: Exceptions to [facility] (sacral sitting, increased wt bearing L) Postural Control Postural Control: Deficits on evaluation Righting Reactions: in sitting, L lean, without awareness; when questioned, he was able to shift R slightly  Balance Balance Balance Assessed: Yes Static Sitting Balance Static Sitting - Level of Assistance: 5: Stand by assistance Dynamic Sitting Balance Dynamic Sitting - Level of Assistance: 2: Max assist Sitting balance - Comments: pt leaned L slightly, had LOB withut demonstrating any trunk righting; laid back on back Static  Standing Balance Static Standing - Level of Assistance: 2: Max assist Dynamic Standing Balance Dynamic Standing - Level of Assistance: 1: +1 Total assist Extremity Assessment      RLE Assessment RLE Assessment: Exceptions to [facility] RLE Strength RLE Overall Strength Comments: grossly in sitting, hip flex 2+/5, knee ext 3-/5, 0/5 ankle DF, 2-/5 ankle PF RLE Tone RLE Tone Comments: hypertonic L ankle DF LLE Assessment LLE Assessment: Within Functional Limits   See Function Navigator for Current Functional Status.   Refer to Care Plan for Long Term Goals  Recommendations for other services: Neuropsych and Therapeutic Recreation  Pet therapy and Stress management  Discharge Criteria: Patient will be discharged from PT if patient refuses treatment 3 consecutive times without medical reason, if treatment goals not met, if there is a change in medical status, if patient makes no progress towards goals or if patient is discharged from hospital.  The above assessment, treatment plan, treatment alternatives and goals were discussed and mutually agreed upon: by patient  Caprice Mccaffrey 12/27/2016, 4:10 PM

## 2016-12-27 NOTE — Progress Notes (Signed)
Patient information reviewed and entered into eRehab system by Jonta Gastineau, RN, CRRN, PPS Coordinator.  Information including medical coding and functional independence measure will be reviewed and updated through discharge.     Per nursing patient was given "Data Collection Information Summary for Patients in Inpatient Rehabilitation Facilities with attached "Privacy Act Statement-Health Care Records" upon admission.  

## 2016-12-27 NOTE — Progress Notes (Addendum)
[facility] PHYSICAL MEDICINE & REHABILITATION     PROGRESS NOTE    Subjective/Complaints: States he had a tough night. I asked him why and he said because of a fever and then went on a rant about his PTSD and meds weren'Cervantes right, etc  ROS: pt denies nausea, vomiting, diarrhea, cough, shortness of breath or chest pain   Objective: Vital Signs: Blood pressure 100/62, pulse 65, temperature 98 F (36.7 C), temperature source Oral, resp. rate 16, height 5\' 9"  (1.753 m), weight 79.4 kg (175 lb 0.7 oz), SpO2 98 %. Perry Cervantes  Result Date: 12/26/2016 CLINICAL DATA:  Stroke follow-up. EXAM: MRA HEAD WITHOUT Cervantes TECHNIQUE: Angiographic images of the Circle of Willis were obtained using MRA technique without intravenous Cervantes. COMPARISON:  Brain MRI from yesterday FINDINGS: Symmetric carotid arteries. Dominant right vertebral artery. Intact circle-of-Willis. No flow seen within the left vertebral artery before the patent PICA, likely occlusion with retrograde flow via the basilar. Retrograde flow in the non-opacified segment is considered unlikely given the robust signal in the distal V4 segment and PICA. The other vessels are smooth and widely patent. There is a small inferior bulge from the mid cavernous left ICA, suspect an infundibulum due to shape and location, ~1.5 mm. No evidence of intradural aneurysm. IMPRESSION: 1. No detected flow in the non dominant proximal left V4 segment with presumed retrograde filling of the patent left PICA. Widely patent left vertebral and basilar arteries. 2. No anterior circulation stenosis or branch occlusion noted. 3. 1-2 mm left mid cavernous ICA bulge, favor infundibulum. No evidence of intradural aneurysm. Electronically Signed   By: Monte Fantasia M.D.   On: 12/26/2016 10:08   Dg Chest Port 1 View  Result Date: 12/26/2016 CLINICAL DATA:  66 year old male with a history of cough and hypertension EXAM: PORTABLE CHEST 1 VIEW COMPARISON:   05/07/2013, 04/26/2006 FINDINGS: Cardiomediastinal silhouette unchanged in size and contour. No evidence of central vascular congestion. Lung volumes are low with crowding of the interstitium. Linear opacity in the left infrahilar region. No large pleural effusion. No pneumothorax. Surgical changes of ACDF, incompletely imaged. New eggshell calcification in the left axillary region. IMPRESSION: Low lung volumes with new linear opacity in the left infrahilar region, potentially representing atelectasis though difficult to exclude atypical infection. New eggshell calcifications in the left axillary soft tissues, potentially related to lymph nodes. If there is concern for lymphoproliferative disorder, correlation with lab values may be useful. Electronically Signed   By: Corrie Mckusick D.O.   On: 12/26/2016 08:32    Recent Labs  12/26/16 0527 12/27/16 0550  WBC 14.2* 7.1  HGB 12.3* 12.3*  HCT 36.7* 36.5*  PLT 171 176    Recent Labs  12/26/16 0527 12/27/16 0550  NA 137 138  K 4.2 3.9  CL 108 105  GLUCOSE 104* 107*  BUN 17 16  CREATININE 1.02 0.99  CALCIUM 8.2* 8.6*   CBG (last 3)  No results for input(s): GLUCAP in the last 72 hours.  Wt Readings from Last 3 Encounters:  12/26/16 79.4 kg (175 lb 0.7 oz)  12/23/16 79.4 kg (175 lb)  12/22/16 79.4 kg (175 lb)    Physical Exam:  Constitutional: He is oriented to person, place, and time. He appears well-developed and well-nourished.  HENT:  Head: Normocephalic and atraumatic.  Eyes: EOM are normal. Right eye exhibits no discharge. Left eye exhibits no discharge.  Neck: supple.  Cardiovascular: RRR without murmur. No JVD .   Respiratory:  CTA Bilaterally without wheezes or rales. Normal effort  GI: Soft. Bowel sounds are normal. He exhibits no distension.  Musculoskeletal: He exhibits no edema or tenderness.  Neurological: He is alert and oriented to person, place, and time.  Motor: RUE; 0/5 proximal to distal--no change RLE: HF, KE  3+/5, ADF/PF 0/5--essentially unchanged LUE/LLE: 5/5 proximal to distal DTRs 3+ RUE/RLE Tangential, perseverative behviors  Skin: Skin is warm and dry.  Scattered abrasions  Psychiatric: impulsive and irritable   Assessment/Plan: 1. 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 Physiatrist is providing close team supervision and 24 hour management of active medical problems listed below. Physiatrist and rehab team continue to assess barriers to discharge/monitor patient progress toward functional and medical goals.  Function:  Bathing Bathing position      Bathing parts      Bathing assist        Upper Body Dressing/Undressing Upper body dressing                    Upper body assist        Lower Body Dressing/Undressing Lower body dressing                                  Lower body assist        Toileting Toileting          Toileting assist     Transfers Chair/bed transfer             Locomotion Ambulation           Wheelchair          Cognition Comprehension    Expression    Social Interaction    Problem Solving    Memory     Medical Problem List and Plan: 1.  Right hemiparesis secondary to left corona radiata infarct  -begin therapies 2.  DVT Prophylaxis/Anticoagulation: Subcutaneous heparin. Monitor platelet counts and any signs of bleeding 3. Pain Management/chronic back pain: Neurontin 300 mg 3 times a day, hydrocodone as needed 4. Mood/PTSD: Lexapro 20 mg daily, Seroquel 100 mg daily, Valium 10 mg---change closer to home regimen of 10-10-20mg ---10mg  in am/pm/hs, add to hs dose if needed  -neuropsych assessment for behavior mgt considerations 5. Neuropsych: This patient is not fully capable of making decisions on his own behalf. 6. Skin/Wound Care: Routine skin checks 7.  Fluids/Electrolytes/Nutrition: Routine I&O with follow-up chemistries 8. Hypertension. Minipress 5 mg daily at bedtime,Ziac 10-6..25 mg daily. 9. Hyperlipidemia. Lipitor 10. Rhabdomyolysis. Follow-up chemistries. CK trending down 11. Left BBB with tachycardia. Echocardiogram reviewed per cardiology services troponin negative. No further plans for cardiac workup 12. Leukocytosis with possible pneumonia. Empiric Augmentin added 12/26/2016. 13. Tobacco abuse. Counseling   LOS (Days) 1 A FACE TO FACE EVALUATION WAS PERFORMED  Perry Simons T, MD 12/27/2016 9:00 AM

## 2016-12-27 NOTE — Progress Notes (Signed)
Perry Diones, RN Rehab Admission Coordinator Signed Physical Medicine and Rehabilitation  PMR Pre-admission Date of Service: 12/26/2016 12:53 PM  Related encounter: ED to Hosp-Admission (Discharged) from 12/23/2016 in [facility]       [] Hide copied text PMR Admission Coordinator Pre-Admission Assessment  Patient: Perry Cervantes is an 66 y.o., male MRN: [id number] DOB: Dec 19, 1950 Height: 5\' 9"  (175.3 cm) Weight: 79.4 kg (175 lb)                                                                                                                                             Insurance Information HMO: No   PPO:       PCP:       IPA:       80/20:       OTHER:   PRIMARY:  Medicare A/B      Policy#: [id number]      Subscriber: [name] CM Name:        Phone#:       Fax#:   Pre-Cert#:        Employer: Disabled Benefits:  Phone #:       Name: Checked in Passport one source [place]. Date: A=01/22/07 and B=01-22-08     Deduct: $1340      Out of Pocket Max: none      Life Max: N/A CIR: 100%      SNF: 100 days Outpatient: 80%     Co-Pay: 20% Home Health: 100%      Co-Pay: none DME: 80%     Co-Pay: 20% Providers: patient's choice  Emergency Contact Information        Contact Information    Name Relation Home Work Duarte [phone]  [phone]     Current Medical History  Patient Admitting Diagnosis:  Left CR infarct  History of Present Illness: A 66 y.o.right handed malewith history of chronic back pain, hypertension, PTSD and tobacco abuse.Per chart review patient lives with wife who is retired. Patient uses occasional cane prior to admission. 2 level with bedroom on main level.Presented 12/23/2016 with multiple falls and right-sided weakness. Cranial CT scan negative for acute changes. CT cervical spine showed moderate central disc protrusion at C4-5. C5-C7 anterior fusion. MRI of the brain showed a 2 cm acute infarction in  the left corona radiata. Echocardiogram with ejection fraction of 65% no wall motion abnormalities. Patient did not receive TPA. Carotid Dopplers had no ICA stenosis. White blood cell count elevated 16,200 with CK elevated 11,003. Troponin negative. MRA of the brain is pending. Urine drug screen pending. Neurology consulted placed on aspirin for CVA prophylaxis. Subcutaneous heparin for DVT prophylaxis. Physical therapy evaluation completed 12/25/2016 with recommendations of physical medicine rehabilitation consult. Note:  Patient focused on his PTSD, states that his medications were off schedule. Last night. Upset  that he had to go to an MRI scan and lethis breakfast to get cold.   Total: 7=NIH  Past Medical History      Past Medical History:  Diagnosis Date  . Anxiety   . Chronic back pain    Spondylolisthesis, spondylosis, scoliosis, and radiculopathy  . Fibromyalgia   . Gastroesophageal reflux disease   . History of colon polyps   . History of kidney stones   . Hypertension   . Mild depression ([place])   . PTSD (post-traumatic stress disorder)   . Spondylolysis of lumbar region     Family History  family history includes Heart attack in his mother; Other in his father.  Prior Rehab/Hospitalizations: Has a back fusion done a couple years ago.  Has the patient had major surgery during 100 days prior to admission? No  Current Medications   Current Facility-Administered Medications:  .   stroke: mapping our early stages of recovery book, , Does not apply, Once, Eulogio Bear U, DO .  amoxicillin-clavulanate (AUGMENTIN) 875-125 MG per tablet 1 tablet, 1 tablet, Oral, Q12H, Vann, Jessica U, DO, 1 tablet at 12/26/16 1025 .  atorvastatin (LIPITOR) tablet 40 mg, 40 mg, Oral, q1800, Emokpae, Ejiroghene E, MD, 40 mg at 12/25/16 1733 .  bisoprolol-hydrochlorothiazide (ZIAC) 10-6.25 MG per tablet 1 tablet, 1 tablet, Oral, Daily, Vann, Jessica U, DO, 1 tablet at 12/26/16  1025 .  clopidogrel (PLAVIX) tablet 75 mg, 75 mg, Oral, Daily, Patteson, Samuel A, NP .  diazepam (VALIUM) tablet 10 mg, 10 mg, Oral, Q12H PRN, Vann, Jessica U, DO, 10 mg at 12/26/16 1050 .  diazepam (VALIUM) tablet 5 mg, 5 mg, Oral, QHS PRN, Vann, Jessica U, DO, 5 mg at 12/25/16 2115 .  escitalopram (LEXAPRO) tablet 20 mg, 20 mg, Oral, Daily, Emokpae, Ejiroghene E, MD, 20 mg at 12/26/16 1025 .  famotidine (PEPCID) tablet 10 mg, 10 mg, Oral, BID, Emokpae, Ejiroghene E, MD, 10 mg at 12/26/16 1025 .  gabapentin (NEURONTIN) capsule 300 mg, 300 mg, Oral, TID, Emokpae, Ejiroghene E, MD, 300 mg at 12/26/16 1025 .  heparin injection 5,000 Units, 5,000 Units, Subcutaneous, Q8H, Emokpae, Ejiroghene E, MD, 5,000 Units at 12/26/16 1311 .  HYDROcodone-acetaminophen (NORCO/VICODIN) 5-325 MG per tablet 1-2 tablet, 1-2 tablet, Oral, Q4H PRN, Emokpae, Ejiroghene E, MD, 2 tablet at 12/25/16 2008 .  labetalol (NORMODYNE,TRANDATE) injection 10 mg, 10 mg, Intravenous, Q4H PRN, Emokpae, Ejiroghene E, MD .  prazosin (MINIPRESS) capsule 5 mg, 5 mg, Oral, QHS, Emokpae, Ejiroghene E, MD, 5 mg at 12/25/16 2116 .  QUEtiapine (SEROQUEL) tablet 100 mg, 100 mg, Oral, QHS, Emokpae, Ejiroghene E, MD, 100 mg at 12/25/16 2114 .  senna-docusate (Senokot-S) tablet 1 tablet, 1 tablet, Oral, QHS PRN, Emokpae, Ejiroghene E, MD  Patients Current Diet: Diet Heart Room service appropriate? Yes; Fluid consistency: Thin Diet - low sodium heart healthy  Precautions / Restrictions Precautions Precautions: Fall Restrictions Weight Bearing Restrictions: No   Has the patient had 2 or more falls or a fall with injury in the past year?Yes Patient reports 6-7 falls with most recent fall resulting in injury.  Prior Activity Level Community (5-7x/wk): Went out 3-4 times a week, was driving.  Home Assistive Devices / Equipment Home Assistive Devices/Equipment: None Home Equipment: Bedside commode, Walker - 2 wheels, Cane - single  point, Hand held shower head  Prior Device Use: Indicate devices/aids used by the patient prior to current illness, exacerbation or injury? Cane  Prior Functional Level Prior Function Level of Independence:  Independent Comments: hx of many falls  Self Care: Did the patient need help bathing, dressing, using the toilet or eating?  Independent  Indoor Mobility: Did the patient need assistance with walking from room to room (with or without device)? Independent  Stairs: Did the patient need assistance with internal or external stairs (with or without device)? Independent  Functional Cognition: Did the patient need help planning regular tasks such as shopping or remembering to take medications? Independent  Current Functional Level Cognition  Arousal/Alertness: Awake/alert Overall Cognitive Status: Impaired/Different from baseline Current Attention Level: Sustained Orientation Level: Oriented X4 Following Commands: Follows one step commands consistently (but easily distracted) Safety/Judgement: Decreased awareness of safety, Decreased awareness of deficits General Comments: patient with poor attention, restlessness, imulsivity and poor ability to sustain task performance. Very easily distracted and tangential during session Attention: Sustained Sustained Attention: Impaired Sustained Attention Impairment: Verbal basic Memory: Impaired Memory Impairment: Storage deficit, Retrieval deficit Awareness: Impaired Awareness Impairment: Intellectual impairment, Emergent impairment, Anticipatory impairment Problem Solving: Appears intact (for basic) Behaviors:  (anxious) Safety/Judgment: Impaired    Extremity Assessment (includes Sensation/Coordination)  Upper Extremity Assessment: RUE deficits/detail RUE Deficits / Details: 2/5 shld shrug otherwise no voluntary movement, impaired sensation, low tone RUE Coordination: decreased fine motor, decreased gross motor  Lower Extremity  Assessment: RLE deficits/detail RLE Deficits / Details: hip flexion 2/5, knee extension 2/5, ankle 2-/5    ADLs  Overall ADL's : Needs assistance/impaired Eating/Feeding: Set up, Sitting Grooming: Wash/dry hands, Wash/dry face, Moderate assistance, Sitting Upper Body Bathing: Maximal assistance, Sitting Lower Body Bathing: Total assistance, Sitting/lateral leans Lower Body Bathing Details (indicate cue type and reason): socks Upper Body Dressing : Moderate assistance, Sitting Lower Body Dressing: Total assistance, Sitting/lateral leans Toilet Transfer: Moderate assistance, Squat-pivot, BSC Toileting- Clothing Manipulation and Hygiene: Total assistance, Sitting/lateral lean    Mobility  Overal bed mobility: Needs Assistance Bed Mobility: Supine to Sit Supine to sit: Mod assist General bed mobility comments: Patient able to bring LEs to EOB, moderate assist to elevate trunk to upright     Transfers  Overall transfer level: Needs assistance Transfers: Sit to/from Stand, Stand Pivot Transfers Sit to Stand: Mod assist Stand pivot transfers: Mod assist, +2 physical assistance Squat pivot transfers: Mod assist General transfer comment: Patient required knee blocking RLE and facilitation of weight shifts to advance RLE during pivotal movements to chair    Ambulation / Gait / Stairs / Wheelchair Mobility       Posture / Balance Dynamic Sitting Balance Sitting balance - Comments: pt tolerated sitting x 10 min, pt with strong posterior lean with LE LAQ requiring assist to come back to midline Balance Overall balance assessment: Needs assistance Sitting-balance support: Feet supported, Single extremity supported Sitting balance-Leahy Scale: Fair Sitting balance - Comments: pt tolerated sitting x 10 min, pt with strong posterior lean with LE LAQ requiring assist to come back to midline Postural control: Posterior lean, Left lateral lean Standing balance support: During functional  activity, Single extremity supported Standing balance-Leahy Scale: Poor Standing balance comment: Significant reliance on therapist assist    Special needs/care consideration BiPAP/CPAP No CPM No Continuous Drip IV No Dialysis No     Life Vest No Oxygen No Special Bed No Trach Size No Wound Vac (area) No     Skin Has dressing on both elbows where he fell and injured his arms.  Bowel mgmt: Last BM 8-31/18 Bladder mgmt: External catheter Diabetic mgmt No    Previous Home Environment Living Arrangements: Spouse/significant other  Lives With: Spouse Available Help at Discharge: Family, Available 24 hours/day (wife cannot physically assist) Type of Home: House Home Layout: Two level, Able to live on main level with bedroom/bathroom Home Access: Stairs to enter Entrance Stairs-Rails: None Entrance Stairs-Number of Steps: 3 Bathroom Shower/Tub: Chiropodist: Standard Home Care Services: No  Discharge Living Setting Plans for Discharge Living Setting: House, Lives with (comment) (Lives with wife.) Type of Home at Discharge: House Discharge Home Layout: Two level, Laundry or work area in basement, Able to live on main level with bedroom/bathroom Alternate Level Stairs-Number of Steps: 17 steps down to basement and also 17 steps up to apartment where grandson lives. Discharge Home Access: Stairs to enter Entrance Stairs-Number of Steps: 4 steps at front entry Does the patient have any problems obtaining your medications?: No  Social/Family/Support Systems Patient Roles: Spouse (Has a wife.) Contact Information: Drake Leach - wife Anticipated Caregiver: wife Anticipated Caregiver's Contact Information:  Horris Latino - [phone] Ability/Limitations of Caregiver: Not working, is disabled and then retired, can provide supervision Caregiver Availability: 24/7 Discharge Plan Discussed with Primary Caregiver: Yes Is Caregiver In  Agreement with Plan?: Yes Does Caregiver/Family have Issues with Lodging/Transportation while Pt is in Rehab?: No  Goals/Additional Needs Patient/Family Goal for Rehab: PT/OT supervision goals Expected length of stay: 12-16 days Cultural Considerations: None Dietary Needs: Heart diet, thin liquids Equipment Needs: TBD Pt/Family Agrees to Admission and willing to participate: Yes Program Orientation Provided & Reviewed with Pt/Caregiver Including Roles  & Responsibilities: Yes  Decrease burden of Care through IP rehab admission: N/A  Possible need for SNF placement upon discharge: Not anticipated  Patient Condition: This patient's condition remains as documented in the consult dated 12/26/16, in which the Rehabilitation Physician determined and documented that the patient's condition is appropriate for intensive rehabilitative care in an inpatient rehabilitation facility. Will admit to inpatient rehab today.  Preadmission Screen Completed By:  Perry Cervantes, 12/26/2016 2:11 PM ______________________________________________________________________   Discussed status with Dr. Posey Pronto on 12/26/16 at 1410 and received telephone approval for admission today.  Admission Coordinator:  Perry Cervantes, time 1410/Date 12/26/16       Cosigned by: Jamse Arn, MD at 12/26/2016 3:21 PM  Revision History

## 2016-12-27 NOTE — Care Management Note (Signed)
[facility] Individual Statement of Services  Patient Name:  [name]  Date:  12/27/2016  Welcome to the [facility].  Our goal is to provide you with an individualized program based on your diagnosis and situation, designed to meet your specific needs.  With this comprehensive rehabilitation program, you will be expected to participate in at least 3 hours of rehabilitation therapies Monday-Friday, with modified therapy programming on the weekends.  Your rehabilitation program will include the following services:  Physical Therapy (PT), Occupational Therapy (OT), 24 hour per day rehabilitation nursing, Neuropsychology, Case Management (Social Worker), Rehabilitation Medicine and Nutrition Services  Weekly team conferences will be held on Tuesday to discuss your progress.  Your Social Worker will talk with you frequently to get your input and to update you on team discussions.  Team conferences with you and your family in attendance may also be held.  Expected length of stay: 18-21 days  Overall anticipated outcome: supervision-min assist level  Depending on your progress and recovery, your program may change. Your Social Worker will coordinate services and will keep you informed of any changes. Your Social Worker's name and contact numbers are listed  below.  The following services may also be recommended but are not provided by the [facility] will be made to provide these services after discharge if needed.  Arrangements include referral to agencies that provide these services.  Your insurance has been verified to be:  Medicare Your primary doctor is:  Monico Blitz  Pertinent information will be shared with your doctor and your insurance company.  Social Worker:  Ovidio Kin, [place] or ([phone]  Information discussed with and copy given to patient by: Elease Hashimoto, 12/27/2016, 3:01 PM

## 2016-12-27 NOTE — Telephone Encounter (Signed)
phone call from patient's wife, said he had a stroke, and is in [facility], been there for about a week.  Said she don't know when his appointments are.

## 2016-12-27 NOTE — Progress Notes (Signed)
Patient bed alarm went, RN went in the hall way and went in the patient room, she found kneeling on the floor with his left side  side against the bed at 2235, he had an incontinent loose stool, was cleaned and assisted back to bed with 4 helpers  using lift, patient was alert, oriented x3, he was assessed and complained of pain in the left flank area, able to move both legs and left  but the right leg is weak, patient speech is clear, Reesa Chew, PA was called and ordered to medicate patient with Tylenol and apply ice pack to the painful site, VS WND, see chart, wife  Was called but the voice mail came on, message was left for the wifeno visual signs of acute distress noted, patient has been placed on Low bed, bed alarm is on with side rails up x4  Will continue to monitor.

## 2016-12-27 NOTE — Progress Notes (Signed)
Social Work Assessment and Plan Social Work Assessment and Plan  Patient Details  Name: Perry Cervantes MRN: [id number] Date of Birth: 08-Nov-1950  Today's Date: 12/27/2016  Problem List:  Patient Active Problem List   Diagnosis Date Noted  . 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 Right hemiparesis ([place])   . 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 Neuropathic pain   . Adjustment disorder with mixed anxiety and depressed mood   . Benign essential HTN   . Hyperlipidemia   . Left bundle branch block   . Leukocytosis   . Tobacco abuse   . CVA (cerebral vascular accident) ([place]) 12/24/2016  . PTSD (post-traumatic stress disorder) 12/23/2016  . Depression 12/23/2016  . Chronic back pain 12/23/2016  . Right sided weakness 12/23/2016  . AKI (acute kidney injury) ([place]) 12/23/2016  . Rhabdomyolysis 12/23/2016  . Lumbar scoliosis 05/14/2013  . Heme positive stool 03/24/2012  . GERD (gastroesophageal reflux disease) 03/24/2012   Past Medical History:  Past Medical History:  Diagnosis Date  . Anxiety   . Chronic back pain    Spondylolisthesis, spondylosis, scoliosis, and radiculopathy  . Fibromyalgia   . Gastroesophageal reflux disease   . History of colon polyps   . History of kidney stones   . Hypertension   . Mild depression ([place])   . PTSD (post-traumatic stress disorder)   . Spondylolysis of lumbar region    Past Surgical History:  Past Surgical History:  Procedure Laterality Date  . BACK SURGERY     x 2  . COLONOSCOPY  03/13/2005   RMR: Normal rectum. Diminutive polyp at hepatic flexure cold biopsied/removed (inflamed, benign). Otherwise normal colon, normal terminal ileum  . COLONOSCOPY N/A 07/28/2012   RMR: Colonic diverticulosis. diminutive colonic polyps (hyerplastic)-removed as described above. Colonic erosion ulceration ad described above. Status post biopsy. I suspect NSAID insult to his colon  rather than inflammatory bowel disease . These finding could easily explain Hemocult-positve stool. EGD not needed at this time. Next TCS 07/2022.  . ESOPHAGOGASTRODUODENOSCOPY  03/13/2005   RMR: Small hiatal hernia  . NECK SURGERY  2008   fusion  . Right knee arthroscopy     x 2   Social History:  reports that he has been smoking Cigarettes.  He has a 25.00 pack-year smoking history. He has never used smokeless tobacco. He reports that he drinks alcohol. He reports that he uses drugs, including Marijuana.  Family / Support Systems Marital Status: Married How Long?: 25 years Patient Roles: Spouse, Parent Spouse/Significant Other: Bonnie-[phone] or [phone] Children: Brandy-daughter in [place]  Four son's who are not supportive Other Supports: Friends Anticipated Caregiver: Wife Ability/Limitations of Caregiver: Wife has recnetly been hospitalized and is home recovering according to pt. Can do supervision level Caregiver Availability: Other (Comment) (Need to confirm with wife amount of care she can provide to pt) Family Dynamics: Pt has been married to second wife for 25 years, he has children with his first wife and his daughter is supportive but out of state. Horris Latino has a child but is alos out of state. Some church members are supportive and visit sometimes  Social History Preferred language: English Religion: Methodist Cultural Background: No issues Education: EMT school-paramedic Read: Yes Write: Yes Employment Status: Disabled Freight forwarder Issues: No issues Guardian/Conservator: None-according to MD pt is capable of making his own decisions while here, he does become tangential and rants at times.   Abuse/Neglect  Physical Abuse: Denies Verbal Abuse: Denies Sexual Abuse: Denies Exploitation of patient/patient's resources: Denies Self-Neglect: Denies  Emotional Status Pt's affect, behavior adn adjustment status: Pt has always been  independent and plans on being again. He voiced his boys have asked for his truck and four wheeler, to which he responded. " I'm not dead yet." He was falling around his house for a few days before CVA was diagnosed. He feels it it the ER_ MD's fault it was missed. Recent Psychosocial Issues: Other health issues-PTSD, Chronic back issues, etc Pyschiatric History: PTSD takes medications for this, has not seen anyone for this was suppose to in May, but didn't. He would benfit from seeing neuro-psych while here. Feel he could very easily become depressed due to his situation and wife's. Will make referral. Substance Abuse History: No issues  Patient / Family Perceptions, Expectations & Goals Pt/Family understanding of illness & functional limitations: Pt can explain his stroke and is very angry the ER-MD missed this, after going to ER several times. He has spoken with the MD and feels he is getting the answers now he is here at [facility], but doesn't like being away from wife. Espeically due to she can't drive Premorbid pt/family roles/activities: Husband, father, grandfather, band member, church member, etc Anticipated changes in roles/activities/participation: resume Pt/family expectations/goals: Pt states: " [facility] will hear from me from all of the news channels." " I have to get independent my wife is recovering and can't help me."  [employer]: None Premorbid Home Care/DME Agencies: None Transportation available at discharge: Friend, unsure who will drive them Resource referrals recommended: Neuropsychology, Support group (specify)  Discharge Planning Living Arrangements: Spouse/significant other Support Systems: Spouse/significant other, Friends/neighbors Type of Residence: Private residence Insurance Resources: [employer] Financial Resources: SSD, Family Support Financial Screen Referred: No Living Expenses: Own Money Management: Spouse, Patient Does the patient have  any problems obtaining your medications?: No Home Management: Both he and wife, was helping wife after her release for the hospital recently. Worried about her. Patient/Family Preliminary Plans: Pt is worried aobut his wife reports recently released from the hospital and he was helpng her. he has now had a stroke and feels with all of his ER visits should not have been missed. He is very upset about this and plans on taking action. He is aware wife can not assist him and is wondering what he will do at discharge from here. Sw Barriers to Discharge: Lack of/limited family support Sw Barriers to Discharge Comments: Wife recently released from hospital-not able to assist him Social Work Anticipated Follow Up Needs: HH/OP, SNF, Support Group  Clinical Impression Pleasant tangential gentleman who is angry at [facility] for misdiagnosing him and having a stroke in general. He was helping wife after her hospital discharge and is worried about her. She can't drive and he doesn't know how she will get what she needs for herself or come and visit him this far away. He is upset with his son's who are not doing anything for him and still feels his first wife is the cause of this and the anger she has for him still. If in fact wife is unable to assist pt he will probably need to go to a NH after CIR for more therapies to get to the level he can manage at home. Have left message for wife and will await her return call to confirm this information. Will make referral for neuro-psych to see for coping and his PTSD.   Arlis Yale,  Gardiner Rhyme 12/27/2016, 4:04 PM

## 2016-12-27 NOTE — Progress Notes (Signed)
Perry Cervantes, Perry Salk, MD Physician Signed Physical Medicine and Rehabilitation  Consult Note Date of Service: 12/26/2016 6:00 AM  Related encounter: ED to Hosp-Admission (Discharged) from 12/23/2016 in [place] Collapse All   [] Hide copied text [] Hover for attribution information      Physical Medicine and Rehabilitation Consult Reason for Consult: Right sided weakness Referring Physician: [facility]   HPI: Perry Cervantes is a 66 y.o. right handed male with history of chronic back pain, hypertension, PTSD and tobacco abuse. Per chart review patient lives with wife who is retired. Patient uses occasional cane prior to admission. 2 level with bedroom on main level. Presented 12/23/2016 with multiple falls and right-sided weakness. Cranial CT scan negative for acute changes. CT cervical spine showed moderate central disc protrusion at C4-5. C5-C7 anterior fusion. 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 Echocardiogram with ejection fraction of 65% no wall motion abnormalities. Patient did not receive TPA. Carotid Dopplers had no ICA stenosis. White blood cell count elevated 16,200 with CK elevated 11,003. Troponin negative. MRA of the brain is pending. Urine drug screen pending. Neurology consulted placed on aspirin for CVA prophylaxis. Subcutaneous heparin for DVT prophylaxis. Physical therapy evaluation completed 12/25/2016 with recommendations of physical medicine rehabilitation consult.  Patient focused on his PTSD, states that his medications were off schedule. Last night. Upset that he had to go to an MRI scan and let his breakfast to get cold.   Review of Systems  Constitutional: Negative for chills and fever.  HENT: Negative for hearing loss.   Eyes: Negative for blurred vision and double vision.  Respiratory: Positive for cough. Negative for shortness of breath.   Cardiovascular: Negative for chest  pain, palpitations and leg swelling.  Gastrointestinal: Positive for constipation. Negative for nausea and vomiting.  Genitourinary: Negative for dysuria, flank pain and hematuria.  Musculoskeletal: Positive for back pain and falls.  Skin: Negative for rash.  Neurological: Positive for dizziness and weakness. Negative for seizures.  Psychiatric/Behavioral: Positive for depression.       Anxiety, PTSD  All other systems reviewed and are negative.      Past Medical History:  Diagnosis Date  . Anxiety   . Chronic back pain    Spondylolisthesis, spondylosis, scoliosis, and radiculopathy  . Fibromyalgia   . Gastroesophageal reflux disease   . History of colon polyps   . History of kidney stones   . Hypertension   . Mild depression ([place])   . PTSD (post-traumatic stress disorder)   . Spondylolysis of lumbar region         Past Surgical History:  Procedure Laterality Date  . BACK SURGERY     x 2  . COLONOSCOPY  03/13/2005   RMR: Normal rectum. Diminutive polyp at hepatic flexure cold biopsied/removed (inflamed, benign). Otherwise normal colon, normal terminal ileum  . COLONOSCOPY N/A 07/28/2012   RMR: Colonic diverticulosis. diminutive colonic polyps (hyerplastic)-removed as described above. Colonic erosion ulceration ad described above. Status post biopsy. I suspect NSAID insult to his colon rather than inflammatory bowel disease . These finding could easily explain Hemocult-positve stool. EGD not needed at this time. Next TCS 07/2022.  . ESOPHAGOGASTRODUODENOSCOPY  03/13/2005   RMR: Small hiatal hernia  . NECK SURGERY  2008   fusion  . Right knee arthroscopy     x 2        Family History  Problem Relation Age  of Onset  . Heart attack Mother   . Other Father        deceased age 39 of Staph sepsis  . Colon cancer Neg Hx    Social History:  reports that he has been smoking Cigarettes.  He has a 25.00 pack-year smoking history. He has never used  smokeless tobacco. He reports that he drinks alcohol. He reports that he uses drugs, including Marijuana. Allergies: No Known Allergies       Medications Prior to Admission  Medication Sig Dispense Refill  . amLODipine (NORVASC) 10 MG tablet Take 10 mg by mouth daily.     Marland Kitchen aspirin 81 MG tablet Take 81 mg by mouth daily.    . bisoprolol-hydrochlorothiazide (ZIAC) 10-6.25 MG per tablet Take 1 tablet by mouth daily.     . diazepam (VALIUM) 10 MG tablet Take one twice a day and two at bedtime (Patient taking differently: Take 10-20 mg by mouth 3 (three) times daily. Take one twice a day and two at bedtime) 120 tablet 2  . escitalopram (LEXAPRO) 20 MG tablet Take 1 tablet (20 mg total) by mouth 2 (two) times daily. 60 tablet 2  . gabapentin (NEURONTIN) 300 MG capsule Take 300 mg by mouth 3 (three) times daily.     Marland Kitchen ibuprofen (ADVIL,MOTRIN) 800 MG tablet Take 1 tablet (800 mg total) by mouth every 8 (eight) hours as needed for moderate pain. 20 tablet 0  . lisinopril (PRINIVIL,ZESTRIL) 5 MG tablet Take 5 mg by mouth daily.     Marland Kitchen loratadine (CLARITIN) 10 MG tablet Take 10 mg by mouth daily as needed for allergies.    . pantoprazole (PROTONIX) 40 MG tablet TAKE (1) TABLET BY MOUTH ONCE DAILY. 30 tablet 3  . prazosin (MINIPRESS) 5 MG capsule Take 1 capsule (5 mg total) by mouth at bedtime. 30 capsule 2  . QUEtiapine (SEROQUEL) 100 MG tablet Take 100 mg by mouth at bedtime.     . ranitidine (ZANTAC) 150 MG capsule Take 150 mg by mouth 2 (two) times daily.      Home: Home Living Family/patient expects to be discharged to:: Inpatient rehab Living Arrangements: Spouse/significant other Available Help at Discharge: Family, Available 24 hours/day (wife cannot physically assist) Type of Home: House Home Access: Stairs to enter [employer] of Steps: 3 Entrance Stairs-Rails: None Home Layout: Two level, Able to live on main level with bedroom/bathroom Bathroom Shower/Tub:  Chiropodist: Standard Home Equipment: Bedside commode, Environmental consultant - 2 wheels, Cane - single point, Hand held shower head  Lives With: Spouse  Functional History: Prior Function Level of Independence: Independent Comments: hx of many falls Functional Status:  Mobility: Bed Mobility Overal bed mobility: Needs Assistance Bed Mobility: Supine to Sit Supine to sit: Max assist General bed mobility comments: assist for LEs over EOB, to raise trunk and to square hips up, pt requiring cues for sequencing, increased time Transfers Overall transfer level: Needs assistance Transfers: Sit to/from Stand, Stand Pivot Transfers Sit to Stand: Mod assist Stand pivot transfers: Mod assist Squat pivot transfers: Mod assist General transfer comment: R knee blocked, pt able to push up into standing but with lean to the L, modA to achieve midline and equal WBing between R and L foot. Pt with R Knee hyperextension. with max directional v/c's and modA pt able to complete stand pvt transfer with modA for R LE placement and advancement and to maintain balance. Pt initiated stepping with R foot, R knee did go into  hyperextension with advance ment of L foot.  ADL: ADL Overall ADL's : Needs assistance/impaired Eating/Feeding: Set up, Sitting Grooming: Wash/dry hands, Wash/dry face, Moderate assistance, Sitting Upper Body Bathing: Maximal assistance, Sitting Lower Body Bathing: Total assistance, Sitting/lateral leans Lower Body Bathing Details (indicate cue type and reason): socks Upper Body Dressing : Moderate assistance, Sitting Lower Body Dressing: Total assistance, Sitting/lateral leans Toilet Transfer: Moderate assistance, Squat-pivot, BSC Toileting- Clothing Manipulation and Hygiene: Total assistance, Sitting/lateral lean  Cognition: Cognition Overall Cognitive Status: Impaired/Different from baseline Arousal/Alertness: Awake/alert Orientation Level: Oriented X4 Attention:  Sustained Sustained Attention: Impaired Sustained Attention Impairment: Verbal basic Memory: Impaired Memory Impairment: Storage deficit, Retrieval deficit Awareness: Impaired Awareness Impairment: Intellectual impairment, Emergent impairment, Anticipatory impairment Problem Solving: Appears intact (for basic) Behaviors:  (anxious) Safety/Judgment: Impaired Cognition Arousal/Alertness: Awake/alert Behavior During Therapy: Impulsive Overall Cognitive Status: Impaired/Different from baseline Area of Impairment: Safety/judgement, Following commands, Attention, Problem solving Current Attention Level: Sustained Following Commands: Follows one step commands consistently (but easily distracted) Safety/Judgement: Decreased awareness of safety, Decreased awareness of deficits Problem Solving: Slow processing, Difficulty sequencing, Requires verbal cues, Requires tactile cues General Comments: pt with tangential speech, easily distracted. pt even became tearful during eval because "i just like to joke around, i'm not trying to be ugly"  Blood pressure (!) 152/70, pulse (!) 101, temperature 98.4 F (36.9 C), temperature source Oral, resp. rate 18, height 5\' 9"  (1.753 m), weight 79.4 kg (175 lb), SpO2 95 %. Physical Exam  Vitals reviewed. Constitutional: He appears well-developed.  HENT:  Head: Normocephalic.  Eyes: EOM are normal.  Neck: Normal range of motion. Neck supple. No thyromegaly present.  Cardiovascular: Normal rate, regular rhythm and normal heart sounds.   Respiratory: Effort normal and breath sounds normal. No respiratory distress.  GI: Soft. Bowel sounds are normal. He exhibits no distension.  Neurological: He is alert.  Patient is a bit anxious as well as impulsive. Follows simple commands. Oriented to person place and time.  Skin: Skin is warm and dry.  Right upper extremity 0/5 in the deltoid by stress of grip Right lower extremity 3 minus. Hip flexion, knee extension 2  minus, ankle dorsiflexion Left upper extremity 5/5 in the left deltoid, bicep, tricep, grip Left Lower extremity 5/5 in hip flexion, knee extension, ankle dorsi flexion, plantar flexion Sensation is reported as equal in bilateral upper and lower limbs to light touch  Lab Results Last 24 Hours       Results for orders placed or performed during the hospital encounter of 12/23/16 (from the past 24 hour(s))  CBC     Status: Abnormal   Collection Time: 12/26/16  5:27 AM  Result Value Ref Range   WBC 14.2 (H) 4.0 - 10.5 K/uL   RBC 3.64 (L) 4.22 - 5.81 MIL/uL   Hemoglobin 12.3 (L) 13.0 - 17.0 g/dL   HCT 36.7 (L) 39.0 - 52.0 %   MCV 100.8 (H) 78.0 - 100.0 fL   MCH 33.8 26.0 - 34.0 pg   MCHC 33.5 30.0 - 36.0 g/dL   RDW 13.6 11.5 - 15.5 %   Platelets 171 150 - 400 K/uL      Imaging Results (Last 48 hours)  Mr Brain Wo Contrast  Result Date: 12/25/2016 CLINICAL DATA:  Stroke follow-up. Right lower extremity goes to sleep. Weakness and right upper extremity. EXAM: MRI HEAD WITHOUT CONTRAST TECHNIQUE: Multiplanar, multiecho pulse sequences of the brain and surrounding structures were obtained without intravenous contrast. COMPARISON:  Head CT from 2 days ago  FINDINGS: Brain: 2 cm long area of wedge-shaped restricted diffusion crossing the left corona radiata. No acute hemorrhage, hydrocephalus, or masslike findings. Small remote left cerebellar infarct. Mild scattered FLAIR hyperintensities in the cerebral white matter, usually from chronic small vessel ischemia, although nonspecific. No chronic hemorrhagic foci. Vascular: Absent flow void in the non dominant proximal left V4 segment which appears normalized after the pica, possible retrograde flow. Skull and upper cervical spine: Negative for marrow lesion Sinuses/Orbits: Negative IMPRESSION: 1. ~2 cm acute infarct in the left corona radiata. 2. Mild cerebral white matter disease, nonspecific but often from chronic small vessel ischemia.  3. Slow or absent flow in the proximal left V4 segment with normalized appearance at the vertebrobasilar junction. Electronically Signed   By: Monte Fantasia M.D.   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 Does the need for close, 24 hr/day medical supervision in concert with the patient's rehab needs make it unreasonable for this patient to be served in a less intensive setting? Yes 2. Co-Morbidities requiring supervision/potential complications: PTSD, chronic benzodiazepine use, chronic low back pain, hypertension, rhabdomyolysis 3. Due to bladder management, bowel management, safety, skin/wound care, disease management, medication administration, pain management and patient education, does the patient require 24 hr/day rehab nursing? Yes 4. Does the patient require coordinated care of a physician, rehab nurse, PT (1-2 hrs/day, 5 days/week) and OT (1-2 hrs/day, 5 days/week) to address physical and functional deficits in the context of the above medical diagnosis(es)? Yes Addressing deficits in the following areas: balance, endurance, locomotion, strength, transferring, bowel/bladder control, bathing, dressing, feeding, grooming, toileting and psychosocial support 5. Can the patient actively participate in an intensive therapy program of at least 3 hrs of therapy per day at least 5 days per week? Yes 6. The potential for patient to make measurable gains while on inpatient rehab is good 7. Anticipated functional outcomes upon discharge from inpatient rehab are supervision  with PT, supervision with OT, n/a with SLP. 8. Estimated rehab length of stay to reach the above functional goals is: 12-16d 9. Anticipated D/C setting: Home 10. Anticipated post D/C treatments: [place] therapy 11. Overall Rehab/Functional Prognosis: good  RECOMMENDATIONS: This patient's condition is appropriate for continued rehabilitative care in the  following setting: CIR Patient has agreed to participate in recommended program. Yes Note that insurance prior authorization may be required for reimbursement for recommended care.  CommentCathlyn Parsons., PA-C 12/26/2016    Revision History                        Routing History

## 2016-12-27 NOTE — Plan of Care (Signed)
Problem: RH BOWEL ELIMINATION Goal: RH STG MANAGE BOWEL WITH ASSISTANCE STG Manage Bowel with min Assistance.  Outcome: Not Progressing Incontinent of bowel  Goal: RH STG MANAGE BOWEL W/MEDICATION W/ASSISTANCE STG Manage Bowel with Medication with min Assistance.  Outcome: Progressing Last BM reported since 8/31, medicated with Sorbitol with full relief, had an incontinent stool, total care  Problem: RH BLADDER ELIMINATION Goal: RH STG MANAGE BLADDER WITH ASSISTANCE STG Manage Bladder With min Assistance  Outcome: Not Progressing Incontinent of bladder, using condom cathter tonight  Problem: RH SKIN INTEGRITY Goal: RH STG SKIN FREE OF INFECTION/BREAKDOWN Patients skin will remain free from further breakdown or infection with min assist.  Outcome: Not Progressing Multiple skin abrasions to arms and knees and covered with Tegaderm and Kerlix  Problem: RH PAIN MANAGEMENT Goal: RH STG PAIN MANAGED AT OR BELOW PT'S PAIN GOAL < or equal to 5  Outcome: Progressing Medicated with Tylenol for pain in the left flank area

## 2016-12-28 ENCOUNTER — Inpatient Hospital Stay (HOSPITAL_COMMUNITY): Payer: Self-pay | Admitting: Speech Pathology

## 2016-12-28 ENCOUNTER — Inpatient Hospital Stay (HOSPITAL_COMMUNITY): Payer: Medicare Other | Admitting: Physical Therapy

## 2016-12-28 ENCOUNTER — Inpatient Hospital Stay (HOSPITAL_COMMUNITY): Payer: Self-pay | Admitting: Occupational Therapy

## 2016-12-28 LAB — GLUCOSE, CAPILLARY: GLUCOSE-CAPILLARY: 89 mg/dL (ref 65–99)

## 2016-12-28 NOTE — Progress Notes (Signed)
[facility]IC[facility] REHABILITATION     PROGRESS NOTE    Subjective/Complaints: Feeling better today. Appreciative of change of valium to home schedule. Therapy went well yesterday by his account  ROS: pt denies nausea, vomiting, diarrhea, cough, shortness of breath or chest pain   Objective: Vital Signs: Blood pressure 120/79, pulse 70, temperature 97.9 F (36.6 C), temperature source Oral, resp. rate 18, height 5\' 9"  (1.753 m), weight 79.4 kg (175 lb 0.7 oz), SpO2 98 %. No results found.  Recent Labs  12/26/16 0527 12/27/16 0550  WBC 14.2* 7.1  HGB 12.3* 12.3*  HCT 36.7* 36.5*  PLT 171 176    Recent Labs  12/26/16 0527 12/27/16 0550  NA 137 138  K 4.2 3.9  CL 108 105  GLUCOSE 104* 107*  BUN 17 16  CREATININE 1.02 0.99  CALCIUM 8.2* 8.6*   CBG (last 3)   Recent Labs  12/28/16 0659  GLUCAP 89    Wt Readings from Last 3 Encounters:  12/26/16 79.4 kg (175 lb 0.7 oz)  12/23/16 79.4 kg (175 lb)  12/22/16 79.4 kg (175 lb)    Physical Exam:  Constitutional: He is oriented to person, place, and time. He appears well-developed and well-nourished.  HENT:  Head: Normocephalic and atraumatic.  Eyes: EOM are normal. Right eye exhibits no discharge. Left eye exhibits no discharge.  Neck: supple.  Cardiovascular: RRR without murmur. No JVD  Respiratory: CTA Bilaterally without wheezes or rales. Normal effort   GI: Soft. Bowel sounds are normal. He exhibits no distension.  Musculoskeletal: He exhibits no edema or tenderness.  Neurological: He is alert and oriented to person, place, and time.  Motor: RUE; tr/5 proximal to distal--stable RLE: HF, KE 3+/5, ADF/PF 0/5--stable LUE/LLE: 5/5 proximal to distal DTRs 3+ RUE/RLE Tangential, perseverative behviors  Skin: Skin is warm and dry.  Scattered abrasions  Psychiatric: impulsive and irritable   Assessment/Plan: 1. Right hemiparesis and functional deficits secondary to left corona radiata infarct  which require 3+ hours per day of interdisciplinary therapy in a comprehensive inpatient rehab setting. Physiatrist is providing close team supervision and 24 hour management of active medical problems listed below. Physiatrist and rehab team continue to assess barriers to discharge/monitor patient progress toward functional and medical goals.  Function:  Bathing Bathing position Bathing activity did not occur: Refused    Bathing parts      Bathing assist        Upper Body Dressing/Undressing Upper body dressing   What is the patient wearing?: Pull over shirt/dress     Pull over shirt/dress - Perfomed by patient: Thread/unthread left sleeve, Put head through opening Pull over shirt/dress - Perfomed by helper: Thread/unthread right sleeve, Pull shirt over trunk        Upper body assist Assist Level:  (mod A)      Lower Body Dressing/Undressing Lower body dressing   What is the patient wearing?: Pants, Non-skid slipper socks Underwear - Performed by patient: Thread/unthread left underwear leg Underwear - Performed by helper: Thread/unthread right underwear leg, Pull underwear up/down       Non-skid slipper socks- Performed by helper: Don/doff right sock, Don/doff left sock                  Lower body assist Assist for lower body dressing:  (max a)      Toileting Toileting   Toileting steps completed by patient: Performs perineal hygiene Toileting steps completed by helper: Adjust clothing prior to toileting,  Adjust clothing after toileting    Toileting assist Assist level:  (max A)   Transfers Chair/bed transfer   Chair/bed transfer method: Stand pivot Chair/bed transfer assist level: Maximal assist (Pt 25 - 49%/lift and lower)       Locomotion Ambulation     Max distance: 5' Assist level: Maximal assist (Pt 25 - 49%)   Wheelchair   Type: Manual Max wheelchair distance: 150 Assist Level: Touching or steadying assistance (Pt > 75%)   Cognition Comprehension Comprehension assist level: Follows basic conversation/direction with no assist  Expression Expression assist level: Expresses basic needs/ideas: With no assist  Social Interaction Social Interaction assist level: Interacts appropriately less than 25% of the time. May be withdrawn or combative.  Problem Solving Problem solving assist level: Solves basic 25 - 49% of the time - needs direction more than half the time to initiate, plan or complete simple activities  Memory Memory assist level: Recognizes or recalls 75 - 89% of the time/requires cueing 10 - 24% of the time   Medical Problem List and Plan: 1.  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  DVT Prophylaxis/Anticoagulation: Subcutaneous heparin. Monitor platelet counts and any signs of bleeding 3. Pain Management/chronic back pain: Neurontin 300 mg 3 times a day, hydrocodone as needed 4. Mood/PTSD: Lexapro 20 mg daily, Seroquel 100 mg daily, Valium 10 mg TID  -home regimen is 10-10-20mg    -neuropsych assessment for behavior mgt considerations  -in better spirits today 5. Neuropsych: This patient is not fully capable of making decisions on his own behalf. 6. Skin/Wound Care: Routine skin checks 7. Fluids/Electrolytes/Nutrition: Routine I&O with follow-up chemistries 8. Hypertension. Minipress 5 mg daily at bedtime,Ziac 10-6..25 mg daily. 9. Hyperlipidemia. Lipitor 10. Rhabdomyolysis. Follow-up chemistries. CK trending down 11. Left BBB with tachycardia. Echocardiogram reviewed per cardiology services troponin negative. No further plans for cardiac workup 12. Leukocytosis with possible pneumonia. Empiric Augmentin added 12/26/2016  -continue for 7 days  -recent wbc 7.1. 13. Tobacco abuse. Counseling   LOS (Days) 2 A FACE TO FACE EVALUATION WAS PERFORMED  Meredith Staggers, MD 12/28/2016 9:16 AM

## 2016-12-28 NOTE — Progress Notes (Signed)
Occupational Therapy Session Note  Patient Details  Name: Perry Cervantes MRN: [id number] Date of Birth: 19-Aug-1950  Today's Date: 12/28/2016 OT Individual Time: 1300-1400 OT Individual Time Calculation (min): 60 min    Short Term Goals: Week 1:  OT Short Term Goal 1 (Week 1): Pt will perform toilet transfer with mod A in order to decrease level of assist with functional transfer.  OT Short Term Goal 2 (Week 1): Pt will perform LB dressing with mod A.  OT Short Term Goal 3 (Week 1): Pt will maining dynamic standing balance at mod A for toileting task.  Skilled Therapeutic Interventions/Progress Updates:    OT treatment session focused on Sit<>stand, standing endurance, and R NMR. Pt greeted semi-reclined in bed finishing his lunch with set-up A. Pt transferred bed>wc with Mod A to stronger L side. Sit<>stand with Max A in standing frame. Facilitated weight bearing through R elbow for towel pushes. Worked on R UE pronation and supination. OT brought pt into supination, then pt able to pronate forearm actively. Pt tolerated 5 mins at longest standing bout x4. Pt returned to room at end of session and left with RUE on lap tray and safety belt on.   Therapy Documentation Precautions:  Precautions Precautions: Fall Precaution Comments: PTSD, verbose, angry at adult children who aren't helping his wife Restrictions Weight Bearing Restrictions: No  See Function Navigator for Current Functional Status.   Therapy/Group: Individual Therapy  Valma Cava 12/28/2016, 1:42 PM

## 2016-12-28 NOTE — Progress Notes (Signed)
Physical Therapy Session Note  Patient Details  Name: Perry Cervantes MRN: [id number] Date of Birth: Oct 04, 1950  Today's Date: 12/28/2016 PT Individual Time: 1884-1660 and 6301-6010 PT Individual Time Calculation (min): 57 min and 27 min   Short Term Goals: Week 1:  PT Short Term Goal 1 (Week 1): pt will roll L with min assist and min cues for technique PT Short Term Goal 2 (Week 1): pt wil transfer squat pivot to L/R with min assist PT Short Term Goal 3 (Week 1): pt will propel w/c x 150' with close supervision PT Short Term Goal 4 (Week 1): pt will perform gait with LRAD x 10' , max assist of 1 person  Skilled Therapeutic Interventions/Progress Updates:  Treatment 1: Pt received in bed reporting 10/10 pain in back & R hip but pt reported this improved when repositioned OOB. Pt reporting need to use restroom & required mod assist for supine>sitting EOB with hospital bed features and cuing for technique. Pt completes squat pivot bed>w/c on L with mod assist and max cuing for sequencing/technique. Transported pt in/out of bathroom in w/c & pt completed stand pivot w/c<>elevated seat over toilet with max assist and use of grab bar. Therapist provides assistance with weight shifting and placement; R knee buckling noted. Pt with continent BM on toilet and required total assist for peri hygiene and standing (+2 assist). Pt also requires total assist for donning brief, pants, and max assist for shirt for time management purposes. Pt performs hand hygiene at sink from w/c level with hand over hand assistance for BUE. Pt propels w/c with L hemi technique and mod assist for steering as pt unable to grip floor with grip sock only. At end of session pt left sitting in w/c with QRB donned & all needs within reach. Reviewed use of call bell & pt able to verbalize this back to therapist. Requested NT place chair alarm in pt's seat.   Throughout session pt with tangential conversation and perseverative on his  wife's current home situation requiring max cuing for redirection & attention to task.   Treatment 2: Pt received in room & agreeable to tx. Pt noting lower back & R hip pain - RN made aware & administered meds. Transported pt to parallel bars in day room where pt engaged in pre-gait and RLE weight bearing activities. Pt requires max assist for standing balance in parallel bars with therapist blocking R knee and providing multimodal cuing for upright posture but pt with difficulty self-correcting as he is unaware of postural deficits. Pt performed LLE stepping for increased weight bearing in RLE with therapist providing max cuing for sequencing & technique for task. Pt requires seated rest breaks 2/2 R hip fatigue. Pt with continued tangential conversation throughout session requiring frequent cuing to attend to task. Pt also requires cuing to attend to RUE as pt is impulsive and will let extremity fall off of lap tray. At end of session pt left sitting in w/c in room with QRB donned & all needs within reach.    Therapy Documentation Precautions:  Precautions Precautions: Fall Precaution Comments: PTSD, verbose, angry at adult children who aren't helping his wife Restrictions Weight Bearing Restrictions: No   See Function Navigator for Current Functional Status.   Therapy/Group: Individual Therapy  [name] 12/28/2016, 3:40 PM

## 2016-12-28 NOTE — Evaluation (Signed)
Speech Language Pathology Assessment and Plan  Patient Details  Name: Perry Cervantes MRN: [id number] Date of Birth: [birth year]  SLP Diagnosis: Cognitive Impairments  Rehab Potential: Good ELOS: 21 days    Today's Date: 12/28/2016 SLP Individual Time: 1035-1130 SLP Individual Time Calculation (min): 55 min   Problem List:  Patient Active Problem List   Diagnosis Date Noted  . 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 Right hemiparesis ([place])   . 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 Neuropathic pain   . Adjustment disorder with mixed anxiety and depressed mood   . Benign essential HTN   . Hyperlipidemia   . Left bundle branch block   . Leukocytosis   . Tobacco abuse   . CVA (cerebral vascular accident) ([place]) 12/24/2016  . PTSD (post-traumatic stress disorder) 12/23/2016  . Depression 12/23/2016  . Chronic back pain 12/23/2016  . Right sided weakness 12/23/2016  . AKI (acute kidney injury) ([place]) 12/23/2016  . Rhabdomyolysis 12/23/2016  . Lumbar scoliosis 05/14/2013  . Heme positive stool 03/24/2012  . GERD (gastroesophageal reflux disease) 03/24/2012   Past Medical History:  Past Medical History:  Diagnosis Date  . Anxiety   . Chronic back pain    Spondylolisthesis, spondylosis, scoliosis, and radiculopathy  . Fibromyalgia   . Gastroesophageal reflux disease   . History of colon polyps   . History of kidney stones   . Hypertension   . Mild depression ([place])   . PTSD (post-traumatic stress disorder)   . Spondylolysis of lumbar region    Past Surgical History:  Past Surgical History:  Procedure Laterality Date  . BACK SURGERY     x 2  . COLONOSCOPY  03/13/2005   RMR: Normal rectum. Diminutive polyp at hepatic flexure cold biopsied/removed (inflamed, benign). Otherwise normal colon, normal terminal ileum  . COLONOSCOPY N/A 07/28/2012   RMR: Colonic diverticulosis. diminutive colonic polyps  (hyerplastic)-removed as described above. Colonic erosion ulceration ad described above. Status post biopsy. I suspect NSAID insult to his colon rather than inflammatory bowel disease . These finding could easily explain Hemocult-positve stool. EGD not needed at this time. Next TCS 07/2022.  . ESOPHAGOGASTRODUODENOSCOPY  03/13/2005   RMR: Small hiatal hernia  . NECK SURGERY  2008   fusion  . Right knee arthroscopy     x 2    Assessment / Plan / Recommendation Clinical Impression   Perry DECOLA JRis a 66 y.o.right handed malewith history of chronic back pain, hypertension, PTSD and tobacco abuse.Per chart review patient lives with wife who is retired. Patient uses occasional cane prior to admission. 2 level with bedroom on main level.Wife with a recent fall and can provide limited physical assistance. Presented 12/23/2016 with multiple falls and right-sided weakness. Cranial CT reviewed, negative for acute process. 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 Physical therapy evaluation completed 12/25/2016 with recommendations of physical medicine rehabilitation consult.Patient was admitted for a comprehensive rehabilitation program.  SLP evaluation completed with the following results:  Pt presents with mild-moderate cognitive deficits including decreased safety awareness, decreased emergent awareness of deficits, decreased functional problem solving, and decreased recall of new information.  Given that pt was independent and the primary caregiver for his wife prior to admission, pt would benefit from skilled ST while inpatient in order to maximize functional independence and reduce burden of care prior to discharge.   Anticipate that pt will need 24/7  supervision at discharge in addition to possible ST follow up at next level of care.    Skilled Therapeutic Interventions          Cognitive-linguistic evaluation completed with results and recommendations reviewed  with patient.  Pt was initially very verbose at the beginning of today's evaluation and needed max verbal cues for topic maintenance and explanation of rationale for ST interventions.  Once pt verbalized understanding of ST evaluation, he was able to appropriately sustain his attention to testing measures and scored a 23/30 on the [employer] standardized cognitive assessment; n >/= 26.  Deficits were primarily centered around delayed recall subtest.  Pt also demonstrated impulsivity and decreased safety awareness upon therapist's arrival and needed mod-max assist for use of safety precautions when propelling his wheelchair around the room.  Pt was left in wheelchair at the end of today's therapy session with nursing at bedside.       SLP Assessment  Patient will need skilled Speech Lanaguage Pathology Services during CIR admission    Recommendations  Recommendations for Other Services: Neuropsych consult Patient destination: [place] (SNF) Follow up Recommendations: None Equipment Recommended: None recommended by SLP    SLP Frequency 3 to 5 out of 7 days   SLP Duration  SLP Intensity  SLP Treatment/Interventions 21 days  Minumum of 1-2 x/day, 30 to 90 minutes  Cognitive remediation/compensation;Cueing hierarchy;Environmental controls;Internal/external aids;Patient/family education    Pain Pain Assessment Pain Assessment: No/denies pain  Prior Functioning Cognitive/Linguistic Baseline: Within functional limits Type of Home: House  Lives With: Spouse Available Help at Discharge: Family;Available 24 hours/day Vocation: Full time employment  Function:  Eating Eating                 Cognition Comprehension Comprehension assist level: Follows basic conversation/direction with no assist  Expression   Expression assist level: Expresses basic needs/ideas: With no assist  Social Interaction Social Interaction assist level: Interacts appropriately 75 - 89% of the time  - Needs redirection for appropriate language or to initiate interaction.  Problem Solving Problem solving assist level: Solves basic 50 - 74% of the time/requires cueing 25 - 49% of the time  Memory Memory assist level: Recognizes or recalls 50 - 74% of the time/requires cueing 25 - 49% of the time   Short Term Goals: Week 1: SLP Short Term Goal 1 (Week 1): Pt will recall new, daily information with min assist verbal cues for use of external aids.   SLP Short Term Goal 2 (Week 1): Pt will return demonstration of at least 2 safety precautions wtih min assist verbal cues.   SLP Short Term Goal 3 (Week 1): Pt will recognize and correct errors in the moment during basic, functional tasks with min assist verbal cues.   SLP Short Term Goal 4 (Week 1): Pt will complete basic tasks with min assist verbal cues for functional problem solving.    Refer to Care Plan for Long Term Goals  Recommendations for other services: Neuropsych  Discharge Criteria: Patient will be discharged from SLP if patient refuses treatment 3 consecutive times without medical reason, if treatment goals not met, if there is a change in medical status, if patient makes no progress towards goals or if patient is discharged from hospital.  The above assessment, treatment plan, treatment alternatives and goals were discussed and mutually agreed upon: by patient  Emilio Math 12/28/2016, 4:15 PM

## 2016-12-28 NOTE — IPOC Note (Signed)
Overall Plan of Care [facility]) Patient Details Name: Perry Cervantes MRN: [id number] DOB: [birth year]  Admitting Diagnosis: L CVA  Hospital Problems: Active Problems:   Thrombotic cerebral infarction [facility])     Functional Problem List: Nursing Bladder, Bowel, Endurance, Medication Management, Motor, Skin Integrity, Sensory, Safety, Perception, Pain  PT Balance, Behavior, Endurance, Motor, Safety  OT Balance, Behavior, Cognition, Endurance, Motor, Pain, Safety  SLP    TR         Basic ADL's: OT Grooming, Bathing, Dressing, Toileting     Advanced  ADL's: OT       Transfers: PT Bed Mobility, Bed to Chair, Car, Manufacturing systems engineer, Metallurgist: PT Ambulation, Emergency planning/management officer, Stairs     Additional Impairments: OT Fuctional Use of Upper Extremity  SLP        TR      Anticipated Outcomes Item Anticipated Outcome  Self Feeding set up/S  Swallowing      Basic self-care  S- min A  Toileting  min A   Bathroom Transfers min A  Bowel/Bladder  Contnent ob B/B with minimum assistance  Transfers  supervision basic, furniture, car  Locomotion  supervision gait x 50' home env, 150' controlled env, min assist up/down 4 steps 1 rail  Communication     Cognition     Pain  Acceptable pain scale less than 4  Safety/Judgment  maintain safety precautions and fall prevention, free of fall and injury this rehab admission   Therapy Plan: PT Intensity: Minimum of 1-2 x/day ,45 to 90 minutes PT Frequency: 5 out of 7 days PT Duration Estimated Length of Stay: 18-21 days OT Intensity: Minimum of 1-2 x/day, 45 to 90 minutes OT Frequency: 5 out of 7 days OT Duration/Estimated Length of Stay: 21 days      Team Interventions: Nursing Interventions Bladder Management, Bowel Management, Pain Management, Cognitive Remediation/Compensation, Medication Management, Psychosocial Support, Disease Management/Prevention, Patient/Family Education, Discharge Planning   PT interventions Ambulation/gait training, Training and development officer, Cognitive remediation/compensation, Discharge planning, Community reintegration, DME/adaptive equipment instruction, Functional electrical stimulation, Functional mobility training, Patient/family education, Pain management, Neuromuscular re-education, Psychosocial support, Splinting/orthotics, Therapeutic Exercise, Therapeutic Activities, Stair training, UE/LE Strength taining/ROM, UE/LE Coordination activities, Wheelchair propulsion/positioning  OT Interventions Training and development officer, Cognitive remediation/compensation, Community reintegration, Discharge planning, DME/adaptive equipment instruction, Functional mobility training, Neuromuscular re-education, Pain management, Patient/family education, Psychosocial support, Therapeutic Activities, UE/LE Coordination activities, UE/LE Strength taining/ROM, Self Care/advanced ADL retraining, Therapeutic Exercise, Wheelchair propulsion/positioning  SLP Interventions    TR Interventions    SW/CM Interventions Discharge Planning, Psychosocial Support, Patient/Family Education   Barriers to Discharge MD  Medical stability  Nursing Decreased caregiver support, Medical stability, Home environment access/layout, Medication compliance    PT Inaccessible home environment, Decreased caregiver support, Lack of/limited family support pt's wife was recently hospitalized here at [facility], after a fall with head injury, per pt.  OT Decreased caregiver support, Lack of/limited family support wife recently discharged from hospital and unable to provide assistance  SLP      SW Lack of/limited family support Wife recently released from hospital-not able to assist him   Team Discharge Planning: Destination: PT-Home ,[place] (SNF) , SLP-  Projected Follow-up: PT-Home health PT, OT-  24 hour supervision/assistance, Skilled nursing facility, SLP-  Projected Equipment Needs:  PT-To be determined, OT- To be determined, SLP-  Equipment Details: PT-pt owns RW and SPC, OT-  Patient/family involved in discharge planning: PT- Patient,  OT-Patient, SLP-   MD  ELOS: 18-20 days  Medical Rehab Prognosis:  Excellent Assessment: The patient has been admitted for CIR therapies with the diagnosis of left CVA with right hemiparesis. The team will be addressing functional mobility, strength, stamina, balance, safety, adaptive techniques and equipment, self-care, bowel and bladder mgt, patient and caregiver education, NMR, orthotics, vestibular assessment, emotional support/anxiety mgt. Goals have been set at supervision to min assist with mobiltiy and self-care tasks. Neuro-psych to assist with behavioral mgt plan. Meredith Staggers, MD, FAAPMR      See Team Conference Notes for weekly updates to the plan of care

## 2016-12-29 ENCOUNTER — Inpatient Hospital Stay (HOSPITAL_COMMUNITY): Payer: Medicare Other | Admitting: Speech Pathology

## 2016-12-29 ENCOUNTER — Inpatient Hospital Stay (HOSPITAL_COMMUNITY): Payer: Self-pay | Admitting: Occupational Therapy

## 2016-12-29 ENCOUNTER — Inpatient Hospital Stay (HOSPITAL_COMMUNITY): Payer: Self-pay | Admitting: Physical Therapy

## 2016-12-29 DIAGNOSIS — I6300 Cerebral infarction due to thrombosis of unspecified precerebral artery: Secondary | ICD-10-CM

## 2016-12-29 NOTE — Progress Notes (Signed)
Speech Language Pathology Daily Session Note  Patient Details  Name: Perry Cervantes MRN: [id number] Date of Birth: 03-Aug-1950  Today's Date: 12/29/2016 SLP Individual Time: 4098-1191 SLP Individual Time Calculation (min): 40 min  Short Term Goals: Week 1: SLP Short Term Goal 1 (Week 1): Pt will recall new, daily information with min assist verbal cues for use of external aids.   SLP Short Term Goal 2 (Week 1): Pt will return demonstration of at least 2 safety precautions wtih min assist verbal cues.   SLP Short Term Goal 3 (Week 1): Pt will recognize and correct errors in the moment during basic, functional tasks with min assist verbal cues.   SLP Short Term Goal 4 (Week 1): Pt will complete basic tasks with min assist verbal cues for functional problem solving.    Skilled Therapeutic Interventions: Skilled treatment session focused on cognitive goals. SLP facilitated session by providing Max A verbal cues to maintain topic of conversation and for sustained attention to functional tasks like self-feeding. Patient independently recalled 70% of his medications and was agreeable to organizing a pill box during next session. Patient left upright in bed with all needs within reach. Continue with current plan of care.      Function:   Cognition Comprehension Comprehension assist level: Follows basic conversation/direction with no assist  Expression   Expression assist level: Expresses basic needs/ideas: With no assist  Social Interaction Social Interaction assist level: Interacts appropriately 75 - 89% of the time - Needs redirection for appropriate language or to initiate interaction.  Problem Solving Problem solving assist level: Solves basic 50 - 74% of the time/requires cueing 25 - 49% of the time  Memory Memory assist level: Recognizes or recalls 50 - 74% of the time/requires cueing 25 - 49% of the time    Pain No/Denies Pain   Therapy/Group: Individual Therapy  Valor Quaintance,  Olga Bourbeau 12/29/2016, 12:51 PM

## 2016-12-29 NOTE — Progress Notes (Signed)
Physical Therapy Session Note  Patient Details  Name: Perry Cervantes MRN: [id number] Date of Birth: Mar 18, 1951  Today's Date: 12/29/2016 PT Individual Time: 1455-1550 PT Individual Time Calculation (min): 55 min   Short Term Goals: Week 1:  PT Short Term Goal 1 (Week 1): pt will roll L with min assist and min cues for technique PT Short Term Goal 2 (Week 1): pt wil transfer squat pivot to L/R with min assist PT Short Term Goal 3 (Week 1): pt will propel w/c x 150' with close supervision PT Short Term Goal 4 (Week 1): pt will perform gait with LRAD x 10' , max assist of 1 person  Skilled Therapeutic Interventions/Progress Updates:   Pt in w/c upon arrival and agreeable to therapy, c/o R hip pain and reports he just told RN. Worked on functional mobility this session and pt self-propelled w/c to/from therapy gym w/ Mod A. Transferred to mat w/ Mod A via stand pivot transfer. Maintained static sitting balance on mat for 5-10 minutes w/ Min guard and verbal cues for trunk control and using LUE to support him. Pt tends to fall towards R side, Min A to correct. Performed unilateral UE tasks while reaching outside of BOS w/ emphasis on trunk control w/ R weight-shifting and forward weight-shifting. Min guard throughout activities. Ended session w/ pt in supine once back in room, transferred to sit>supine w/ Mod A, call bell within reach and bed alarm on. All needs met.   Therapy Documentation Precautions:  Precautions Precautions: Fall Precaution Comments: PTSD, verbose, angry at adult children who aren't helping his wife Restrictions Weight Bearing Restrictions: No Vital Signs: Therapy Vitals Temp: 98.5 F (36.9 C) Temp Source: Oral Pulse Rate: 73 Resp: 18 BP: 109/68 Patient Position (if appropriate): Lying Oxygen Therapy SpO2: 99 % O2 Device: Not Delivered  See Function Navigator for Current Functional Status.   Therapy/Group: Individual Therapy  Xander Jutras K Arnette 12/29/2016,  4:14 PM

## 2016-12-29 NOTE — Progress Notes (Signed)
Occupational Therapy Session Note  Patient Details  Name: Perry Cervantes MRN: [id number] Date of Birth: 13-Mar-1951  Today's Date: 12/29/2016  Session 1 OT Individual Time: 1000-1100 OT Individual Time Calculation (min): 60 min   Session 2 OT Individual Time: 1400-1430 OT Individual Time Calculation (min): 30 min   Short Term Goals: Week 1:  OT Short Term Goal 1 (Week 1): Pt will perform toilet transfer with mod A in order to decrease level of assist with functional transfer.  OT Short Term Goal 2 (Week 1): Pt will perform LB dressing with mod A.  OT Short Term Goal 3 (Week 1): Pt will maining dynamic standing balance at mod A for toileting task.  Skilled Therapeutic Interventions/Progress Updates:  Session 1   OT treatment session focused on modified bathing/dressing, improved sit<>stand, and R NMR. Stedy used with Mod A lift and management of R UE.  Pt able to maintain sitting balance on Stedy for transfer. While standing in [place], [place] provided pt with wash cloth and pt able to reach behind with L UE to wash buttocks and peri-care with min/mod A for balance. Incorporated R NMR weight bearing with hand over hand A to wash body parts. Educated pt on hemi-dressing techniques with pt still needing assistance to thread R UE and R LE. Sit<>stand with Mod A and R Knee block, then max A to pull pants up over hips. Pt brushed hair at the sink, then left seated in wc with 1/2 lap tray and safety belt on.   Session 2 Pt reported need for bathroom upon OT arrival. Stedy used to transfer pt bed to toilet with mod A sit<>stand. Worked on Probation officer in standing and hip hike while reaching behind for toilet strategies. Addressed R NMR and standing balance in standing frame. R NMR with towel pushes focused on weight bearing through elbow with increased shoulder adduction, abduction, flexion. Pt left seated in wc with needs met and safety belt on.  Therapy Documentation Precautions:   Precautions Precautions: Fall Precaution Comments: PTSD, verbose, angry at adult children who aren't helping his wife Restrictions Weight Bearing Restrictions: No Pain:  none/denies pain  See Function Navigator for Current Functional Status.   Therapy/Group: Individual Therapy  Valma Cava 12/29/2016, 2:34 PM

## 2016-12-29 NOTE — Progress Notes (Signed)
Patient ID: Perry Cervantes, male   DOB: 1951-01-07, 66 y.o.   MRN: [id number]   12/29/16.  Perry Cervantes is a 66 y.o. 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 Anxiety   . Chronic back pain    Spondylolisthesis, spondylosis, scoliosis, and radiculopathy  . Fibromyalgia   . Gastroesophageal reflux disease   . History of colon polyps   . History of kidney stones   . Hypertension   . Mild depression ([place])   . PTSD (post-traumatic stress disorder)   . Spondylolysis of lumbar region       Subjective: No new complaints. No new problems. Slept well.  Objective: Vital signs in last 24 hours: Temp:  [98 F (36.7 C)-98.2 F (36.8 C)] 98 F (36.7 C) (09/08 0458) Pulse Rate:  [72-75] 75 (09/08 0458) Resp:  [18] 18 (09/08 0458) BP: (99-105)/(64-75) 105/75 (09/08 0458) SpO2:  [95 %-98 %] 98 % (09/08 0458) Weight change:  Last BM Date: 12/28/16  Intake/Output from previous day: 09/07 0701 - 09/08 0700 In: 982 [P.O.:982] Out: 250 [Urine:250] Last cbgs: CBG (last 3)   Recent Labs  12/28/16 0659  GLUCAP 89   BP Readings from Last 3 Encounters:  12/29/16 105/75  12/26/16 134/73  12/22/16 (!) 143/76    Physical Exam General: No apparent distress   HEENT: not dry Lungs: Normal effort. Lungs clear to auscultation, no crackles or wheezes. Cardiovascular: Regular rate and rhythm, no edema Abdomen: S/NT/ND; BS(+) Musculoskeletal:  unchanged Neurological: No new neurological deficits with R HP Wounds: N/A    Skin: clear   Mental state: Alert    Lab Results: BMET    Component Value Date/Time   NA 138 12/27/2016 0550   NA 138 02/26/2012 1309   K 3.9 12/27/2016 0550   K 4.5 02/26/2012 1309   CL 105 12/27/2016 0550   CO2 25 12/27/2016 0550   GLUCOSE 107 (H) 12/27/2016 0550   BUN 16 12/27/2016 0550   BUN 22 (A) 02/26/2012 1309   CREATININE 0.99 12/27/2016  0550   CREATININE 1.63 02/26/2012 1309   CALCIUM 8.6 (L) 12/27/2016 0550   CALCIUM 9.9 02/26/2012 1309   GFRNONAA >60 12/27/2016 0550   GFRAA >60 12/27/2016 0550   CBC    Component Value Date/Time   WBC 7.1 12/27/2016 0550   RBC 3.64 (L) 12/27/2016 0550   HGB 12.3 (L) 12/27/2016 0550   HCT 36.5 (L) 12/27/2016 0550   HCT 52 02/26/2012 1310   PLT 176 12/27/2016 0550   MCV 100.3 (H) 12/27/2016 0550   MCV 98.0 02/26/2012 1310   MCH 33.8 12/27/2016 0550   MCHC 33.7 12/27/2016 0550   RDW 13.6 12/27/2016 0550   LYMPHSABS 1.2 12/27/2016 0550   MONOABS 0.6 12/27/2016 0550   EOSABS 0.3 12/27/2016 0550   BASOSABS 0.0 12/27/2016 0550     Medications: I have reviewed the patient's current medications.  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

## 2016-12-30 NOTE — Plan of Care (Signed)
Problem: RH BLADDER ELIMINATION Goal: RH STG MANAGE BLADDER WITH ASSISTANCE STG Manage Bladder With min Assistance  Outcome: Not Progressing Incontinent-max assistance

## 2016-12-30 NOTE — Progress Notes (Signed)
Patient ID: Perry Cervantes, male   DOB: 06-25-1950, 66 y.o.   MRN: [id number]   12/30/16.  Perry Cervantes is a 66 y.o. 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 No new problems. Remains asymptomatic  Past Medical History:  Diagnosis Date  . Anxiety   . Chronic back pain    Spondylolisthesis, spondylosis, scoliosis, and radiculopathy  . Fibromyalgia   . Gastroesophageal reflux disease   . History of colon polyps   . History of kidney stones   . Hypertension   . Mild depression ([place])   . PTSD (post-traumatic stress disorder)   . Spondylolysis of lumbar region      Objective: Vital signs in last 24 hours: Temp:  [98.5 F (36.9 C)] 98.5 F (36.9 C) (09/09 0330) Pulse Rate:  [72-78] 78 (09/09 0330) Resp:  [18] 18 (09/09 0330) BP: (109-123)/(68-79) 120/75 (09/09 0330) SpO2:  [98 %-99 %] 98 % (09/09 0330) Weight change:  Last BM Date: 12/28/16  Intake/Output from previous day: 09/08 0701 - 09/09 0700 In: 1200 [P.O.:1200] Out: -  Last cbgs: CBG (last 3)   Recent Labs  12/28/16 0659  GLUCAP 89   BP Readings from Last 3 Encounters:  12/30/16 120/75  12/26/16 134/73  12/22/16 (!) 143/76    Physical Exam General: No apparent distress   HEENT: not dry Lungs: Normal effort. Lungs clear to auscultation, no crackles or wheezes. Cardiovascular: Regular rate and rhythm, no edema Abdomen: S/NT/ND; BS(+) Musculoskeletal:  unchanged Neurological: No new neurological deficits with R HP Wounds: N/A    Skin: clear   Mental state: Alert, oriented, cooperative    Lab Results: BMET    Component Value Date/Time   NA 138 12/27/2016 0550   NA 138 02/26/2012 1309   K 3.9 12/27/2016 0550   K 4.5 02/26/2012 1309   CL 105 12/27/2016 0550   CO2 25 12/27/2016 0550   GLUCOSE 107 (H) 12/27/2016 0550   BUN 16 12/27/2016 0550   BUN 22 (A) 02/26/2012 1309   CREATININE 0.99 12/27/2016 0550   CREATININE 1.63 02/26/2012 1309   CALCIUM 8.6 (L) 12/27/2016 0550   CALCIUM 9.9 02/26/2012 1309   GFRNONAA >60 12/27/2016 0550   GFRAA >60 12/27/2016 0550   CBC    Component Value Date/Time   WBC 7.1 12/27/2016 0550   RBC 3.64 (L) 12/27/2016 0550   HGB 12.3 (L) 12/27/2016 0550   HCT 36.5 (L) 12/27/2016 0550   HCT 52 02/26/2012 1310   PLT 176 12/27/2016 0550   MCV 100.3 (H) 12/27/2016 0550   MCV 98.0 02/26/2012 1310   MCH 33.8 12/27/2016 0550   MCHC 33.7 12/27/2016 0550   RDW 13.6 12/27/2016 0550   LYMPHSABS 1.2 12/27/2016 0550   MONOABS 0.6 12/27/2016 0550   EOSABS 0.3 12/27/2016 0550   BASOSABS 0.0 12/27/2016 0550    Medications: I have reviewed the patient's current medications.  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

## 2016-12-31 ENCOUNTER — Inpatient Hospital Stay (HOSPITAL_COMMUNITY): Payer: Self-pay

## 2016-12-31 ENCOUNTER — Ambulatory Visit (HOSPITAL_COMMUNITY): Payer: Medicare Other | Admitting: Licensed Clinical Social Worker

## 2016-12-31 ENCOUNTER — Inpatient Hospital Stay (HOSPITAL_COMMUNITY): Payer: Medicare Other | Admitting: Physical Therapy

## 2016-12-31 ENCOUNTER — Inpatient Hospital Stay (HOSPITAL_COMMUNITY): Payer: Medicare Other | Admitting: Occupational Therapy

## 2016-12-31 DIAGNOSIS — F22 Delusional disorders: Secondary | ICD-10-CM

## 2016-12-31 DIAGNOSIS — F4312 Post-traumatic stress disorder, chronic: Secondary | ICD-10-CM

## 2016-12-31 DIAGNOSIS — I6930 Unspecified sequelae of cerebral infarction: Secondary | ICD-10-CM

## 2016-12-31 MED ORDER — QUETIAPINE FUMARATE 25 MG PO TABS
25.0000 mg | ORAL_TABLET | Freq: Two times a day (BID) | ORAL | Status: DC
  Administered 2016-12-31 – 2017-01-01 (×2): 25 mg via ORAL
  Filled 2016-12-31 (×2): qty 1

## 2016-12-31 NOTE — Plan of Care (Signed)
Problem: RH BLADDER ELIMINATION Goal: RH STG MANAGE BLADDER WITH ASSISTANCE STG Manage Bladder With min Assistance  Outcome: Not Progressing Max assist-incontinent at HS

## 2016-12-31 NOTE — Progress Notes (Signed)
Physical Therapy Session Note  Patient Details  Name: Perry Cervantes MRN: [id number] Date of Birth: Feb 21, 1951  Today's Date: 12/31/2016 PT Individual Time: 0906-1001 PT Individual Time Calculation (min): 55 min   Short Term Goals: Week 1:  PT Short Term Goal 1 (Week 1): pt will roll L with min assist and min cues for technique PT Short Term Goal 2 (Week 1): pt wil transfer squat pivot to L/R with min assist PT Short Term Goal 3 (Week 1): pt will propel w/c x 150' with close supervision PT Short Term Goal 4 (Week 1): pt will perform gait with LRAD x 10' , max assist of 1 person  Skilled Therapeutic Interventions/Progress Updates:  Pt received in bed with RN present, pt very upset he cannot locate his wife and RN & therapist provided support. Pt reports need to use restroom and transfers supine>sitting EOB with mod assist and hospital bed features. Pt transferred bed>toilet via stedy lift with +1 assist. Pt with continent BM on toilet and attempted to perform peri hygiene but required assistance to ensure cleanliness. Pt required assistance for donning brief then assistance to perform hand hygiene at sink to ensure L hand gets clean. Pt completes sit>stand from w/c while therapist assisted with donning pants. Pt able to brush sides & front of hair but required assistance to brush hair in the back. Pt propelled w/c room>dayroom with L hemi technique and supervision; pt with improved ability to steer with LLE. Pt utilized cybex kinetron in sitting with task focusing on BLE strengthening & R NMR. At end of session pt left sitting in w/c in room with QRB donned & all needs within reach.  Pt very impulsive and perseverative during session. Pt so with poor awareness of safety and RUE in space.   Therapy Documentation Precautions:  Precautions Precautions: Fall Precaution Comments: PTSD, verbose, angry at adult children who aren't helping his wife Restrictions Weight Bearing Restrictions:  No    See Function Navigator for Current Functional Status.   Therapy/Group: Individual Therapy  [name] 12/31/2016, 12:52 PM

## 2016-12-31 NOTE — Progress Notes (Signed)
PHYSICAL MEDICINE & REHABILITATION     PROGRESS NOTE    Subjective/Complaints: No major problems. Nervous about storm and various other issues at times. Incontinent at times  ROS: pt denies nausea, vomiting, diarrhea, cough, shortness of breath or chest pain   Objective: Vital Signs: Blood pressure 132/74, pulse (!) 59, temperature 98.3 F (36.8 C), temperature source Oral, resp. rate 18, height 5\' 9"  (1.753 m), weight 79.4 kg (175 lb 0.7 oz), SpO2 98 %. No results found. No results for input(s): WBC, HGB, HCT, PLT in the last 72 hours. No results for input(s): NA, K, CL, GLUCOSE, BUN, CREATININE, CALCIUM in the last 72 hours.  Invalid input(s): CO CBG (last 3)  No results for input(s): GLUCAP in the last 72 hours.  Wt Readings from Last 3 Encounters:  12/26/16 79.4 kg (175 lb 0.7 oz)  12/23/16 79.4 kg (175 lb)  12/22/16 79.4 kg (175 lb)    Physical Exam:  Constitutional: He is oriented to person, place, and time. He appears well-developed and well-nourished.  HENT:  Head: Normocephalic and atraumatic.  Eyes: EOM are normal. Right eye exhibits no discharge. Left eye exhibits no discharge.  Neck: supple.  Cardiovascular: RRR without murmur. No JVD   Respiratory: CTA Bilaterally without wheezes or rales. Normal effort    GI: Soft. Bowel sounds are normal. He exhibits no distension.  Musculoskeletal: He exhibits no edema or tenderness.  Neurological: He is alert and oriented to person, place, and time.  Motor: RUE; tr/5 proximal to distal--stable RLE: HF, KE 3+/5, ADF/PF 0/5--stable LUE/LLE: 5/5 proximal to distal DTRs 3+ RUE/RLE Tangential, continued perseverative behviors  Skin: Skin is warm and dry.  Scattered abrasions  Psychiatric: impulsive and irritable   Assessment/Plan: 1. 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 Physiatrist is providing close team supervision and 24 hour management of active medical problems listed below. Physiatrist and rehab team continue to assess barriers to discharge/monitor patient progress toward functional and medical goals.  Function:  Bathing Bathing position Bathing activity did not occur: Refused Position: Shower  Bathing parts Body parts bathed by patient: Right arm, Chest, Abdomen, Right upper leg, Left upper leg, Front perineal area Body parts bathed by helper: Right lower leg, Left lower leg, Buttocks, Left arm  Bathing assist Assist Level: Touching or steadying assistance(Pt > 75%)      Upper Body Dressing/Undressing Upper body dressing   What is the patient wearing?: Pull over shirt/dress     Pull over shirt/dress - Perfomed by patient: Thread/unthread left sleeve, Put head through opening Pull over shirt/dress - Perfomed by helper: Thread/unthread right sleeve, Thread/unthread left sleeve, Put head through opening, Pull shirt over trunk        Upper body assist Assist Level: Touching or steadying assistance(Pt > 75%)      Lower Body Dressing/Undressing Lower body dressing   What is the patient wearing?: Pants Underwear - Performed by patient: Thread/unthread left underwear leg Underwear - Performed by helper: Thread/unthread right underwear leg, Pull underwear up/down Pants- Performed by patient: Thread/unthread left pants leg Pants- Performed by helper: Thread/unthread right pants leg, Thread/unthread left pants leg, Pull pants up/down   Non-skid slipper socks- Performed by helper: Don/doff left sock, Don/doff right sock                  Lower body assist Assist for lower body dressing: Touching  or steadying assistance (Pt > 75%)      Toileting Toileting Toileting activity did not occur: No continent bowel/bladder event Toileting steps completed by patient: Adjust clothing prior to toileting Toileting steps completed by helper:  Performs perineal hygiene Toileting Assistive Devices: Other (comment) (stedy lift)  Toileting assist Assist level: Two helpers   Transfers Chair/bed transfer   Chair/bed transfer method: Stand pivot Chair/bed transfer assist level: Moderate assist (Pt 50 - 74%/lift or lower) Chair/bed transfer assistive device: Armrests     Locomotion Ambulation     Max distance: 5' Assist level: Maximal assist (Pt 25 - 49%)   Wheelchair   Type: Manual Max wheelchair distance: 100 ft Assist Level: Supervision or verbal cues  Cognition Comprehension Comprehension assist level: Follows basic conversation/direction with extra time/assistive device  Expression Expression assist level: Expresses basic needs/ideas: With extra time/assistive device  Social Interaction Social Interaction assist level: Interacts appropriately 50 - 74% of the time - May be physically or verbally inappropriate.  Problem Solving Problem solving assist level: Solves basic 50 - 74% of the time/requires cueing 25 - 49% of the time  Memory Memory assist level: Recognizes or recalls 50 - 74% of the time/requires cueing 25 - 49% of the time   Medical Problem List and Plan: 1.  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  DVT Prophylaxis/Anticoagulation: Subcutaneous heparin. Monitor platelet counts and any signs of bleeding 3. Pain Management/chronic back pain: Neurontin 300 mg 3 times a day, hydrocodone as needed 4. Mood/PTSD/paranoid personality disorder: Lexapro 20 mg daily, Seroquel 100 mg daily, Valium 10 mg TID  -home regimen is 10-10-20mg    -neuropsych input appreciated. Will add further seroquel to see if we can stem some of his paranoia/fixations    5. Neuropsych: This patient is not fully capable of making decisions on his own behalf. 6. Skin/Wound Care: Routine skin checks 7. Fluids/Electrolytes/Nutrition: encourage PO 8. Hypertension. Minipress 5 mg daily at bedtime,Ziac  10-6..25 mg daily. 9. Hyperlipidemia. Lipitor 10. Rhabdomyolysis. Follow-up chemistries. CK trending down 11. Left BBB with tachycardia. Echocardiogram reviewed per cardiology services troponin negative. No further plans for cardiac workup 12. Leukocytosis with possible pneumonia. Empiric Augmentin added 12/26/2016  -continue for 7 days  -recent wbc 7.1. 13. Tobacco abuse. Counseling as appropriate   LOS (Days) 5 A Baldwin T, MD 12/31/2016 3:07 PM

## 2016-12-31 NOTE — Progress Notes (Signed)
Occupational Therapy Session Note  Patient Details  Name: Perry Cervantes MRN: [id number] Date of Birth: September 24, 1950  Today's Date: 12/31/2016 OT Individual Time: 1415-1530 OT Individual Time Calculation (min): 75 min    Short Term Goals: Week 1:  OT Short Term Goal 1 (Week 1): Pt will perform toilet transfer with mod A in order to decrease level of assist with functional transfer.  OT Short Term Goal 2 (Week 1): Pt will perform LB dressing with mod A.  OT Short Term Goal 3 (Week 1): Pt will maining dynamic standing balance at mod A for toileting task.  Skilled Therapeutic Interventions/Progress Updates:    Upon entering the room, pt supine in bed and very verbose and upset about upcoming storm and wanting to be at home with Wife. OT continues to educate pt on current level of care and concerns regarding pt returning home at this time based on significant needs. Pt very tearful and verbalized he is upset with family who is not providing assistance for himself or his wife at this time. Pt continuing on with tangential speech that is difficult to follow. Pt transferred from bed >wheelchair with max A stand pivot. OT assisted pt outside to calm down. He appeared to feel better and returned to room. Pt declined remaining in wheelchair and transferred to bed in same manner as above. Bed alarm activated and call bell within reach. OT spoke with SW regarding pt concerns.   Therapy Documentation Precautions:  Precautions Precautions: Fall Precaution Comments: PTSD, verbose, angry at adult children who aren't helping his wife Restrictions Weight Bearing Restrictions: No General:   Vital Signs:  Pain: Pain Assessment Pain Score: 4  Pain Type: Acute pain Pain Location: Back ADL:   Vision   Perception    Praxis   Exercises:   Other Treatments:    See Function Navigator for Current Functional Status.   Therapy/Group: Individual Therapy  Gypsy Decant 12/31/2016, 4:25 PM
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
## 2016-12-31 NOTE — Progress Notes (Signed)
Perseverates at times. Continent of urine before nightly meds given.  incontinent during night. Doesn't call when wet. No attempts OOB. Slept all night.  Perry Cervantes A

## 2016-12-31 NOTE — Progress Notes (Signed)
Speech Language Pathology Daily Session Note  Patient Details  Name: Perry Cervantes MRN: [id number] Date of Birth: May 18, 1950  Today's Date: 12/31/2016 SLP Individual Time: 1300-1330 SLP Individual Time Calculation (min): 30 min  Short Term Goals: Week 1: SLP Short Term Goal 1 (Week 1): Pt will recall new, daily information with min assist verbal cues for use of external aids.   SLP Short Term Goal 2 (Week 1): Pt will return demonstration of at least 2 safety precautions wtih min assist verbal cues.   SLP Short Term Goal 3 (Week 1): Pt will recognize and correct errors in the moment during basic, functional tasks with min assist verbal cues.   SLP Short Term Goal 4 (Week 1): Pt will complete basic tasks with min assist verbal cues for functional problem solving.    Skilled Therapeutic Interventions: #1 Skilled ST services focused on cognitive skills. SLP facilitated recall and problem solving skills utilizing medication management. Pt demonstrated ability to recall medication piror to hospitalization and was independent in management per pt. SLP reviewed current medication names, dosage, times per day and reason for medication with pt utilizing visual aid. Pt demonstrated ability to read visual aid and draw conclusions about similar medication he took piror to hospitalization. Pt demonstrated ability to recall current medications with visual aid given Min A verbal cues and Mod A verbal cues to redirect to task. Pt was left in bed with call bell within reach.  #2 Skilled ST services focused on cognitive skills. SLP facilitated medication management utilizing visual aid to recall current medications. Pt demonstrated ability to answer questions about medications by category, time and number of day utilizing visual aid with Min A verbal cues. Pt demonstrated problem solving and self-correction skills during medication management tasks utilizing visual aid and mod-max A verbal and visual cues when  administrating medication x2 per day. Pt stated he would take medication prescribed x2 day a one time to save time, SLP educated pt on safety risk not following medication administration protocol, pt stated understanding. Pt left in room with call bell within reach. Recommend to continue ST services.       Function:  Cognition Comprehension Comprehension assist level: Follows basic conversation/direction with extra time/assistive device  Expression   Expression assist level: Expresses basic needs/ideas: With extra time/assistive device  Social Interaction Social Interaction assist level: Interacts appropriately 50 - 74% of the time - May be physically or verbally inappropriate.  Problem Solving Problem solving assist level: Solves basic 50 - 74% of the time/requires cueing 25 - 49% of the time  Memory Memory assist level: Recognizes or recalls 50 - 74% of the time/requires cueing 25 - 49% of the time    Pain Pain Assessment Pain Score: 4  Pain Type: Acute pain Pain Location: Back  Therapy/Group: Individual Therapy  Valoria Tamburri  [facility] 12/31/2016, 2:02 PM

## 2017-01-01 ENCOUNTER — Inpatient Hospital Stay (HOSPITAL_COMMUNITY): Payer: Medicare Other | Admitting: Physical Therapy

## 2017-01-01 ENCOUNTER — Inpatient Hospital Stay (HOSPITAL_COMMUNITY): Payer: Medicare Other | Admitting: Speech Pathology

## 2017-01-01 ENCOUNTER — Inpatient Hospital Stay (HOSPITAL_COMMUNITY): Payer: Medicare Other | Admitting: Occupational Therapy

## 2017-01-01 MED ORDER — QUETIAPINE FUMARATE 50 MG PO TABS
50.0000 mg | ORAL_TABLET | Freq: Two times a day (BID) | ORAL | Status: DC
  Administered 2017-01-01 – 2017-01-13 (×24): 50 mg via ORAL
  Filled 2017-01-01 (×24): qty 1
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
## 2017-01-01 NOTE — Plan of Care (Signed)
Problem: RH COGNITION-NURSING Goal: RH STG USES MEMORY AIDS/STRATEGIES W/ASSIST TO PROBLEM SOLVE STG Uses Memory Aids/Strategies With min Assistance to Problem Solve.  Outcome: Not Progressing Requires max cueing to problem solve rational decisions

## 2017-01-01 NOTE — Progress Notes (Signed)
Occupational Therapy Session Note  Patient Details  Name: Perry Cervantes MRN: [id number] Date of Birth: 1950/09/11  Today's Date: 01/01/2017 OT Individual Time: 6644-0347 OT Individual Time Calculation (min): 72 min    Short Term Goals: Week 1:  OT Short Term Goal 1 (Week 1): Pt will perform toilet transfer with mod A in order to decrease level of assist with functional transfer.  OT Short Term Goal 2 (Week 1): Pt will perform LB dressing with mod A.  OT Short Term Goal 3 (Week 1): Pt will maining dynamic standing balance at mod A for toileting task.  Skilled Therapeutic Interventions/Progress Updates:    Upon entering the room, pt seated in wheelchair and reports, " My wife is coming to get me. I am leaving here today. " OT providing pt with education regarding safety concerns but pt continues to report needing to leave with maximal cues for redirection for therapy session. OT assisted pt to ADL apartment for simulated transfer from wheelchair <> TTB with max A needed for safety. Pt returning to wheelchair and propelling self with hemiplegic technique and increased time. OT providing pt handout regarding R UE self ROM and providing demonstration for each exercise. Pt returned demonstration with min verbal guidance cues. Pt also provided information and paper handouts in regards to proper positioning of R UE in bed and when seated in wheelchair. Pt returned to room with quick release belt donned and call bell within reach upon exiting the room.   Therapy Documentation Precautions:  Precautions Precautions: Fall Precaution Comments: PTSD, verbose, angry at adult children who aren't helping his wife Restrictions Weight Bearing Restrictions: No General:   Vital Signs: Therapy Vitals Temp: 97.8 F (36.6 C) Temp Source: Oral Pulse Rate: 70 Resp: 16 BP: 110/65 Patient Position (if appropriate): Lying Oxygen Therapy SpO2: 100 % O2 Device: Not Delivered Pain: Pain Assessment Pain  Assessment: 0-10 Pain Score: 8  Pain Type: Acute pain Pain Location: Hip Pain Descriptors / Indicators: Aching;Sore Pain Frequency: Occasional Pain Onset: Gradual Patients Stated Pain Goal: 2 Pain Intervention(s): Medication (See eMAR) (tylenol 650 mgpo)  See Function Navigator for Current Functional Status.   Therapy/Group: Individual Therapy  Gypsy Decant 01/01/2017, 4:52 PM
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
## 2017-01-01 NOTE — Progress Notes (Signed)
Physical Therapy Session Note  Patient Details  Name: Perry Cervantes MRN: [id number] Date of Birth: 08/06/1950  Today's Date: 01/01/2017 PT Individual Time: 1400-1425 PT Individual Time Calculation (min): 25 min   Short Term Goals: Week 1:  PT Short Term Goal 1 (Week 1): pt will roll L with min assist and min cues for technique PT Short Term Goal 2 (Week 1): pt wil transfer squat pivot to L/R with min assist PT Short Term Goal 3 (Week 1): pt will propel w/c x 150' with close supervision PT Short Term Goal 4 (Week 1): pt will perform gait with LRAD x 10' , max assist of 1 person  Skilled Therapeutic Interventions/Progress Updates:  Pt received in w/c reporting hip pain & RN aware. Pt propelled w/c around nurses station with L hemi technique and min assist. Pt requires frequent cuing to sit back in chair as he demonstrated significant anterior lean. Pt transferred w/c>bed via stand pivot with +2 assist (pt = 40%). Pt requires multimodal cuing and facilitation for weight shifting. Pt requires +2 assist for sit>supine; pt unable to use LLE to assist RLE up onto bed despite cuing. Throughout session pt very lethargic and RN aware. At end of session pt left in bed with RN present & all needs within reach.  Therapy Documentation Precautions:  Precautions Precautions: Fall Precaution Comments: PTSD, verbose, angry at adult children who aren't helping his wife Restrictions Weight Bearing Restrictions: No   See Function Navigator for Current Functional Status.   Therapy/Group: Individual Therapy  [name] 01/01/2017, 3:35 PM
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
## 2017-01-02 ENCOUNTER — Inpatient Hospital Stay (HOSPITAL_COMMUNITY): Payer: Medicare Other | Admitting: Occupational Therapy

## 2017-01-02 ENCOUNTER — Inpatient Hospital Stay (HOSPITAL_COMMUNITY): Payer: Self-pay | Admitting: Physical Therapy

## 2017-01-02 ENCOUNTER — Inpatient Hospital Stay (HOSPITAL_COMMUNITY): Payer: Self-pay

## 2017-01-02 DIAGNOSIS — F22 Delusional disorders: Secondary | ICD-10-CM

## 2017-01-02 NOTE — Progress Notes (Signed)
Occupational Therapy Session Note  Patient Details  Name: Perry Cervantes MRN: [id number] Date of Birth: 1950-09-08  Today's Date: 01/02/2017 OT Individual Time: 1962-2297 OT Individual Time Calculation (min): 58 min    Short Term Goals: Week 1:  OT Short Term Goal 1 (Week 1): Pt will perform toilet transfer with mod A in order to decrease level of assist with functional transfer.  OT Short Term Goal 2 (Week 1): Pt will perform LB dressing with mod A.  OT Short Term Goal 3 (Week 1): Pt will maining dynamic standing balance at mod A for toileting task.  Skilled Therapeutic Interventions/Progress Updates:    Upon entering the room, pt supine in bed eating breakfast with set up A needed to open containers. Pt reports feeling better today. However, pt making several phone calls to wife this session to request several items with attempts to redirect. Pt also declining exiting the bed until he received medication which RN was able to provide this session. While Pt eating, therapist continued education regarding safety of R UE . Pt performed self ROM for wrist flex/ext, digits flex/ext, and elbow flex/ext. Pt declined assistance with LB self care this session and requested male assistance for task. Pt able to thread L LE into pants and NT assisting pt with R LE and pulling over B hips. Pt remained in bed at end of session with call bell and all needed items within reach.    Therapy Documentation Precautions:  Precautions Precautions: Fall Precaution Comments: PTSD, verbose, angry at adult children who aren't helping his wife Restrictions Weight Bearing Restrictions: No General:   Vital Signs: Therapy Vitals Temp: 98.1 F (36.7 C) Temp Source: Oral Pulse Rate: 60 Resp: 16 BP: 116/69 Patient Position (if appropriate): Lying Oxygen Therapy SpO2: 100 % O2 Device: Not Delivered Pain: Pain Assessment Pain Assessment: Faces Faces Pain Scale: Hurts a little bit ADL:   Vision    Perception    Praxis   Exercises:   Other Treatments:    See Function Navigator for Current Functional Status.   Therapy/Group: Individual Therapy  Gypsy Decant 01/02/2017, 8:59 AM

## 2017-01-02 NOTE — Progress Notes (Signed)
Speech Language Pathology Daily Session Note  Patient Details  Name: Perry Cervantes MRN: [id number] Date of Birth: 04-Aug-1950  Today's Date: 01/02/2017 SLP Individual Time: 1300-1400 SLP Individual Time Calculation (min): 60 min  Short Term Goals: Week 1: SLP Short Term Goal 1 (Week 1): Pt will recall new, daily information with min assist verbal cues for use of external aids.   SLP Short Term Goal 2 (Week 1): Pt will return demonstration of at least 2 safety precautions wtih min assist verbal cues.   SLP Short Term Goal 3 (Week 1): Pt will recognize and correct errors in the moment during basic, functional tasks with min assist verbal cues.   SLP Short Term Goal 4 (Week 1): Pt will complete basic tasks with min assist verbal cues for functional problem solving.    Skilled Therapeutic Interventions: Skilled ST services focused on cognition skills. SLP facilitated medication management tasks to address semi-complex problem solving skills, pt demonstrated ability to fill medication box with Min A verbal cues and visual aid for recall of dosage and required Min a questions cues in error recognition and then self-correction. Pt demonstrated Mod impairment in safety recall, " stating he was fine to get up in the room" , however following education agreed he must call for help. Pt was left in bed with call bell within reach. Recommend to continue ST services.      Function:  Cognition Comprehension Comprehension assist level: Follows basic conversation/direction with extra time/assistive device  Expression   Expression assist level: Expresses basic needs/ideas: With extra time/assistive device  Social Interaction Social Interaction assist level: Interacts appropriately 50 - 74% of the time - May be physically or verbally inappropriate.  Problem Solving Problem solving assist level: Solves basic 50 - 74% of the time/requires cueing 25 - 49% of the time  Memory Memory assist level: Recognizes  or recalls 50 - 74% of the time/requires cueing 25 - 49% of the time    Pain Pain Assessment Pain Assessment: No/denies pain  Therapy/Group: Individual Therapy  Sallye Lunz  [facility] 01/02/2017, 4:04 PM

## 2017-01-02 NOTE — Progress Notes (Signed)
Physical Therapy Session Note  Patient Details  Name: Perry Cervantes MRN: [id number] Date of Birth: 1950/05/01  Today's Date: 01/02/2017 PT Individual Time: 0900-1015 PT Individual Time Calculation (min): 75 min   Short Term Goals: Week 1:  PT Short Term Goal 1 (Week 1): pt will roll L with min assist and min cues for technique PT Short Term Goal 2 (Week 1): pt wil transfer squat pivot to L/R with min assist PT Short Term Goal 3 (Week 1): pt will propel w/c x 150' with close supervision PT Short Term Goal 4 (Week 1): pt will perform gait with LRAD x 10' , max assist of 1 person  Skilled Therapeutic Interventions/Progress Updates:   Pt received sitting in WC and agreeable to PT WC mobility instructed by PT with Hemi technique and supervision-min assist from PT x 110f, 752f and 10041fMin cues for turning technique and safety in crowded room. .   PT instructed pt in Car transfer and mod assist for safety and min cues for set up and sequencing.   PT instructed pt in Gait training at rail in hall with Max +2 [facility]. Max assist to advance the RLE was well as stabilize knee in neutral position to prevent Genu recurvatum. Constant cues also required to stabilize the trunk and maintain erect posture  Nustep reciprocal movement training with RUE and RLE support. 2x5 min level 1-3 with moderate cues from PT for improved posture, trunk rotation, and scapular movement.   Sit<>stand in parallel bars with max assist from PT x 5 minisquats in parallel bars 3 x 8 with max progressing to Mod assist from PT.  PT required to control the RLE and RUE to prevent lateral LOB to the R.    Patient returned too room and [facility] in reach and all needs met.        Therapy Documentation Precautions:  Precautions Precautions: Fall Precaution Comments: PTSD, verbose, angry at adult children who aren't helping his wife Restrictions Weight Bearing Restrictions:  No Pain: 0/10   See Function Navigator for Current Functional Status.   Therapy/Group: Individual Therapy  AusLorie Phenix12/2018, 2:02 PM

## 2017-01-02 NOTE — Progress Notes (Signed)
[place] PHYSICAL MEDICINE & REHABILITATION     PROGRESS NOTE    Subjective/Complaints: Awake, eating breakfast. Apologetic for behavior. Wants to get better, work hard  ROS: pt denies nausea, vomiting, diarrhea, cough, shortness of breath or chest pain     Objective: Vital Signs: Blood pressure 116/69, pulse 60, temperature 98.1 F (36.7 C), temperature source Oral, resp. rate 16, height 5\' 9"  (1.753 m), weight 79.6 kg (175 lb 7.1 oz), SpO2 100 %. No results found. No results for input(s): WBC, HGB, HCT, PLT in the last 72 hours. No results for input(s): NA, K, CL, GLUCOSE, BUN, CREATININE, CALCIUM in the last 72 hours.  Invalid input(s): CO CBG (last 3)  No results for input(s): GLUCAP in the last 72 hours.  Wt Readings from Last 3 Encounters:  01/02/17 79.6 kg (175 lb 7.1 oz)  12/23/16 79.4 kg (175 lb)  12/22/16 79.4 kg (175 lb)    Physical Exam:  Constitutional: He is oriented to person, place, and time. He appears well-developed and well-nourished.  HENT:  Head: Normocephalic and atraumatic.  Eyes: EOM are normal. Right eye exhibits no discharge. Left eye exhibits no discharge.  Neck: supple.  Cardiovascular: RRR without murmur. No JVD     Respiratory: CTA Bilaterally without wheezes or rales. Normal effort   GI: Soft. Bowel sounds are normal. He exhibits no distension.  Musculoskeletal: He exhibits no edema or tenderness.  Neurological: He is alert and oriented to person, place, and time.  Motor: RUE; 1 to 2-/5 proximal , 0/5 wrist and HI---stable RLE: HF, KE 3+/5, ADF/PF 0 to trace/5-- stable LUE/LLE: 5/5 proximal to distal DTRs 3+ RUE/RLE Skin: Skin is warm and dry.  Scattered abrasions present Psychiatric: calm and appropriate today. A little emotional   Assessment/Plan: 1. 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 Physiatrist is providing close team supervision and 24 hour management of active medical problems listed below. Physiatrist and rehab team continue to assess barriers to discharge/monitor patient progress toward functional and medical goals.  Function:  Bathing Bathing position Bathing activity did not occur: Refused Position: Shower  Bathing parts Body parts bathed by patient: Right arm, Chest, Abdomen, Right upper leg, Left upper leg, Front perineal area Body parts bathed by helper: Right lower leg, Left lower leg, Buttocks, Left arm  Bathing assist Assist Level: Touching or steadying assistance(Pt > 75%)      Upper Body Dressing/Undressing Upper body dressing   What is the patient wearing?: Pull over shirt/dress     Pull over shirt/dress - Perfomed by patient: Thread/unthread left sleeve, Put head through opening Pull over shirt/dress - Perfomed by helper: Thread/unthread right sleeve, Thread/unthread left sleeve, Put head through opening, Pull shirt over trunk        Upper body assist Assist Level: Touching or steadying assistance(Pt > 75%)      Lower Body Dressing/Undressing Lower body dressing   What is the patient wearing?: Pants Underwear - Performed by patient: Thread/unthread left underwear leg Underwear - Performed by helper: Thread/unthread right underwear leg, Pull underwear up/down Pants- Performed by patient: Thread/unthread left pants leg Pants- Performed by helper: Thread/unthread right pants leg, Pull pants up/down, Fasten/unfasten pants   Non-skid slipper socks- Performed by helper: Don/doff left sock, Don/doff right sock                  Lower body assist Assist for  lower body dressing:  (max A)      Toileting Toileting Toileting activity did not occur: No continent bowel/bladder event Toileting steps completed by patient: Adjust clothing prior to toileting Toileting steps completed by helper: Adjust clothing prior to toileting, Performs  perineal hygiene, Adjust clothing after toileting Toileting Assistive Devices: Other (comment) (stedy lift)  Toileting assist Assist level: Two helpers   Transfers Chair/bed transfer   Chair/bed transfer method: Stand pivot Chair/bed transfer assist level: 2 helpers Chair/bed transfer assistive device: Armrests     Locomotion Ambulation     Max distance: 5' Assist level: Maximal assist (Pt 25 - 49%)   Wheelchair   Type: Manual Max wheelchair distance: 150 ft Assist Level: Touching or steadying assistance (Pt > 75%)  Cognition Comprehension Comprehension assist level: Follows basic conversation/direction with extra time/assistive device  Expression Expression assist level: Expresses basic needs/ideas: With extra time/assistive device  Social Interaction Social Interaction assist level: Interacts appropriately 50 - 74% of the time - May be physically or verbally inappropriate.  Problem Solving Problem solving assist level: Solves basic 50 - 74% of the time/requires cueing 25 - 49% of the time  Memory Memory assist level: Recognizes or recalls 50 - 74% of the time/requires cueing 25 - 49% of the time   Medical Problem List and Plan: 1.  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  DVT Prophylaxis/Anticoagulation: Subcutaneous heparin. Monitor platelet counts and any signs of bleeding 3. Pain Management/chronic back pain: Neurontin 300 mg 3 times a day, hydrocodone as needed 4. Mood/PTSD/paranoid personality disorder: Lexapro 20 mg daily, Seroquel 100 mg daily, Valium 10 mg TID  -home regimen is 10-10-20mg    -neuropsych input appreciated.   -titrate seroquel for paranoid, agitated behavior   -pt currently on 50-50-100mg  schedule currently which seems to have helped   5. Neuropsych: This patient is not fully capable of making decisions on his own behalf. 6. Skin/Wound Care: Routine skin checks 7. Fluids/Electrolytes/Nutrition: encourage  PO 8. Hypertension. Minipress 5 mg daily at bedtime,Ziac 10-6..25 mg daily. 9. Hyperlipidemia. Lipitor 10. Rhabdomyolysis. Follow-up chemistries. CK trending down 11. Left BBB with tachycardia. Echocardiogram reviewed per cardiology services troponin negative. No further plans for cardiac workup 12. Leukocytosis with possible pneumonia. Empiric Augmentin added 12/26/2016  -continue for 7 days---completed  -recent wbc 7.1. 13. Tobacco abuse. Counseling as appropriate   LOS (Days) 7 A FACE TO FACE EVALUATION WAS PERFORMED  Meredith Staggers, MD 01/02/2017 12:43 PM

## 2017-01-03 ENCOUNTER — Inpatient Hospital Stay (HOSPITAL_COMMUNITY): Payer: Medicare Other | Admitting: Occupational Therapy

## 2017-01-03 ENCOUNTER — Inpatient Hospital Stay (HOSPITAL_COMMUNITY): Payer: Medicare Other | Admitting: Physical Therapy

## 2017-01-03 ENCOUNTER — Ambulatory Visit (HOSPITAL_COMMUNITY): Payer: Self-pay | Admitting: Psychiatry

## 2017-01-03 ENCOUNTER — Inpatient Hospital Stay (HOSPITAL_COMMUNITY): Payer: Self-pay

## 2017-01-03 ENCOUNTER — Inpatient Hospital Stay (HOSPITAL_COMMUNITY): Payer: Medicare Other | Admitting: Speech Pathology

## 2017-01-03 ENCOUNTER — Inpatient Hospital Stay (HOSPITAL_COMMUNITY): Payer: Self-pay | Admitting: Physical Therapy

## 2017-01-03 NOTE — Patient Care Conference (Signed)
Inpatient RehabilitationTeam Conference and Plan of Care Update Date: 01/01/2017   Time: 2:45 PM    Patient Name: Perry Cervantes      Medical Record Number: [id number]  Date of Birth: 1950/10/01 Sex: Male         Room/Bed: 4W13C/4W13C-01 Payor Info: Payor: MEDICARE / Plan: MEDICARE PART A AND B / Product Type: *No Product type* /    Admitting Diagnosis: L CVA  Admit Date/Time:  12/26/2016  4:29 PM Admission Comments: No comment available   Primary Diagnosis:  Sequela of thrombotic cerebrovascular accident (CVA) Principal Problem: Sequela of thrombotic cerebrovascular accident (CVA)  Patient Active Problem List   Diagnosis Date Noted  . Paranoid behavior ([place]) 01/02/2017  . Sequela of thrombotic cerebrovascular accident (CVA)   . Thrombotic cerebral infarction ([place]) 12/26/2016  . Right hemiparesis ([place])   . 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 Neuropathic pain   . Adjustment disorder with mixed anxiety and depressed mood   . Benign essential HTN   . Hyperlipidemia   . Left bundle branch block   . Leukocytosis   . Tobacco abuse   . CVA (cerebral vascular accident) ([place]) 12/24/2016  . Chronic post-traumatic stress disorder (PTSD) 12/23/2016  . Depression 12/23/2016  . Chronic back pain 12/23/2016  . Right sided weakness 12/23/2016  . AKI (acute kidney injury) ([place]) 12/23/2016  . Rhabdomyolysis 12/23/2016  . Lumbar scoliosis 05/14/2013  . Heme positive stool 03/24/2012  . GERD (gastroesophageal reflux disease) 03/24/2012    Expected Discharge Date: Expected Discharge Date: 01/15/17 (vs. SNF)  Team Members Present: Physician leading conference: Dr. Alger Simons Social Worker Present: Lennart Pall, LCSW Nurse Present: Heather Roberts, RN PT Present: Lavone Nian, PT OT Present: Benay Pillow, OT SLP Present: Weston Anna, SLP PPS Coordinator present : Ileana Ladd, PT     Current Status/Progress Goal  Weekly Team Focus  Medical   left CVA, PTSD with depression/severe paranoia  reduce agitation and paranoia  medication adjustment, education for team,    Bowel/Bladder   Continent B/B with occassional incontinent episodes at HS. LBM 9/11  Continent of B/B  Maintain continence on a daily basis   Swallow/Nutrition/ Hydration             ADL's   max A overall functional transers and self care, pt is very impulsive  supervision - min A  pt education/NMR, balance, self care, safety awareness   Mobility   mod assist bed mobility, transfers, working on pre-gait activities  supervision overall  pt education, transfers, NMR, balance, safety awareness   Communication             Safety/Cognition/ Behavioral Observations  Mod-Max A  Supervision  problem solving, recall, awareness    Pain   Hip pain, 650mg  Tylenol q 6hr prn  <4 on a 0-10 pain scale  assess pain q 4hr and prn   Skin   Scattered bruising and skin tears to the right elbow (foam dressing), right wrist (tegaderm applied), just below Left knee (foam applied), on left knee (gauze applied)  No new skin breakdown or tears while on rehab.  assess skin q shift and prn    Rehab Goals Patient on target to meet rehab goals: Yes *See Care Plan and progress notes for long and short-term goals.     Barriers to Discharge  Current Status/Progress Possible Resolutions Date Resolved   Physician    Medication compliance  behavior  adjusting regimen, neuro-psyh intervention, family ed      Nursing  Decreased caregiver support;Medical stability;Home environment access/layout;Medication compliance;Lack of/limited family support;Other (comments) (Patient stated he will continue to use tobacco.)               PT  Decreased caregiver support  pt reports his wife recently d/c from hospital              OT Decreased caregiver support;Lack of/limited family support                SLP                SW                Discharge  Planning/Teaching Needs:  Pt goal is to d/c home with wife who can only provide supervision.  Concern that he may need more assistance which could mean a change of plan to SNF.  Teaching to be completed prior to d/c.   Team Discussion:  Behavioral issues - calmer this afternoon with medication adjustments.  Hopeful this will continue to be smoother and not a barrier to tx sessions.  Currently mod-max assist with transfers;  Attempting pre-gait.  Pt with very poor awareness of his limitations.  Concern that wife may not be able to manage level of assist he will need at home.  Will begin encouraging her involvement next week.  Revisions to Treatment Plan:  None    Continued Need for Acute Rehabilitation Level of Care: The patient requires daily medical management by a physician with specialized training in physical medicine and rehabilitation for the following conditions: Daily direction of a multidisciplinary physical rehabilitation program to ensure safe treatment while eliciting the highest outcome that is of practical value to the patient.: Yes Daily medical management of patient stability for increased activity during participation in an intensive rehabilitation regime.: Yes Daily analysis of laboratory values and/or radiology reports with any subsequent need for medication adjustment of medical intervention for : Mood/behavior problems;Neurological problems  Perry Cervantes 01/04/2017, 9:22 AM

## 2017-01-03 NOTE — Progress Notes (Signed)
Occupational Therapy Session Note  Patient Details  Name: Perry Cervantes MRN: [id number] Date of Birth: 09-14-1950  Today's Date: 01/03/2017 OT Individual Time: 1000-1100 OT Individual Time Calculation (min): 60 min    Short Term Goals: Week 1:  OT Short Term Goal 1 (Week 1): Pt will perform toilet transfer with mod A in order to decrease level of assist with functional transfer.  OT Short Term Goal 2 (Week 1): Pt will perform LB dressing with mod A.  OT Short Term Goal 3 (Week 1): Pt will maining dynamic standing balance at mod A for toileting task.  Skilled Therapeutic Interventions/Progress Updates:    Pt resting in w/c upon arrival.  Pt became verbally agitated when therapist suggested bathing/dressing tasks and shower.  Pt provided emotional support and pt agreeable to engaging in therapy.  Focus on LUE NMR and table activities and stool activities.  Pt initially engaged in LUE weight bearing followed by stool activities with emphasis on closed chain shoulder flexion/extension.  Pt transitioned to table tasks with LUE with emphasis on internal/external rotation.  Trace biceps/triceps activity noted.  Pt returned to room and requested to return to bed.  Max A SPT to bed.  Pt remained in bed with all needs within reach and bed alarm activated.   Therapy Documentation Precautions:  Precautions Precautions: Fall Precaution Comments: PTSD, verbose, angry at adult children who aren't helping his wife Restrictions Weight Bearing Restrictions: No Pain:  Pt denies pain  See Function Navigator for Current Functional Status.   Therapy/Group: Individual Therapy  Leroy Libman 01/03/2017, 3:07 PM

## 2017-01-03 NOTE — Progress Notes (Signed)
Speech Language Pathology Daily Session Note  Patient Details  Name: Perry Cervantes MRN: [id number] Date of Birth: 1950-10-15  Today's Date: 01/03/2017 SLP Individual Time: 1430-1500 SLP Individual Time Calculation (min): 30 min  Short Term Goals: Week 1: SLP Short Term Goal 1 (Week 1): Pt will recall new, daily information with min assist verbal cues for use of external aids.   SLP Short Term Goal 2 (Week 1): Pt will return demonstration of at least 2 safety precautions wtih min assist verbal cues.   SLP Short Term Goal 3 (Week 1): Pt will recognize and correct errors in the moment during basic, functional tasks with min assist verbal cues.   SLP Short Term Goal 4 (Week 1): Pt will complete basic tasks with min assist verbal cues for functional problem solving.    Skilled Therapeutic Interventions: Skilled treatment session focused on cognitive goals. SLP facilitated session by providing supervision-Min A verbal cues for recall of events from previous therapy sessions. Patient demonstrates increased awareness of physical deficits but continues to require overall Max-Total A for anticipatory awareness in regards to d/c planning and activities he plans on doing upon discharge home. Throughout session, patient required Mod A verbal cues to attend to RUE throughout session with word-finding deficits noted. However, patient denied difficulty. Patient left upright in wheelchair with quick release belt in place and RN present. Continue with current plan of care.      Function:   Cognition Comprehension Comprehension assist level: Follows basic conversation/direction with extra time/assistive device  Expression   Expression assist level: Expresses basic needs/ideas: With extra time/assistive device  Social Interaction Social Interaction assist level: Interacts appropriately 50 - 74% of the time - May be physically or verbally inappropriate.  Problem Solving Problem solving assist level: Solves  basic 50 - 74% of the time/requires cueing 25 - 49% of the time  Memory Memory assist level: Recognizes or recalls 50 - 74% of the time/requires cueing 25 - 49% of the time    Pain No/Denies Pain   Therapy/Group: Individual Therapy  Bentlie Withem, [place] 01/03/2017, 3:03 PM

## 2017-01-03 NOTE — Progress Notes (Signed)
Physical Therapy Session Note  Patient Details  Name: Perry Cervantes MRN: [id number] Date of Birth: 1950/07/13  Today's Date: 01/03/2017 PT Individual Time: 1620-1700 PT Individual Time Calculation (min): 40 min   Short Term Goals: Week 2:  PT Short Term Goal 1 (Week 2): Pt will transfer bed<>chair with min assist +1. PT Short Term Goal 2 (Week 2): Pt will complete bed mobility with supervision overall. PT Short Term Goal 3 (Week 2): Pt will ambulate 10 ft with LRAD & max assist +1.  Skilled Therapeutic Interventions/Progress Updates:   Pt in w/c upon arrival and agreeable to therapy, no c/o pain. Worked on w/c mobility and fitting this session as pt did c/o of "never sitting right" in his w/c. Pt self-propelled w/c to/from therapy gym, >300' ft each way, w/ supervision and verbal cues for obstacle avoidance. Trial-ed multiple different w/c cushions while performing multiple sit<>stands at rail in hallway. Goal w/ new cushion was to allow pt not to slide in w/c and sitting tolerance for intermittent hip pain w/ sitting. Pt performing sit<>stands w/ Max A to block R knee and multimodal cues for posture and technique. Ended session in w/c, call bell within reach and all needs met.   Therapy Documentation Precautions:  Precautions Precautions: Fall Precaution Comments: PTSD, verbose, angry at adult children who aren't helping his wife Restrictions Weight Bearing Restrictions: No Pain: Pain Assessment Pain Assessment: No/denies pain  See Function Navigator for Current Functional Status.   Therapy/Group: Individual Therapy  Debroh Sieloff K Arnette 01/03/2017, 8:25 PM

## 2017-01-03 NOTE — Progress Notes (Signed)
Occupational Therapy Session Note  Patient Details  Name: Perry Cervantes MRN: [id number] Date of Birth: 1950-06-27  Today's Date: 01/03/2017 OT Individual Time: 1330-1430 OT Individual Time Calculation (min): 60 min    Short Term Goals: Week 1:  OT Short Term Goal 1 (Week 1): Pt will perform toilet transfer with mod A in order to decrease level of assist with functional transfer.  OT Short Term Goal 2 (Week 1): Pt will perform LB dressing with mod A.  OT Short Term Goal 3 (Week 1): Pt will maining dynamic standing balance at mod A for toileting task. Week 2:     Skilled Therapeutic Interventions/Progress Updates:    Upon entering the room, pt supine in room with no c/o pain and agreeable to OT intervention. Mod A supine >sit to EOB. Pt required max A stand pivot transfer to wheelchair. Pt propelled wheelchair with hemiplegic technique to day room. Sit <>stand with R knee blocked and therapist positioned in front of him with manual facilitation for upright posture and weight shift. Pt standing 2 reps for 1 minute each and mirror utilized for visual feedback for midline orientation. Pt seated with focus on L UE NMR for shoulder flexion with trace bicep and tricep noted. Pt fatigued easily with task. Pt returning to room and requesting to toilet. Assistance need for hygiene and clothing management. Max stand pivot onto toilet. Close supervision for balance while pt seated and holding onto grab bar. Mod A standing balance while therapist assisted with toileting. Pt returning to wheelchair with quick release belt donned and  R UE placed onto lap tray. Call bell within reach upon exiting the room.   Therapy Documentation Precautions:  Precautions Precautions: Fall Precaution Comments: PTSD, verbose, angry at adult children who aren't helping his wife Restrictions Weight Bearing Restrictions: No General:   Vital Signs: Therapy Vitals Temp: 98.2 F (36.8 C) Temp Source: Oral Pulse Rate:  72 Resp: 18 BP: 119/65 Patient Position (if appropriate): Sitting Oxygen Therapy SpO2: 100 % O2 Device: Not Delivered Pain: Pain Assessment Pain Assessment: No/denies pain ADL:   Vision   Perception    Praxis   Exercises:   Other Treatments:    See Function Navigator for Current Functional Status.   Therapy/Group: Individual Therapy  Gypsy Decant 01/03/2017, 5:45 PM
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
## 2017-01-03 NOTE — Progress Notes (Signed)
[facility]IT[facility]     PROGRESS NOTE    Subjective/Complaints: No new complaints. Had a good day with therapies. Slept well  ROS: pt denies nausea, vomiting, diarrhea, cough, shortness of breath or chest pain     Objective: Vital Signs: Blood pressure 115/77, pulse 66, temperature 97.8 F (36.6 C), temperature source Oral, resp. rate 18, height 5\' 9"  (1.753 m), weight 79.6 kg (175 lb 7.1 oz), SpO2 100 %. No results found. No results for input(s): WBC, HGB, HCT, PLT in the last 72 hours. No results for input(s): NA, K, CL, GLUCOSE, BUN, CREATININE, CALCIUM in the last 72 hours.  Invalid input(s): CO CBG (last 3)  No results for input(s): GLUCAP in the last 72 hours.  Wt Readings from Last 3 Encounters:  01/02/17 79.6 kg (175 lb 7.1 oz)  12/23/16 79.4 kg (175 lb)  12/22/16 79.4 kg (175 lb)    Physical Exam:  Constitutional: He is oriented to person, place, and time. He appears well-developed and well-nourished.  HENT:  Head: Normocephalic and atraumatic.  Eyes: EOM are normal. Right eye exhibits no discharge. Left eye exhibits no discharge.  Neck: supple.  Cardiovascular: RRR without murmur. No JVD      Respiratory: CTA Bilaterally without wheezes or rales. Normal effort   GI: Soft. Bowel sounds are normal. He exhibits no distension.  Musculoskeletal: He exhibits no edema or tenderness.  Neurological: He is alert and oriented to person, place, and time.  Motor: RUE;  2-/5 proximal , 0/5 wrist and HI RLE: HF, KE 3+/5, ADF/PF 0 to trace/5  LUE/LLE: 5/5 proximal to distal DTRs 3+ RUE/RLE Skin: Skin is warm and dry.  Scattered abrasions present Psychiatric: calm and appropriate today. A little emotional   Assessment/Plan: 1. 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 Physiatrist is providing close team  supervision and 24 hour management of active medical problems listed below. Physiatrist and rehab team continue to assess barriers to discharge/monitor patient progress toward functional and medical goals.  Function:  Bathing Bathing position Bathing activity did not occur: Refused Position: Shower  Bathing parts Body parts bathed by patient: Right arm, Chest, Abdomen, Right upper leg, Left upper leg, Front perineal area Body parts bathed by helper: Right lower leg, Left lower leg, Buttocks, Left arm  Bathing assist Assist Level: Touching or steadying assistance(Pt > 75%)      Upper Body Dressing/Undressing Upper body dressing   What is the patient wearing?: Pull over shirt/dress     Pull over shirt/dress - Perfomed by patient: Thread/unthread left sleeve, Put head through opening Pull over shirt/dress - Perfomed by helper: Thread/unthread right sleeve, Thread/unthread left sleeve, Put head through opening, Pull shirt over trunk        Upper body assist Assist Level: Touching or steadying assistance(Pt > 75%)      Lower Body Dressing/Undressing Lower body dressing   What is the patient wearing?: Pants Underwear - Performed by patient: Thread/unthread left underwear leg Underwear - Performed by helper: Thread/unthread right underwear leg, Pull underwear up/down Pants- Performed by patient: Thread/unthread left pants leg Pants- Performed by helper: Thread/unthread right pants leg, Pull pants up/down, Fasten/unfasten pants   Non-skid slipper socks- Performed by helper: Don/doff left sock, Don/doff right sock                  Lower body assist Assist for lower  body dressing:  (max A)      Toileting Toileting Toileting activity did not occur: No continent bowel/bladder event Toileting steps completed by patient: Adjust clothing prior to toileting Toileting steps completed by helper: Adjust clothing prior to toileting, Performs perineal hygiene, Adjust clothing after  toileting Toileting Assistive Devices: Other (comment) (stedy lift)  Toileting assist Assist level: Two helpers   Transfers Chair/bed transfer   Chair/bed transfer method: Stand pivot Chair/bed transfer assist level: Moderate assist (Pt 50 - 74%/lift or lower) Chair/bed transfer assistive device: Armrests     Locomotion Ambulation     Max distance: 25 Assist level: 2 helpers   Wheelchair   Type: Manual Max wheelchair distance: 150 Assist Level: Touching or steadying assistance (Pt > 75%)  Cognition Comprehension Comprehension assist level: Follows basic conversation/direction with extra time/assistive device  Expression Expression assist level: Expresses basic needs/ideas: With extra time/assistive device  Social Interaction Social Interaction assist level: Interacts appropriately 50 - 74% of the time - May be physically or verbally inappropriate.  Problem Solving Problem solving assist level: Solves basic 50 - 74% of the time/requires cueing 25 - 49% of the time  Memory Memory assist level: Recognizes or recalls 50 - 74% of the time/requires cueing 25 - 49% of the time   Medical Problem List and Plan: 1.  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  DVT Prophylaxis/Anticoagulation: Subcutaneous heparin. Monitor platelet counts and any signs of bleeding 3. Pain Management/chronic back pain: Neurontin 300 mg 3 times a day, hydrocodone as needed 4. Mood/PTSD/paranoid personality disorder: Lexapro 20 mg daily, Seroquel 100 mg daily, Valium 10 mg TID  -home regimen is 10-10-20mg    -neuropsych input appreciated.   - seroquel for paranoid, agitated behavior   -pt currently on 50-50-100mg  schedule currently which seems to have helped   5. Neuropsych: This patient is not fully capable of making decisions on his own behalf. 6. Skin/Wound Care: Routine skin checks 7. Fluids/Electrolytes/Nutrition: encourage PO 8. Hypertension. Minipress 5 mg  daily at bedtime,Ziac 10-6..25 mg daily. 9. Hyperlipidemia. Lipitor 10. Rhabdomyolysis.   CK trending down 11. Left BBB with tachycardia. Echocardiogram reviewed per cardiology services troponin negative. No further plans for cardiac workup 12. Leukocytosis with possible pneumonia. Empiric Augmentin added 12/26/2016, completed    13. Tobacco abuse. Counseling as appropriate   LOS (Days) Belton T, MD 01/03/2017 9:29 AM

## 2017-01-03 NOTE — Progress Notes (Signed)
Social Work Patient ID: Perry Cervantes, male   DOB: March 10, 1951, 66 y.o.   MRN: [id number]   Have reviewed team conference with pt and wife (via phone).  Both aware of targeted d/c date of 9/25 and supervision/ min assist goals.  Wife very concerned about need for pt to reach supervision level as "I really can't help him much but I can be here."  Pt much, much calmer and very pleasant and accepting of information.  Will continue to follow and monitor progress to anticipate if any barrier to d/c due to care needs.  Nora Sabey, LCSW

## 2017-01-03 NOTE — Progress Notes (Signed)
Speech Language Pathology Daily Session Note  Patient Details  Name: Perry Cervantes MRN: [id number] Date of Birth: 1951/03/30  Today's Date: 01/03/2017 SLP Individual Time: 66-1600 SLP Individual Time Calculation (min): 23 min (make up time for earlier missed minutes)  Short Term Goals: Week 1: SLP Short Term Goal 1 (Week 1): Pt will recall new, daily information with min assist verbal cues for use of external aids.   SLP Short Term Goal 2 (Week 1): Pt will return demonstration of at least 2 safety precautions wtih min assist verbal cues.   SLP Short Term Goal 3 (Week 1): Pt will recognize and correct errors in the moment during basic, functional tasks with min assist verbal cues.   SLP Short Term Goal 4 (Week 1): Pt will complete basic tasks with min assist verbal cues for functional problem solving.    Skilled Therapeutic Interventions:  Pt was seen for skilled ST session targeting cognitive goals.  Pt was very tangential when engaged in functional conversations with therapist and needed mod-max assist verbal cues for redirection.  Pt was able to identify at least 2 areas of deficits s/p CVA with max assist question cues.  Despite being able to verbalize intellectual awareness of his deficits he continues to have unrealistic expectations of going home with only his wife to provide assistance due to decreased anticipatory awareness of his limitations.  Pt initiated asking therapist what she felt he needed to work on and therapist discussed organizational/problem solving strategies to maximize safety through careful planning of tasks, thereby minimizing impulsivity.  Pt verbalized understanding but will need ongoing education.  Pt left in wheelchair with call bell within reach and quick release belt donned for safety.      Function:  Eating Eating                 Cognition Comprehension Comprehension assist level: Follows basic conversation/direction with extra time/assistive  device  Expression   Expression assist level: Expresses basic needs/ideas: With extra time/assistive device  Social Interaction Social Interaction assist level: Interacts appropriately 50 - 74% of the time - May be physically or verbally inappropriate.  Problem Solving Problem solving assist level: Solves basic 50 - 74% of the time/requires cueing 25 - 49% of the time  Memory Memory assist level: Recognizes or recalls 50 - 74% of the time/requires cueing 25 - 49% of the time    Pain Pain Assessment Pain Assessment: No/denies pain  Therapy/Group: Individual Therapy  Micai Apolinar, Selinda Orion 01/03/2017, 4:28 PM

## 2017-01-03 NOTE — Plan of Care (Signed)
Problem: RH Balance Goal: LTG Patient will maintain dynamic standing balance (PT) LTG:  Patient will maintain dynamic standing balance with assistance during mobility activities (PT)  Downgrade 2/2 slow progress, decreased awareness  Problem: RH Ambulation Goal: LTG Patient will ambulate in controlled environment (PT) LTG: Patient will ambulate in a controlled environment, # of feet with assistance (PT).  25 ft with LRAD; downgrade 2/2 slow progress, impaired awareness Goal: LTG Patient will ambulate in home environment (PT) LTG: Patient will ambulate in home environment, # of feet with assistance (PT).  Outcome: Not Applicable Date Met: 57/50/51 D/c goal - not appropriate at this time  Problem: RH Stairs Goal: LTG Patient will ambulate up and down stairs w/assist (PT) LTG: Patient will ambulate up and down # of stairs with assistance (PT)  4 steps with 1 rail; downgrade 2/2 slow progress, impaired awareness

## 2017-01-04 ENCOUNTER — Inpatient Hospital Stay (HOSPITAL_COMMUNITY): Payer: Medicare Other | Admitting: Speech Pathology

## 2017-01-04 ENCOUNTER — Inpatient Hospital Stay (HOSPITAL_COMMUNITY): Payer: Self-pay | Admitting: Occupational Therapy

## 2017-01-04 ENCOUNTER — Inpatient Hospital Stay (HOSPITAL_COMMUNITY): Payer: Medicare Other | Admitting: Physical Therapy

## 2017-01-04 NOTE — Progress Notes (Signed)
PT called RN to the room to check patient due to increased drowsiness and and slurred speech. Pt's vitals are stable at rest with B/P of 110/73. Pt complaining of feeling tired but no complaints of dizziness or any other physical symptoms. RN notified PA of these symptoms. Pt agreed to continue on with his therapy session. Will continue to monitor.

## 2017-01-04 NOTE — Progress Notes (Signed)
Speech Language Pathology Weekly Progress and Session Note  Patient Details  Name: Perry Cervantes MRN: [id number] Date of Birth: 1950/09/13  Beginning of progress report period: December 28, 2016 End of progress report period: January 04, 2017  Today's Date: 01/04/2017 SLP Individual Time: 0800-0830 Individual time Calculation: 30 minutes    Short Term Goals: Week 1: SLP Short Term Goal 1 (Week 1): Pt will recall new, daily information with min assist verbal cues for use of external aids.   SLP Short Term Goal 1 - Progress (Week 1): Met SLP Short Term Goal 2 (Week 1): Pt will return demonstration of at least 2 safety precautions wtih min assist verbal cues.   SLP Short Term Goal 2 - Progress (Week 1): Not met SLP Short Term Goal 3 (Week 1): Pt will recognize and correct errors in the moment during basic, functional tasks with min assist verbal cues.   SLP Short Term Goal 3 - Progress (Week 1): Met SLP Short Term Goal 4 (Week 1): Pt will complete basic tasks with min assist verbal cues for functional problem solving.   SLP Short Term Goal 4 - Progress (Week 1): Met    New Short Term Goals: Week 2: SLP Short Term Goal 1 (Week 2): Pt will recall new, daily information with supervision verbal cues for use of external aids.   SLP Short Term Goal 2 (Week 2): Pt will return demonstration of at least 2 safety precautions wtih min assist verbal cues.   SLP Short Term Goal 3 (Week 2): Pt will recognize and correct errors in the moment during basic, functional tasks with supervision verbal cues.   SLP Short Term Goal 4 (Week 2): Pt will complete basic tasks with supervision verbal cues for functional problem solving.    Weekly Progress Updates: Pt has made good progress this reporting period as evidenced by meeting 3 of 4 STGs. Pt with progress towards awareness, tasks completion and recall. Pt continues to require supervision for all cognitive tasks -especially with anticipatory awareness.  Skilled ST is required to advance pt's cognitive abilities and increase his functional independence. Anticipate that pt will require 24 hour supervision at discharge.     Intensity: Minumum of 1-2 x/day, 30 to 90 minutes Frequency: 3 to 5 out of 7 days Duration/Length of Stay: 21 days Treatment/Interventions: Cognitive remediation/compensation;Cueing hierarchy;Environmental controls;Internal/external aids;Patient/family education   Daily Session  Skilled Therapeutic Interventions: Skilled treatment session focused on cognition goals. SLP facilitated session by providing Min A faded to supervision for awareness and to explain discharge plan. Extensive time spent discussing need for supervision and then pt perseverative on discharge plan. Pt left upright in bed, bed alarm on and all needs within reach. Continue per current plan of care.      Function:     Cognition Comprehension Comprehension assist level: Follows basic conversation/direction with no assist  Expression   Expression assist level: Expresses basic needs/ideas: With no assist  Social Interaction Social Interaction assist level: Interacts appropriately 50 - 74% of the time - May be physically or verbally inappropriate.  Problem Solving Problem solving assist level: Solves basic 50 - 74% of the time/requires cueing 25 - 49% of the time  Memory Memory assist level: Recognizes or recalls 50 - 74% of the time/requires cueing 25 - 49% of the time   General    Pain    Therapy/Group: Individual Therapy  Perry Cervantes 01/04/2017, 4:04 PM

## 2017-01-04 NOTE — Progress Notes (Signed)
Occupational Therapy Session Note  Patient Details  Name: Perry Cervantes MRN: [id number] Date of Birth: 01-03-1951  Today's Date: 01/04/2017 OT Individual Time: 2458-0998 and 3382-5053 OT Individual Time Calculation (min): 56 min and 49 min   Skilled Therapeutic Interventions/Progress Updates:    Tx focus on Rt NMR and endurance during self care/leisure tasks.   Pt greeted in w/c, required auditory and tactile cues to open eyes. Pt reported feeling very lethargic (and appeared so), however agreeable to light tx. He completed oral care/grooming tasks w/c level at sink with [facility] assist for integrating R UE as gross stabilizer. Educated him on joint protection when managing R UE as he would pick up affected limb by digits. Afterwards he self propelled to dayroom. Engaged in meaningful keyboard playing activity for NMR, problem solving/memory, and endurance. Pt singing along while playing chords to the Beatles "Let it Be." Active assist for integrating R UE to press keys. Educated him on mental practice for NMR while Rt digits were placed over keyboard. AAROM in beat to [employer], pt visibly motivated, singing. Therapy ipad placed in pts room at end of tx for pain mgt. Ipad retrieved during lunch with pt reporting its helpfulness with pain/improving affect.   RN made aware of pts pain in back/Rt hip at time of departure.   2nd Session 1:1 tx (49 min) Tx focus on Rt NMR, endurance, and sitting balance during meaningful leisure engagement.   Pt greeted supine in bed with RN present. Still presenting with significant lethargy. Agreeable to tx in room. Max A for supine<sit for pt to play guitar. HOH for correct placement of R digits on strings. Pt engaging in guided mental practice of using Rt hand to play favorite Sadie Haber songs (also singing to music). Pt with active index finger movement during session! Per pt, the first he has seen this. Mod A for sitting balance without UE support. At end of tx  Max A for returning to supine. Provided him with yellow resistive sponge for continued mental rehearsal practice to promote functional neuroplastic changes. Pt receptive to mental practice education. At end of tx pt was repositioned for comfort and left with all needs within reach and bed alarm activated.   Therapy Documentation Precautions:  Precautions Precautions: Fall Precaution Comments: PTSD, verbose, angry at adult children who aren't helping his wife Restrictions Weight Bearing Restrictions: No General:   Vital Signs: Therapy Vitals Pulse Rate: 71 BP: 110/73 Patient Position (if appropriate): Sitting ADL:  :    See Function Navigator for Current Functional Status.   Therapy/Group: Individual Therapy  Nickalus Thornsberry A Lillard Bailon 01/04/2017, 12:42 PM

## 2017-01-04 NOTE — Progress Notes (Signed)
[place] PHYSICAL MEDICINE & REHABILITATION     PROGRESS NOTE    Subjective/Complaints: Up in bed. No new complaints. After he left room with therapy, they reported that he looked more lethargic.   ROS: pt denies nausea, vomiting, diarrhea, cough, shortness of breath or chest pain    Objective: Vital Signs: Blood pressure 110/73, pulse 71, temperature 98.1 F (36.7 C), temperature source Oral, resp. rate 17, height 5\' 9"  (1.753 m), weight 79.6 kg (175 lb 7.1 oz), SpO2 94 %. No results found. No results for input(s): WBC, HGB, HCT, PLT in the last 72 hours. No results for input(s): NA, K, CL, GLUCOSE, BUN, CREATININE, CALCIUM in the last 72 hours.  Invalid input(s): CO CBG (last 3)  No results for input(s): GLUCAP in the last 72 hours.  Wt Readings from Last 3 Encounters:  01/02/17 79.6 kg (175 lb 7.1 oz)  12/23/16 79.4 kg (175 lb)  12/22/16 79.4 kg (175 lb)    Physical Exam:  Constitutional: He is oriented to person, place, and time. He appears well-developed and well-nourished.  HENT:  Head: Normocephalic and atraumatic.  Eyes: EOM are normal. Right eye exhibits no discharge. Left eye exhibits no discharge.  Neck: supple.  Cardiovascular: RRR without murmur. No JVD       Respiratory: CTA Bilaterally without wheezes or rales. Normal effort   GI: Soft. Bowel sounds are normal. He exhibits no distension.  Musculoskeletal: He exhibits no edema or tenderness.  Neurological: He is fairly alert and oriented to person, place, and time.  Motor: RUE;  2-/5 proximal , 0/5 wrist and HI--no change RLE: HF, KE 3+/5, ADF/PF tr to 1/5  LUE/LLE: 5/5 proximal to distal DTRs 3+ RUE/RLE Skin: Skin is warm and dry.  Scattered abrasions present Psychiatric: calm and appropriate today. Did not appear overly sedated   Assessment/Plan: 1. 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 Physiatrist is providing close team supervision and 24 hour management of active medical problems listed below. Physiatrist and rehab team continue to assess barriers to discharge/monitor patient progress toward functional and medical goals.  Function:  Bathing Bathing position Bathing activity did not occur: Refused Position: Shower  Bathing parts Body parts bathed by patient: Right arm, Chest, Abdomen, Right upper leg, Left upper leg, Front perineal area Body parts bathed by helper: Right lower leg, Left lower leg, Buttocks, Left arm  Bathing assist Assist Level: Touching or steadying assistance(Pt > 75%)      Upper Body Dressing/Undressing Upper body dressing   What is the patient wearing?: Button up shirt     Pull over shirt/dress - Perfomed by patient: Thread/unthread left sleeve, Put head through opening Pull over shirt/dress - Perfomed by helper: Thread/unthread right sleeve, Thread/unthread left sleeve, Put head through opening, Pull shirt over trunk   Button up shirt - Perfomed by helper: Thread/unthread right sleeve, Thread/unthread left sleeve, Pull shirt around back, Button/unbutton shirt    Upper body assist Assist Level: Touching or steadying assistance(Pt > 75%)      Lower Body Dressing/Undressing Lower body dressing   What is the patient wearing?: Shoes, Pants Underwear - Performed by patient: Thread/unthread left underwear leg Underwear - Performed by helper: Thread/unthread right underwear leg, Pull underwear up/down Pants- Performed by patient: Thread/unthread left pants leg Pants- Performed by helper: Thread/unthread right pants leg, Thread/unthread left pants leg, Pull pants up/down, Fasten/unfasten pants  Non-skid slipper socks- Performed by helper: Don/doff left sock, Don/doff right sock     Shoes - Performed by patient: Don/doff left shoe Shoes - Performed by helper: Don/doff right shoe          Lower body  assist Assist for lower body dressing:  (max A)      Toileting Toileting Toileting activity did not occur: No continent bowel/bladder event Toileting steps completed by patient: Adjust clothing prior to toileting Toileting steps completed by helper: Adjust clothing prior to toileting, Performs perineal hygiene, Adjust clothing after toileting Toileting Assistive Devices: Other (comment) (stedy lift)  Toileting assist Assist level:  (total A)   Transfers Chair/bed transfer   Chair/bed transfer method: Stand pivot Chair/bed transfer assist level: Moderate assist (Pt 50 - 74%/lift or lower) Chair/bed transfer assistive device: Armrests     Locomotion Ambulation     Max distance: 25 Assist level: 2 helpers   Wheelchair   Type: Manual Max wheelchair distance: >300' Assist Level: Supervision or verbal cues  Cognition Comprehension Comprehension assist level: Follows basic conversation/direction with extra time/assistive device  Expression Expression assist level: Expresses basic needs/ideas: With extra time/assistive device  Social Interaction Social Interaction assist level: Interacts appropriately 50 - 74% of the time - May be physically or verbally inappropriate.  Problem Solving Problem solving assist level: Solves basic 50 - 74% of the time/requires cueing 25 - 49% of the time  Memory Memory assist level: Recognizes or recalls 50 - 74% of the time/requires cueing 25 - 49% of the time   Medical Problem List and Plan: 1.  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  DVT Prophylaxis/Anticoagulation: Subcutaneous heparin. Monitor platelet counts and any signs of bleeding 3. Pain Management/chronic back pain: Neurontin 300 mg 3 times a day, hydrocodone as needed 4. Mood/PTSD/paranoid personality disorder: Lexapro 20 mg daily, Seroquel 100 mg daily, Valium 10 mg TID  -home regimen is 10-10-20mg    -neuropsych input appreciated.   - seroquel  for paranoid, agitated behavior   -pt currently on 50-50-100mg  schedule currently which seems to have helped quite a bit. Will have to tolerate periodic fatigue.    5. Neuropsych: This patient is not fully capable of making decisions on his own behalf. 6. Skin/Wound Care: Routine skin checks 7. Fluids/Electrolytes/Nutrition: encourage PO 8. Hypertension. Minipress 5 mg daily at bedtime,Ziac 10-6..25 mg daily. 9. Hyperlipidemia. Lipitor 10. Rhabdomyolysis.   CK trending down 11. Left BBB with tachycardia. Echocardiogram reviewed per cardiology services troponin negative. No further plans for cardiac workup 12. Leukocytosis with possible pneumonia. Empiric Augmentin added 12/26/2016, completed    13. Tobacco abuse. Counseling as appropriate   LOS (Days) 9 A FACE TO FACE EVALUATION WAS PERFORMED  Alger Simons T, MD 01/04/2017 10:01 AM

## 2017-01-04 NOTE — Progress Notes (Signed)
Physical Therapy Session Note  Patient Details  Name: Perry Cervantes MRN: [id number] Date of Birth: 16-Feb-1951  Today's Date: 01/04/2017 PT Individual Time: 5009-3818 PT Individual Time Calculation (min): 68 min   Short Term Goals: Week 2:  PT Short Term Goal 1 (Week 2): Pt will transfer bed<>chair with min assist +1. PT Short Term Goal 2 (Week 2): Pt will complete bed mobility with supervision overall. PT Short Term Goal 3 (Week 2): Pt will ambulate 10 ft with LRAD & max assist +1.  Skilled Therapeutic Interventions/Progress Updates:  Pt received in bed & agreeable to tx. Pt noting pain in back & R hip and meds requested from RN. Pt transfers supine>sitting EOB with steady assist, HOB elevated, and bed rails with extra time. Pt requires assistance & cuing for donning shirt and pants sitting and standing EOB. Pt transfers bed>w/c via squat pivot with mod assist. Pt propels w/c throughout unit with L hemi technique and supervision. At rail in hallway pt performed standing and pre-gait stepping. Pt transfers sit>stand by pushing up on armrest with instructional cuing for technique and mod increasing to max assist. Utilized mirror for visual feedback for upright posture but pt requires max assist for anterior pelvic shift, upright torso, and approximation at R knee to prevent buckling. Pt only able to step with L foot 1-2 times before requiring seated rest breaks; activity focused on increased weight bearing through RLE and NMR. During task pt began to require increased assistance for transfers and standing balance with pt demonstrating more slurred speech. BP assessed (see below) & RN made aware - RN reports pt received medication this morning and this may be the cause and clears pt for continuation in therapy. Pt utilized dynavision from w/c level with task focusing on anterior weight shifting and increasing core strength. Attempted to have pt stand at high/low table to engage in UE task while  increasing weight bearing through RLE but pt unable to tolerate standing even with max assist 2/2 increasing lethargy. Pt assisted back to room & left sitting in w/c in room with QRB & R half lap tray donned & all needs within reach. PA Linna Hoff) made aware of pt's increasing lethargy during session.  Therapy Documentation Precautions:  Precautions Precautions: Fall Precaution Comments: PTSD, verbose, angry at adult children who aren't helping his wife Restrictions Weight Bearing Restrictions: No   See Function Navigator for Current Functional Status.   Therapy/Group: Individual Therapy  [name] 01/04/2017, 11:34 AM

## 2017-01-05 ENCOUNTER — Inpatient Hospital Stay (HOSPITAL_COMMUNITY): Payer: Self-pay | Admitting: Physical Therapy

## 2017-01-05 NOTE — Progress Notes (Signed)
Per[facility] Cerv[facility] PHYSICAL MEDICINE & REHABILITATION     PROGRESS NOTE    Subjective/Complaints: Up in bed. No new complaints. After he left room with therapy, they reported that he looked more lethargic.   ROS: pt denies nausea, vomiting, diarrhea, cough, shortness of breath or chest pain    Objective: Vital Signs: Blood pressure 118/72, pulse 71, temperature 98.2 F (36.8 C), temperature source Oral, resp. rate 18, height 5\' 9"  (1.753 m), weight 79.6 kg (175 lb 7.1 oz), SpO2 95 %. No results found. No results for input(s): WBC, HGB, HCT, PLT in the last 72 hours. No results for input(s): NA, K, CL, GLUCOSE, BUN, CREATININE, CALCIUM in the last 72 hours.  Invalid input(s): CO CBG (last 3)  No results for input(s): GLUCAP in the last 72 hours.  Wt Readings from Last 3 Encounters:  01/02/17 79.6 kg (175 lb 7.1 oz)  12/23/16 79.4 kg (175 lb)  12/22/16 79.4 kg (175 lb)    Physical Exam:  Constitutional: He is oriented to person, place, and time. He appears well-developed and well-nourished.  HENT:  Head: Normocephalic and atraumatic.  Eyes: EOM are normal. Right eye exhibits no discharge. Left eye exhibits no discharge.  Neck: supple.  Cardiovascular: RRR without murmur. No JVD       Respiratory: CTA Bilaterally without wheezes or rales. Normal effort   GI: Soft. Bowel sounds are normal. He exhibits no distension.  Musculoskeletal: He exhibits no edema or tenderness.  Neurological: He is fairly alert and oriented to person, place, and time.  Motor: RUE;  2-/5 proximal , 0/5 wrist and HI--no change RLE: HF, KE 3+/5, ADF/PF tr to 1/5  LUE/LLE: 5/5 proximal to distal DTRs 3+ RUE/RLE Skin: Skin is warm and dry.  Scattered abrasions present Psychiatric: calm and appropriate today. Did not appear overly sedated   Assessment/Plan: 1. 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 Physiatrist is providing close team supervision and 24 hour management of active medical problems listed below. Physiatrist and rehab team continue to assess barriers to discharge/monitor patient progress toward functional and medical goals.  Function:  Bathing Bathing position Bathing activity did not occur: Refused Position: Shower  Bathing parts Body parts bathed by patient: Right arm, Chest, Abdomen, Right upper leg, Left upper leg, Front perineal area Body parts bathed by helper: Right lower leg, Left lower leg, Buttocks, Left arm  Bathing assist Assist Level: Touching or steadying assistance(Pt > 75%)      Upper Body Dressing/Undressing Upper body dressing   What is the patient wearing?: Pull over shirt/dress     Pull over shirt/dress - Perfomed by patient: Thread/unthread left sleeve, Put head through opening Pull over shirt/dress - Perfomed by helper: Thread/unthread right sleeve, Pull shirt over trunk   Button up shirt - Perfomed by helper: Thread/unthread right sleeve, Thread/unthread left sleeve, Pull shirt around back, Button/unbutton shirt    Upper body assist Assist Level: Touching or steadying assistance(Pt > 75%)      Lower Body Dressing/Undressing Lower body dressing   What is the patient wearing?: Pants, Shoes Underwear - Performed by patient: Thread/unthread left underwear leg Underwear - Performed by helper: Thread/unthread right underwear leg, Pull underwear up/down Pants- Performed by patient: Thread/unthread left pants leg Pants- Performed by helper: Thread/unthread right pants leg, Thread/unthread left pants leg, Pull pants up/down, Fasten/unfasten pants   Non-skid slipper socks- Performed by helper: Don/doff  left sock, Don/doff right sock     Shoes - Performed by patient: Don/doff left shoe Shoes - Performed by helper: Don/doff right shoe, Don/doff left shoe          Lower body assist Assist for lower body  dressing:  (max A)      Toileting Toileting Toileting activity did not occur: No continent bowel/bladder event Toileting steps completed by patient: Adjust clothing prior to toileting Toileting steps completed by helper: Adjust clothing prior to toileting, Performs perineal hygiene, Adjust clothing after toileting Toileting Assistive Devices: Other (comment) (stedy lift)  Toileting assist Assist level:  (total A)   Transfers Chair/bed transfer   Chair/bed transfer method: Stand pivot Chair/bed transfer assist level: Moderate assist (Pt 50 - 74%/lift or lower) Chair/bed transfer assistive device: Armrests     Locomotion Ambulation     Max distance: 25 Assist level: 2 helpers   Wheelchair   Type: Manual Max wheelchair distance: 150 ft Assist Level: Supervision or verbal cues  Cognition Comprehension Comprehension assist level: Follows basic conversation/direction with no assist  Expression Expression assist level: Expresses basic needs/ideas: With no assist  Social Interaction Social Interaction assist level: Interacts appropriately 50 - 74% of the time - May be physically or verbally inappropriate.  Problem Solving Problem solving assist level: Solves basic 50 - 74% of the time/requires cueing 25 - 49% of the time  Memory Memory assist level: Recognizes or recalls 50 - 74% of the time/requires cueing 25 - 49% of the time   Medical Problem List and Plan: 1.  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  DVT Prophylaxis/Anticoagulation: Subcutaneous heparin. Monitor platelet counts and any signs of bleeding 3. Pain Management/chronic back pain: Neurontin 300 mg 3 times a day, hydrocodone as needed 4. Mood/PTSD/paranoid personality disorder: Lexapro 20 mg daily, Seroquel 100 mg daily, Valium 10 mg TID  -home regimen is 10-10-20mg    -neuropsych input appreciated.   - seroquel for paranoid, agitated behavior   -pt currently on 50-50-100mg   schedule currently which seems to have helped quite a bit. Will have to tolerate periodic fatigue.    5. Neuropsych: This patient is not fully capable of making decisions on his own behalf. 6. Skin/Wound Care: Routine skin checks 7. Fluids/Electrolytes/Nutrition: encourage PO 8. Hypertension. Minipress 5 mg daily at bedtime,Ziac 10-6..25 mg daily. 9. Hyperlipidemia. Lipitor 10. Rhabdomyolysis.   CK trending down 11. Left BBB with tachycardia. Echocardiogram reviewed per cardiology services troponin negative. No further plans for cardiac workup 12. Leukocytosis with possible pneumonia. Empiric Augmentin added 12/26/2016, completed    13. Tobacco abuse. Counseling as appropriate   LOS (Days) [place] EVALUATION WAS PERFORMED  Charlett Blake, MD 01/05/2017 2:30 PM

## 2017-01-05 NOTE — Progress Notes (Signed)
Patient has sealed pack of cigarettes in room. When questioned, raised voiced and expressed offense at the question. Refused teaching related to tobacco cessation. Stated he does not smoke while in the hospital. Will report to oncoming RN.

## 2017-01-06 ENCOUNTER — Inpatient Hospital Stay (HOSPITAL_COMMUNITY): Payer: Self-pay

## 2017-01-06 NOTE — Progress Notes (Signed)
Occupational Therapy Session Note  Patient Details  Name: Perry Cervantes MRN: [id number] Date of Birth: Oct 09, 1950  Today's Date: 01/06/2017 OT Individual Time: 5929-2446 OT Individual Time Calculation (min): 40 min    Short Term Goals: Week 1:  OT Short Term Goal 1 (Week 1): Pt will perform toilet transfer with mod A in order to decrease level of assist with functional transfer.  OT Short Term Goal 2 (Week 1): Pt will perform LB dressing with mod A.  OT Short Term Goal 3 (Week 1): Pt will maining dynamic standing balance at mod A for toileting task.  Skilled Therapeutic Interventions/Progress Updates:    Pt asleep in w/c upon arrival (QRB not fastened and half lap tray not in position). Pt required min multimodal cues to arouse.  Pt lethargic during session and speech slightly slurred.  Pt adamantly declined bathing/dressing tasks and required max verbal cues for follow one step commands.  Attempted to engage pt in dynamic sitting tasks and RUE NMR.  Pt unable to actively participate.  Pt required max A for repositioning in w/c as pt unable to scoot back into w/c.  Pt required max A for sit<>stand from w/c.  Pt remained in w/c with all needs within reach, QRB in place, and half lap tray positioned.    Therapy Documentation Precautions:  Precautions Precautions: Fall Precaution Comments: PTSD, verbose, angry at adult children who aren't helping his wife Restrictions Weight Bearing Restrictions: No Pain:  Pt c/o R hip pain, unrated; repositioned  See Function Navigator for Current Functional Status.   Therapy/Group: Individual Therapy  Leroy Libman 01/06/2017, 10:26 AM

## 2017-01-06 NOTE — Progress Notes (Signed)
[place] PHYSICAL MEDICINE & REHABILITATION     PROGRESS NOTE    Subjective/Complaints: Up in chair, practicing right hand grip strength  ROS: pt denies nausea, vomiting, diarrhea, cough, shortness of breath or chest pain    Objective: Vital Signs: Blood pressure 118/81, pulse 70, temperature 98.9 F (37.2 C), temperature source Oral, resp. rate 16, height 5\' 9"  (1.753 m), weight 79.6 kg (175 lb 7.1 oz), SpO2 99 %. No results found. No results for input(s): WBC, HGB, HCT, PLT in the last 72 hours. No results for input(s): NA, K, CL, GLUCOSE, BUN, CREATININE, CALCIUM in the last 72 hours.  Invalid input(s): CO CBG (last 3)  No results for input(s): GLUCAP in the last 72 hours.  Wt Readings from Last 3 Encounters:  01/02/17 79.6 kg (175 lb 7.1 oz)  12/23/16 79.4 kg (175 lb)  12/22/16 79.4 kg (175 lb)    Physical Exam:  Constitutional: He is oriented to person, place, and time. He appears well-developed and well-nourished.  HENT:  Head: Normocephalic and atraumatic.  Eyes: EOM are normal. Right eye exhibits no discharge. Left eye exhibits no discharge.  Neck: supple.  Cardiovascular: RRR without murmur. No JVD       Respiratory: CTA Bilaterally without wheezes or rales. Normal effort   GI: Soft. Bowel sounds are normal. He exhibits no distension.  Musculoskeletal: He exhibits no edema or tenderness.  Neurological: He is fairly alert and oriented to person, place, and time.  Motor: RUE;  2-/5 proximal , 0/5 wrist and HI--no change RLE: HF, KE 3+/5, ADF/PF tr to 1/5  LUE/LLE: 5/5 proximal to distal DTRs 3+ RUE/RLE Skin: Skin is warm and dry.  Scattered abrasions present Psychiatric: calm and appropriate today. Did not appear overly sedated   Assessment/Plan: 1. 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 Physiatrist is  providing close team supervision and 24 hour management of active medical problems listed below. Physiatrist and rehab team continue to assess barriers to discharge/monitor patient progress toward functional and medical goals.  Function:  Bathing Bathing position Bathing activity did not occur: Refused Position: Shower  Bathing parts Body parts bathed by patient: Right arm, Chest, Abdomen, Right upper leg, Left upper leg, Front perineal area Body parts bathed by helper: Right lower leg, Left lower leg, Buttocks, Left arm  Bathing assist Assist Level: Touching or steadying assistance(Pt > 75%)      Upper Body Dressing/Undressing Upper body dressing   What is the patient wearing?: Pull over shirt/dress     Pull over shirt/dress - Perfomed by patient: Thread/unthread left sleeve, Put head through opening Pull over shirt/dress - Perfomed by helper: Thread/unthread right sleeve, Pull shirt over trunk   Button up shirt - Perfomed by helper: Thread/unthread right sleeve, Thread/unthread left sleeve, Pull shirt around back, Button/unbutton shirt    Upper body assist Assist Level: Touching or steadying assistance(Pt > 75%)      Lower Body Dressing/Undressing Lower body dressing   What is the patient wearing?: Pants, Shoes Underwear - Performed by patient: Thread/unthread left underwear leg Underwear - Performed by helper: Thread/unthread right underwear leg, Pull underwear up/down Pants- Performed by patient: Thread/unthread left pants leg Pants- Performed by helper: Thread/unthread right pants leg, Thread/unthread left pants leg, Pull pants up/down, Fasten/unfasten pants   Non-skid slipper socks- Performed by helper: Don/doff left sock, Don/doff right sock     Shoes - Performed  by patient: Don/doff left shoe Shoes - Performed by helper: Don/doff right shoe, Don/doff left shoe          Lower body assist Assist for lower body dressing:  (max A)      Toileting Toileting Toileting  activity did not occur: No continent bowel/bladder event Toileting steps completed by patient: Adjust clothing prior to toileting Toileting steps completed by helper: Adjust clothing prior to toileting, Performs perineal hygiene, Adjust clothing after toileting Toileting Assistive Devices: Grab bar or rail  Toileting assist Assist level: Touching or steadying assistance (Pt.75%)   Transfers Chair/bed transfer   Chair/bed transfer method: Stand pivot Chair/bed transfer assist level: Moderate assist (Pt 50 - 74%/lift or lower) Chair/bed transfer assistive device: Armrests     Locomotion Ambulation     Max distance: 25 Assist level: 2 helpers   Wheelchair   Type: Manual Max wheelchair distance: 150 ft Assist Level: Supervision or verbal cues  Cognition Comprehension Comprehension assist level: Understands basic 75 - 89% of the time/ requires cueing 10 - 24% of the time  Expression Expression assist level: Expresses basic 90% of the time/requires cueing < 10% of the time.  Social Interaction Social Interaction assist level: Interacts appropriately 50 - 74% of the time - May be physically or verbally inappropriate.  Problem Solving Problem solving assist level: Solves basic 75 - 89% of the time/requires cueing 10 - 24% of the time  Memory Memory assist level: Recognizes or recalls 50 - 74% of the time/requires cueing 25 - 49% of the time   Medical Problem List and Plan: 1.  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  DVT Prophylaxis/Anticoagulation: Subcutaneous heparin. Monitor platelet counts and any signs of bleeding 3. Pain Management/chronic back pain: Neurontin 300 mg 3 times a day, hydrocodone as needed 4. Mood/PTSD/paranoid personality disorder: Lexapro 20 mg daily, Seroquel 100 mg daily, Valium 10 mg TID  -home regimen is 10-10-20mg    -neuropsych input appreciated.   - seroquel for paranoid, agitated behavior   -pt currently on  50-50-100mg  schedule currently which seems to have helped quite a bit. Will have to tolerate periodic fatigue.    5. Neuropsych: This patient is not fully capable of making decisions on his own behalf. 6. Skin/Wound Care: Routine skin checks 7. Fluids/Electrolytes/Nutrition: encourage PO 8. Hypertension. Minipress 5 mg daily at bedtime,Ziac 10-6..25 mg daily. Vitals:   01/05/17 2121 01/06/17 0635  BP: 117/82 118/81  Pulse:  70  Resp:  16  Temp:  98.9 F (37.2 C)  SpO2:  99%   9. Hyperlipidemia. Lipitor 10. Rhabdomyolysis.   CK trending down 11. Left BBB with tachycardia. Echocardiogram reviewed per cardiology services troponin negative. No further plans for cardiac workup 12. Leukocytosis with possible pneumonia. Empiric Augmentin added 12/26/2016, completed    13. Tobacco abuse. Counseling as appropriate   LOS (Days) 11 A FACE TO FACE EVALUATION WAS PERFORMED  Charlett Blake, MD 01/06/2017 1:02 PM

## 2017-01-06 NOTE — Progress Notes (Signed)
Physical Therapy Session Note  Patient Details  Name: Perry Cervantes MRN: [id number] Date of Birth: [birth year]  Today's Date: 01/05/2017 PT Individual Time:1400-1430   30 min   Short Term Goals: Week 2:  PT Short Term Goal 1 (Week 2): Pt will transfer bed<>chair with min assist +1. PT Short Term Goal 2 (Week 2): Pt will complete bed mobility with supervision overall. PT Short Term Goal 3 (Week 2): Pt will ambulate 10 ft with LRAD & max assist +1.  Skilled Therapeutic Interventions/Progress Updates:   Pt received supine in bed and agreeable to PT. Supine>sit transfer with mod assist and min cues for safety.   Squat pivot transfer training to [facility] from bed and to and from nustep with min assist from PT. Moderate cues for safety and set up from PT as well as proper UE placement to improve safety of transfer.   Nustep reciprocal movement training 2x5 minutes with 1 rest break. Level 4>6, Pt required use of UE and LE support to improve control and use of R side. Moderate cues for symmetry of movement, proper speed, and posture.   Patient returned too room and left sitting in [facility] with call bell in reach and all needs met.         Therapy Documentation Precautions:  Precautions Precautions: Fall Precaution Comments: PTSD, verbose, angry at adult children who aren't helping his wife Restrictions Weight Bearing Restrictions: No Pain: 0/10   See Function Navigator for Current Functional Status.   Therapy/Group: Individual Therapy  Lorie Phenix 01/06/2017, 5:36 AM

## 2017-01-07 ENCOUNTER — Inpatient Hospital Stay (HOSPITAL_COMMUNITY): Payer: Self-pay | Admitting: Physical Therapy

## 2017-01-07 ENCOUNTER — Inpatient Hospital Stay (HOSPITAL_COMMUNITY): Payer: Self-pay | Admitting: Occupational Therapy

## 2017-01-07 ENCOUNTER — Inpatient Hospital Stay (HOSPITAL_COMMUNITY): Payer: Medicare Other | Admitting: Physical Therapy

## 2017-01-07 ENCOUNTER — Inpatient Hospital Stay (HOSPITAL_COMMUNITY): Payer: Medicare Other | Admitting: Speech Pathology

## 2017-01-07 NOTE — Progress Notes (Signed)
Physical Therapy Session Note  Patient Details  Name: Perry Cervantes MRN: [id number] Date of Birth: 1950-05-15  Today's Date: 01/07/2017 PT Individual Time: 0930-1030 PT Individual Time Calculation (min): 60 min  Short Term Goals: Week 2:  PT Short Term Goal 1 (Week 2): Pt will transfer bed<>chair with min assist +1. PT Short Term Goal 2 (Week 2): Pt will complete bed mobility with supervision overall. PT Short Term Goal 3 (Week 2): Pt will ambulate 10 ft with LRAD & max assist +1.  Skilled Therapeutic Interventions/Progress Updates:  Treatment 1: Pt received asleep in w/c but awakened & agreeable to tx. Pt reports pain in R hip and appears very lethargic but is premedicated per RN. Pt's wife calling on phone and pt required total assist for using telephone. Pt propels w/c room>gym>dayroom with L hemi technique and mod assist fade to supervision for negotiation of doorways and linear path. Pt transfers w/c<>mat table via squat pivot with mod assist with total assist for w/c set up and parts management. During transfer pt requires multimodal cuing for sequencing and safety awareness. Pt engaged in task requiring him to reach outside of BOS to obtain then match cards on velcro board with task focusing on sitting balance. Pt requires min assist for sitting balance and relies heavily on LUE to pull on mat table to return to midline. In dayroom pt utilized cybex kinetron in sitting with cuing for technique with task focusing on BLE strengthening & R NMR. Throughout session pt with tangential conversation requiring frequent cuing to attend to task. At end of session pt left sitting in w/c in room with QRB & half lap tray donned and all needs within reach.  Treatment 2: Pt received in room eating lunch with wife & requesting to finish. Therapist returned shortly and pt now off unit with wife. Pt missed 45 minutes of skilled PT treatment, will f/u per POC.  Therapy Documentation Precautions:   Precautions Precautions: Fall Precaution Comments: PTSD, verbose, angry at adult children who aren't helping his wife Restrictions Weight Bearing Restrictions: No   General: PT Amount of Missed Time (min): 45 Minutes PT Missed Treatment Reason:  (lunch, pt off unit with wife)   See Function Navigator for Current Functional Status.   Therapy/Group: Individual Therapy  [name] 01/07/2017, 1:27 PM

## 2017-01-07 NOTE — Progress Notes (Signed)
Speech Language Pathology Daily Session Note  Patient Details  Name: Perry Cervantes MRN: [id number] Date of Birth: 09-Jul-1950  Today's Date: 01/07/2017 SLP Individual Time: 1415-1500 SLP Individual Time Calculation (min): 45 min  Short Term Goals: Week 2: SLP Short Term Goal 1 (Week 2): Pt will recall new, daily information with supervision verbal cues for use of external aids.   SLP Short Term Goal 2 (Week 2): Pt will return demonstration of at least 2 safety precautions wtih min assist verbal cues.   SLP Short Term Goal 3 (Week 2): Pt will recognize and correct errors in the moment during basic, functional tasks with supervision verbal cues.   SLP Short Term Goal 4 (Week 2): Pt will complete basic tasks with supervision verbal cues for functional problem solving.    Skilled Therapeutic Interventions: Skilled treatment session focused on cognitive goals. SLP re-administered the MoCA in which he scored 19/30 points with a score of 26 of above considered normal. Patient's score has actually decreased since initial evaluation, suspect due to testing taking place in a moderately distracting environment. Patient left upright in wheelchair with all needs within reach.      Function:  Eating Eating   Modified Consistency Diet: No Eating Assist Level: Set up assist for   Eating Set Up Assist For: Opening containers       Cognition Comprehension Comprehension assist level: Understands basic 75 - 89% of the time/ requires cueing 10 - 24% of the time  Expression   Expression assist level: Expresses basic 75 - 89% of the time/requires cueing 10 - 24% of the time. Needs helper to occlude trach/needs to repeat words.  Social Interaction Social Interaction assist level: Interacts appropriately 75 - 89% of the time - Needs redirection for appropriate language or to initiate interaction.  Problem Solving Problem solving assist level: Solves basic 75 - 89% of the time/requires cueing 10 - 24% of  the time  Memory Memory assist level: Recognizes or recalls 75 - 89% of the time/requires cueing 10 - 24% of the time    Pain No/Denies Pain   Therapy/Group: Individual Therapy  Floyde Dingley, [place] 01/07/2017, 3:37 PM

## 2017-01-07 NOTE — Plan of Care (Signed)
Problem: RH KNOWLEDGE DEFICIT Goal: RH STG INCREASE KNOWLEDGE OF HYPERTENSION Patient will verbalize understanding of HTN including medications, diet, exercise, monitoring, etc. With mod assist.  Outcome: Progressing Pt continues to need reinforcement

## 2017-01-07 NOTE — Progress Notes (Signed)
Occupational Therapy Session Note  Patient Details  Name: Perry Cervantes MRN: [id number] Date of Birth: 1950-04-27  Today's Date: 01/07/2017 OT Individual Time: 2458-0998 OT Individual Time Calculation (min): 64 min   Short Term Goals: Week 1:  OT Short Term Goal 1 (Week 1): Pt will perform toilet transfer with mod A in order to decrease level of assist with functional transfer.  OT Short Term Goal 2 (Week 1): Pt will perform LB dressing with mod A.  OT Short Term Goal 3 (Week 1): Pt will maining dynamic standing balance at mod A for toileting task.     Skilled Therapeutic Interventions/Progress Updates:    Tx focus on Rt NMR, standing balance, endurance, and adaptive bathing/dressing skills.   Pt greeted in w/c with RN and NT present. Pt requesting shower. He completed stand pivot transfer to TTB with Mod A. Min A 70% of time for balance maintenance, improving with time, pt often pulling grab bar to recover from posterior LOB while on bench. HOH assist for using R UE to wash Lt arm. Able to achieve figure 4 with L LE to his wash foot. Sit<stand for pericare with Mod A, Rt LE manually positioned and knee blocked; R UE weightbearing facilitated on grab bar. OT assisted with hair washing. Afterwards he transferred back to w/c and dressed w/c level at sink. Required assist for Rt side during UB/LB dressing, provided instruction on hemi techniques. Also assist for clothing mgt when standing at sink (once again, Mod A with Rt LE positioned and knee blocked). Total A for footwear. OT assisted with brushing hair on Rt side of head and fixing ponytail. Pt left in w/c with lap tray, safety belt, call bell within reach, and breakfast setup.  Set up [place] on room computer for psychosocial wellbeing. Pt singing along to meaningful music at time of departure.   Therapy Documentation Precautions:  Precautions Precautions: Fall Precaution Comments: PTSD, verbose, angry at adult children  who aren't helping his wife Restrictions Weight Bearing Restrictions: No    Pain: No c/o pain during tx    ADL:      See Function Navigator for Current Functional Status.   Therapy/Group: Individual Therapy  Celso Granja A Elison Worrel 01/07/2017, 12:04 PM

## 2017-01-07 NOTE — Progress Notes (Signed)
[place] PHYSICAL MEDICINE & REHABILITATION     PROGRESS NOTE    Subjective/Complaints: No new issues. Up in bed eating breakfast.   ROS: pt denies nausea, vomiting, diarrhea, cough, shortness of breath or chest pain   Objective: Vital Signs: Blood pressure 126/68, pulse 80, temperature 98.9 F (37.2 C), temperature source Oral, resp. rate 16, height 5\' 9"  (1.753 m), weight 79.6 kg (175 lb 7.1 oz), SpO2 98 %. No results found. No results for input(s): WBC, HGB, HCT, PLT in the last 72 hours. No results for input(s): NA, K, CL, GLUCOSE, BUN, CREATININE, CALCIUM in the last 72 hours.  Invalid input(s): CO CBG (last 3)  No results for input(s): GLUCAP in the last 72 hours.  Wt Readings from Last 3 Encounters:  01/02/17 79.6 kg (175 lb 7.1 oz)  12/23/16 79.4 kg (175 lb)  12/22/16 79.4 kg (175 lb)    Physical Exam:  Constitutional: He is oriented to person, place, and time. He appears well-developed and well-nourished.  HENT:  Head: Normocephalic and atraumatic.  Eyes: EOM are normal. Right eye exhibits no discharge. Left eye exhibits no discharge.  Neck: supple.  Cardiovascular: RRR without murmur. No JVD        Respiratory: CTA Bilaterally without wheezes or rales. Normal effort    GI: Soft. Bowel sounds are normal. He exhibits no distension.  Musculoskeletal: He exhibits no edema or tenderness.  Neurological: He is fairly alert and oriented to person, place, and time.  Motor: RUE;  2-/5 proximal , 0 to ?tr/5 wrist and HI--no change RLE: HF, KE 3+/5, ADF/PF tr to 1/5  LUE/LLE: 5/5 proximal to distal DTRs 3+ RUE/RLE Skin: Skin is warm and dry.  Scattered abrasions present Psychiatric: a little anxious/impulsive.    Assessment/Plan: 1. 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 Physiatrist is providing close team supervision  and 24 hour management of active medical problems listed below. Physiatrist and rehab team continue to assess barriers to discharge/monitor patient progress toward functional and medical goals.  Function:  Bathing Bathing position Bathing activity did not occur: Refused Position: Shower  Bathing parts Body parts bathed by patient: Right arm, Chest, Abdomen, Right upper leg, Left upper leg, Front perineal area Body parts bathed by helper: Right lower leg, Left lower leg, Buttocks, Left arm  Bathing assist Assist Level: Touching or steadying assistance(Pt > 75%)      Upper Body Dressing/Undressing Upper body dressing   What is the patient wearing?: Pull over shirt/dress     Pull over shirt/dress - Perfomed by patient: Thread/unthread left sleeve, Put head through opening Pull over shirt/dress - Perfomed by helper: Thread/unthread right sleeve, Pull shirt over trunk   Button up shirt - Perfomed by helper: Thread/unthread right sleeve, Thread/unthread left sleeve, Pull shirt around back, Button/unbutton shirt    Upper body assist Assist Level: Touching or steadying assistance(Pt > 75%)      Lower Body Dressing/Undressing Lower body dressing   What is the patient wearing?: Pants, Shoes Underwear - Performed by patient: Thread/unthread left underwear leg Underwear - Performed by helper: Thread/unthread right underwear leg, Pull underwear up/down Pants- Performed by patient: Thread/unthread left pants leg Pants- Performed by helper: Thread/unthread right pants leg, Thread/unthread left pants leg, Pull pants up/down, Fasten/unfasten pants   Non-skid slipper socks- Performed by helper: Don/doff left sock, Don/doff right sock     Shoes - Performed by  patient: Don/doff left shoe Shoes - Performed by helper: Don/doff right shoe, Don/doff left shoe          Lower body assist Assist for lower body dressing:  (max A)      Toileting Toileting Toileting activity did not occur: No  continent bowel/bladder event Toileting steps completed by patient: Adjust clothing prior to toileting Toileting steps completed by helper: Adjust clothing prior to toileting, Performs perineal hygiene, Adjust clothing after toileting Toileting Assistive Devices: Grab bar or rail  Toileting assist Assist level: Touching or steadying assistance (Pt.75%)   Transfers Chair/bed transfer   Chair/bed transfer method: Stand pivot Chair/bed transfer assist level: Moderate assist (Pt 50 - 74%/lift or lower) Chair/bed transfer assistive device: Armrests     Locomotion Ambulation     Max distance: 25 Assist level: 2 helpers   Wheelchair   Type: Manual Max wheelchair distance: 150 ft Assist Level: Supervision or verbal cues  Cognition Comprehension Comprehension assist level: Understands basic 75 - 89% of the time/ requires cueing 10 - 24% of the time  Expression Expression assist level: Expresses basic 75 - 89% of the time/requires cueing 10 - 24% of the time. Needs helper to occlude trach/needs to repeat words.  Social Interaction Social Interaction assist level: Interacts appropriately 75 - 89% of the time - Needs redirection for appropriate language or to initiate interaction.  Problem Solving Problem solving assist level: Solves basic 75 - 89% of the time/requires cueing 10 - 24% of the time  Memory Memory assist level: Recognizes or recalls 75 - 89% of the time/requires cueing 10 - 24% of the time   Medical Problem List and Plan: 1.  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  DVT Prophylaxis/Anticoagulation: Subcutaneous heparin. Monitor platelet counts and any signs of bleeding 3. Pain Management/chronic back pain: Neurontin 300 mg 3 times a day, hydrocodone as needed 4. Mood/PTSD/paranoid personality disorder: Lexapro 20 mg daily, Seroquel 100 mg daily, Valium 10 mg TID---continue  -home regimen is 10-10-20mg    -neuropsych input appreciated.    - seroquel for paranoid, agitated behavior   -pt currently on 50-50-100mg  schedule currently which seems to have helped quite a bit. Will have to tolerate periodic fatigue.    5. Neuropsych: This patient is not fully capable of making decisions on his own behalf. 6. Skin/Wound Care: Routine skin checks 7. Fluids/Electrolytes/Nutrition: encourage PO 8. Hypertension. Minipress 5 mg daily at bedtime,Ziac 10-6..25 mg daily. Vitals:   01/06/17 2111 01/07/17 0605  BP: 111/67 126/68  Pulse:  80  Resp:  16  Temp:  98.9 F (37.2 C)  SpO2:  98%   -controlled at present 9. Hyperlipidemia. Lipitor 10. Rhabdomyolysis.   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 Echocardiogram reviewed per cardiology services troponin negative. No further plans for cardiac workup 12. Leukocytosis with possible pneumonia. Empiric Augmentin added 12/26/2016, completed    13. Tobacco abuse. Counseling as appropriate   LOS (Days) 12 A FACE TO FACE EVALUATION WAS PERFORMED  Meredith Staggers, MD 01/07/2017 9:44 AM

## 2017-01-08 ENCOUNTER — Inpatient Hospital Stay (HOSPITAL_COMMUNITY): Payer: Medicare Other | Admitting: Speech Pathology

## 2017-01-08 ENCOUNTER — Inpatient Hospital Stay (HOSPITAL_COMMUNITY): Payer: Self-pay | Admitting: Physical Therapy

## 2017-01-08 ENCOUNTER — Inpatient Hospital Stay (HOSPITAL_COMMUNITY): Payer: Medicare Other | Admitting: Physical Therapy

## 2017-01-08 ENCOUNTER — Inpatient Hospital Stay (HOSPITAL_COMMUNITY): Payer: Self-pay | Admitting: Speech Pathology

## 2017-01-08 ENCOUNTER — Inpatient Hospital Stay (HOSPITAL_COMMUNITY): Payer: Medicare Other | Admitting: Occupational Therapy

## 2017-01-08 NOTE — Progress Notes (Signed)
Occupational Therapy Weekly Progress Note  Patient Details  Name: Perry Cervantes MRN: [id number] Date of Birth: 11/07/1950  Beginning of progress report period: December 27, 2016 End of progress report period: January 08, 2017  Today's Date: 01/08/2017 OT Individual Time: 3299-2426 OT Individual Time Calculation (min): 73 min    Patient has met 0 of 3 short term goals.  However, pt is making progress towards goals. Pt currently required max A for stand pivot transfers. Pt has movement in shoulder, biceps,triceps, wrist flexion, and finger flexion of R hand . Pt continues to require max verbal cues for safety awareness of R UE . Pt with decreased awareness of deficits.   Patient continues to demonstrate the following deficits: muscle weakness, decreased cardiorespiratoy endurance, decreased coordination, decreased awareness, decreased problem solving and decreased safety awareness and decreased sitting balance, decreased standing balance, decreased postural control, hemiplegia and decreased balance strategies and therefore will continue to benefit from skilled OT intervention to enhance overall performance with BADL.  Patient progressing toward long term goals..  Continue plan of care.  OT Short Term Goals Week 2:  OT Short Term Goal 1 (Week 2): Pt will perofrm toilet transfer with mod A.  OT Short Term Goal 2 (Week 2): Pt will perform toileting with mod A for dynamic standing balance.  OT Short Term Goal 3 (Week 2): Pt will perform shower transfer with mod A.   Skilled Therapeutic Interventions/Progress Updates:    Upon entering the room, pt seated in wheelchair awaiting therapist. Pt upset secondary to staff member asking him not to smoke while on hospital property. OT provided education for concerns regarding smoking related to increased stroke risk. Pt verbalized understanding and calming down and redirected for therapeutic intervention. Pt declined bathing and dressing this  session. Pt seated in wheelchair at sink and shaving face with set up A to obtain materials and assistance to shave R side of face. Pt also brushing teeth with min verbal cues for modifications. Pt propelled wheelchair with hemiplegic technique to day room with supervision. Pt engaged in  R UE NMR and noted movement for biceps, triceps, wrist flexion, and digit flexion against gravity. Pt very excited!. Pt returning to room at end of session and remained in wheelchair with quick release belt donned and call bell within reach.   Therapy Documentation Precautions:  Precautions Precautions: Fall Precaution Comments: PTSD, verbose, angry at adult children who aren't helping his wife Restrictions Weight Bearing Restrictions: No General:   Vital Signs: Therapy Vitals Temp: 98.1 F (36.7 C) Temp Source: Oral Pulse Rate: 68 Resp: 20 BP: 115/65 Patient Position (if appropriate): Lying Oxygen Therapy SpO2: 94 % O2 Device: Not Delivered Pain: Pain Assessment Pain Assessment: No/denies pain ADL:   Vision   Perception    Praxis   Exercises:   Other Treatments:    See Function Navigator for Current Functional Status.   Therapy/Group: Individual Therapy  Gypsy Decant 01/08/2017, 5:11 PM

## 2017-01-08 NOTE — Progress Notes (Signed)
Patient found by ST , [name] at approximately 1650, laying across low bed. Patient required assistance and was lowered to the ground by NT due to patient unable to stand. Lezlie Lye, PA notified of assisted fall. See flow sheet. Was the fall witnessed: no  Patient condition before and after the fall: same   Patient's reaction to the fall: irritated with staff  Name of the doctor that was notified including date and time: P. Love,PA  Any interventions and vital signs: Neuro assessment performed, VSS, toilet patient every 2 hours.   No complaint of pain, skin intact. No suspected head injury.

## 2017-01-08 NOTE — Progress Notes (Signed)
Speech Language Pathology Daily Session Note  Patient Details  Name: Perry Cervantes MRN: [id number] Date of Birth: 12-Oct-1950  Today's Date: 01/08/2017 SLP Individual Time: 1105-1200 SLP Individual Time Calculation (min): 55 min  Short Term Goals: Week 2: SLP Short Term Goal 1 (Week 2): Pt will recall new, daily information with supervision verbal cues for use of external aids.   SLP Short Term Goal 2 (Week 2): Pt will return demonstration of at least 2 safety precautions wtih min assist verbal cues.   SLP Short Term Goal 3 (Week 2): Pt will recognize and correct errors in the moment during basic, functional tasks with supervision verbal cues.   SLP Short Term Goal 4 (Week 2): Pt will complete basic tasks with supervision verbal cues for functional problem solving.    Skilled Therapeutic Interventions:  Pt was seen for skilled ST targeting cognitive goals.  SLP facilitated the session with a deductive reasoning/scheduling puzzle to address problem solving.  Pt needed min-mod assist verbal cues for planning and organization to complete task for 100% accuracy.  Pt was slightly verbose during task and conversations with therapist; however, he was able to redirect himself to task with supervision verbal cues.  Pt's wife was present upon return to room and was briefly updated regarding current goals and progress in therapies.    Pt was left in wheelchair with wife at bedside.  Continue per current plan of care.    Function:  Eating Eating                 Cognition Comprehension Comprehension assist level: Follows basic conversation/direction with extra time/assistive device  Expression   Expression assist level: Expresses basic needs/ideas: With no assist  Social Interaction Social Interaction assist level: Interacts appropriately 90% of the time - Needs monitoring or encouragement for participation or interaction.  Problem Solving Problem solving assist level: Solves basic 75 - 89%  of the time/requires cueing 10 - 24% of the time  Memory Memory assist level: Recognizes or recalls 75 - 89% of the time/requires cueing 10 - 24% of the time    Pain Pain Assessment Pain Assessment: 0-10 Pain Score: 10-Worst pain ever Pain Type: Acute pain Pain Location: Hip Pain Orientation: Right Pain Descriptors / Indicators: Aching Pain Frequency: Intermittent Pain Onset: Gradual Patients Stated Pain Goal: 3 Pain Intervention(s): RN made aware;Repositioned  Therapy/Group: Individual Therapy  Jazlyne Gauger, Selinda Orion 01/08/2017, 12:43 PM

## 2017-01-08 NOTE — Plan of Care (Signed)
Problem: RH BOWEL ELIMINATION Goal: RH STG MANAGE BOWEL WITH ASSISTANCE STG Manage Bowel with min Assistance.  Outcome: Not Progressing Patient requires medication

## 2017-01-08 NOTE — Progress Notes (Signed)
   01/08/17 1650  What Happened  Was fall witnessed? No  Was patient injured? No  Patient found other (Comment) (laying across bed)  Found by Staff-comment  Stated prior activity to/from bed, chair, or stretcher  Follow Up  MD notified P. Love, PA  Time MD notified 17  Family notified No- patient refusal  Additional tests No  Adult Fall Risk Assessment  Risk Factor Category (scoring not indicated) High fall risk per protocol (document High fall risk)  Age 65  Fall History: Fall within 6 months prior to admission 5  Elimination; Bowel and/or Urine Incontinence 2  Elimination; Bowel and/or Urine Urgency/Frequency 0  Medications: includes PCA/Opiates, Anti-convulsants, Anti-hypertensives, Diuretics, Hypnotics, Laxatives, Sedatives, and Psychotropics 3  Patient Care Equipment 1  Mobility-Assistance 2  Mobility-Gait 2  Mobility-Sensory Deficit 0  Altered awareness of immediate physical environment 1  Impulsiveness 0  Lack of understanding of one's physical/cognitive limitations 4  Total Score 21  Patient's Fall Risk High Fall Risk (>13 points)  Adult Fall Risk Interventions  Required Bundle Interventions *See Row Information* High fall risk - low, moderate, and high requirements implemented  Additional Interventions Lap belt while in chair/wheelchair;Reorient/diversional activities with confused patients;Room near nurses station;Use of appropriate toileting equipment (bedpan, BSC, etc.)  Screening for Fall Injury Risk  Risk For Fall Injury- See Row Information  Bleeding Risk - anticoagulation (not prophylaxis)  Required Injury Bundle Interventions *See Row Information* Injury Bundle Implemented  Pain Assessment  Pain Assessment No/denies pain

## 2017-01-08 NOTE — Progress Notes (Signed)
Speech Language Pathology Daily Session Note  Patient Details  Name: Perry Cervantes MRN: [id number] Date of Birth: [birth year]  Today's Date: 01/08/2017 SLP Individual Time: 0830-0900 SLP Individual Time Calculation (min): 30 min  Short Term Goals: Week 2: SLP Short Term Goal 1 (Week 2): Pt will recall new, daily information with supervision verbal cues for use of external aids.   SLP Short Term Goal 2 (Week 2): Pt will return demonstration of at least 2 safety precautions wtih min assist verbal cues.   SLP Short Term Goal 3 (Week 2): Pt will recognize and correct errors in the moment during basic, functional tasks with supervision verbal cues.   SLP Short Term Goal 4 (Week 2): Pt will complete basic tasks with supervision verbal cues for functional problem solving.    Skilled Therapeutic Interventions: Skilled treatment session focused on cognitive goals. SLP facilitated session by providing supervision-Min A verbal cues for emergent awareness and Min-Mod A verbal cues for anticipatory awareness in regards to activities patient will able to participate in safely at discharge. Overall, patient was more calm and cooperative today with overall increased awareness. Patient left upright in wheelchair with all needs within reach. Continue with current plan of care.      Function:   Cognition Comprehension Comprehension assist level: Follows basic conversation/direction with extra time/assistive device  Expression   Expression assist level: Expresses basic needs/ideas: With no assist  Social Interaction Social Interaction assist level: Interacts appropriately 90% of the time - Needs monitoring or encouragement for participation or interaction.  Problem Solving Problem solving assist level: Solves basic 75 - 89% of the time/requires cueing 10 - 24% of the time  Memory Memory assist level: Recognizes or recalls 75 - 89% of the time/requires cueing 10 - 24% of the time    Pain No reports of  pain  Therapy/Group: Individual Therapy  Tyriana Helmkamp, [place] 01/08/2017, 2:33 PM

## 2017-01-08 NOTE — Progress Notes (Signed)
[facility] PHYSICAL MEDICINE & REHABILITATION     PROGRESS NOTE    Subjective/Complaints: Up in bed. Feeling fairly well. Denies pain. Happy that he's making some progress  ROS: pt denies nausea, vomiting, diarrhea, cough, shortness of breath or chest pain   Objective: Vital Signs: Blood pressure 120/70, pulse 83, temperature 98 F (36.7 C), temperature source Oral, resp. rate 18, height 5\' 9"  (1.753 m), weight 79.6 kg (175 lb 7.1 oz), SpO2 99 %. No results found. No results for input(s): WBC, HGB, HCT, PLT in the last 72 hours. No results for input(s): NA, K, CL, GLUCOSE, BUN, CREATININE, CALCIUM in the last 72 hours.  Invalid input(s): CO CBG (last 3)  No results for input(s): GLUCAP in the last 72 hours.  Wt Readings from Last 3 Encounters:  01/02/17 79.6 kg (175 lb 7.1 oz)  12/23/16 79.4 kg (175 lb)  12/22/16 79.4 kg (175 lb)    Physical Exam:  Constitutional: He is oriented to person, place, and time. He appears well-developed and well-nourished.  HENT:  Head: Normocephalic and atraumatic.  Eyes: EOM are normal. Right eye exhibits no discharge. Left eye exhibits no discharge.  Neck: supple.  Cardiovascular: RRR without murmur. No JVD         Respiratory: CTA Bilaterally without wheezes or rales. Normal effort    GI: Soft. Bowel sounds are normal. He exhibits no distension.  Musculoskeletal: He exhibits no edema or tenderness.  Neurological: He is fairly alert and oriented to person, place, and time.  Motor: RUE;  2-/5 proximal , 1/5 wrist/finger synergy movement RLE: HF, KE 3+/5, ADF/PF tr to 1/5  LUE/LLE: 5/5 proximal to distal DTRs 3+ RUE/RLE Skin: Skin is warm and dry.  Scattered abrasions present and healing Psychiatric: a little anxious/impulsive.    Assessment/Plan: 1. 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 Physiatrist is providing close team supervision and 24 hour management of active medical problems listed below. Physiatrist and rehab team continue to assess barriers to discharge/monitor patient progress toward functional and medical goals.  Function:  Bathing Bathing position Bathing activity did not occur: Refused Position: Shower  Bathing parts Body parts bathed by patient: Right arm, Chest, Abdomen, Right upper leg, Left upper leg, Front perineal area, Left lower leg Body parts bathed by helper: Left arm, Buttocks, Right lower leg, Back  Bathing assist Assist Level: Touching or steadying assistance(Pt > 75%)      Upper Body Dressing/Undressing Upper body dressing   What is the patient wearing?: Pull over shirt/dress     Pull over shirt/dress - Perfomed by patient: Thread/unthread left sleeve, Put head through opening Pull over shirt/dress - Perfomed by helper: Thread/unthread right sleeve, Pull shirt over trunk   Button up shirt - Perfomed by helper: Thread/unthread right sleeve, Thread/unthread left sleeve, Pull shirt around back, Button/unbutton shirt    Upper body assist Assist Level:  (Mod A)      Lower Body Dressing/Undressing Lower body dressing   What is the patient wearing?: Pants, Non-skid slipper socks, Shoes Underwear - Performed by patient: Thread/unthread left underwear leg Underwear - Performed by helper: Thread/unthread right underwear leg, Pull underwear up/down Pants- Performed by patient: Thread/unthread left pants leg Pants- Performed by helper: Thread/unthread right pants leg, Pull pants up/down   Non-skid slipper socks- Performed by helper: Don/doff right sock, Don/doff left sock     Shoes - Performed by  patient: Don/doff left shoe Shoes - Performed by helper: Don/doff right shoe, Don/doff left shoe          Lower body assist Assist for lower body dressing:  (max A)      Toileting Toileting Toileting activity did not occur: No  continent bowel/bladder event Toileting steps completed by patient: Adjust clothing prior to toileting Toileting steps completed by helper: Adjust clothing prior to toileting, Performs perineal hygiene, Adjust clothing after toileting Toileting Assistive Devices: Grab bar or rail  Toileting assist Assist level: Touching or steadying assistance (Pt.75%)   Transfers Chair/bed transfer   Chair/bed transfer method: Stand pivot Chair/bed transfer assist level: Moderate assist (Pt 50 - 74%/lift or lower) Chair/bed transfer assistive device: Armrests     Locomotion Ambulation     Max distance: 25 Assist level: 2 helpers   Wheelchair   Type: Manual Max wheelchair distance: 150 ft Assist Level: Touching or steadying assistance (Pt > 75%)  Cognition Comprehension Comprehension assist level: Understands basic 90% of the time/cues < 10% of the time  Expression Expression assist level: Expresses basic needs/ideas: With no assist  Social Interaction Social Interaction assist level: Interacts appropriately 90% of the time - Needs monitoring or encouragement for participation or interaction.  Problem Solving Problem solving assist level: Solves basic 90% of the time/requires cueing < 10% of the time  Memory Memory assist level: Recognizes or recalls 90% of the time/requires cueing < 10% of the time   Medical Problem List and Plan: 1.  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  DVT Prophylaxis/Anticoagulation: Subcutaneous heparin. Monitor platelet counts and any signs of bleeding 3. Pain Management/chronic back pain: Neurontin 300 mg 3 times a day, hydrocodone as needed 4. Mood/PTSD/paranoid personality disorder: Lexapro 20 mg daily, Seroquel 100 mg daily, Valium 10 mg TID---continue  -home regimen is 10-10-20mg    -neuropsych input appreciated.   - seroquel for paranoid, agitated behavior   -pt currently on 50-50-100mg  schedule 5. Neuropsych: This  patient is not fully capable of making decisions on his own behalf. 6. Skin/Wound Care: Routine skin checks 7. Fluids/Electrolytes/Nutrition: encourage PO 8. Hypertension. Minipress 5 mg daily at bedtime,Ziac 10-6..25 mg daily. Vitals:   01/07/17 2130 01/08/17 0447  BP: (!) 118/59 120/70  Pulse:  83  Resp:  18  Temp:  98 F (36.7 C)  SpO2:  99%   -controlled at present 9/18 9. Hyperlipidemia. Lipitor 10. Rhabdomyolysis.   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 Echocardiogram reviewed per cardiology services troponin negative. No further plans for cardiac workup 12. Leukocytosis with possible pneumonia. Empiric Augmentin added 12/26/2016, completed    13. Tobacco abuse. Counseling as appropriate   LOS (Days) 13 A FACE TO FACE EVALUATION WAS PERFORMED  Meredith Staggers, MD 01/08/2017 9:05 AM

## 2017-01-08 NOTE — Progress Notes (Signed)
Physical Therapy Session Note  Patient Details  Name: Perry Cervantes MRN: [id number] Date of Birth: Apr 16, 1951  Today's Date: 01/08/2017 PT Individual Time: 0805-0830 PT Individual Time Calculation (min): 25 min   Short Term Goals: Week 2:  PT Short Term Goal 1 (Week 2): Pt will transfer bed<>chair with min assist +1. PT Short Term Goal 2 (Week 2): Pt will complete bed mobility with supervision overall. PT Short Term Goal 3 (Week 2): Pt will ambulate 10 ft with LRAD & max assist +1.  Skilled Therapeutic Interventions/Progress Updates:  Pt received in bed & agreeable to tx. Pt denied c/o pain during session. Pt transfers supine>sitting EOB with bed rails, HOB elevated and min assist when transferring out of bed on L side. Pt able to use LLE to assist RLE to EOB. Pt completes bed>w/c and w/c<>car (small SUV simulated height) x 3 trials with mod assist via stand pivot. Pt requires education for sequencing & safety and overall technique. Pt is able to utilize UE to assist LE in/out of car but does require assistance for management of RUE. Pt with improved awareness today as he was able to talk through car transfer before attempting it. At end of session pt left sitting in w/c in room with all needs within reach & in handoff to SLP.  Therapy Documentation Precautions:  Precautions Precautions: Fall Precaution Comments: PTSD, verbose, angry at adult children who aren't helping his wife Restrictions Weight Bearing Restrictions: No   See Function Navigator for Current Functional Status.   Therapy/Group: Individual Therapy  [name] 01/08/2017, 9:12 AM

## 2017-01-08 NOTE — Progress Notes (Signed)
Physical Therapy Session Note  Patient Details  Name: Perry Cervantes MRN: [id number] Date of Birth: 10/28/1950  Today's Date: 01/08/2017 PT Individual Time: 1415-1455 PT Individual Time Calculation (min): 40 min   Short Term Goals: Week 2:  PT Short Term Goal 1 (Week 2): Pt will transfer bed<>chair with min assist +1. PT Short Term Goal 2 (Week 2): Pt will complete bed mobility with supervision overall. PT Short Term Goal 3 (Week 2): Pt will ambulate 10 ft with LRAD & max assist +1.  Skilled Therapeutic Interventions/Progress Updates:   Pt supine upon arrival and agreeable to therapy, no c/o pain. Worked on [employer] this session w/ emphasis on R quad activation and RLE weight-bearing. Pt transferred to EOB and to chair via squat pivot w/ Min A. Performed RLE standing NMR in parallel bars w/ Min-Mod A for multiple sit<>stand transfers, lateral weight shifting, partial knee bends, and L step forward and back. Multimodal cues for posture, attention to task, and R quad activation. Pt had spilt drink on pants prior to session and returned to room and performed multiple sit<>stands w/ bed rail support and Min A from therapist. Therapists assisted in changing clothes w/ Mod A overall for time management. Returned to EOB and supine w/ Min A. Call bell within reach and all needs met. Pt lighting personal cigarette lighter at end of session and told that lighters were not allowed in this hospital. Pt resistant to hand over to staff member, RN made aware.   Therapy Documentation Precautions:  Precautions Precautions: Fall Precaution Comments: PTSD, verbose, angry at adult children who aren't helping his wife Restrictions Weight Bearing Restrictions: No Vital Signs: Therapy Vitals Temp: 98.1 F (36.7 C) Temp Source: Oral Pulse Rate: 68 Resp: 20 BP: 115/65 Patient Position (if appropriate): Lying Oxygen Therapy SpO2: 94 % O2 Device: Not Delivered Pain: Pain Assessment Pain Assessment:  0-10 Pain Score: 10-Worst pain ever Pain Type: Acute pain Pain Location: Hip Pain Orientation: Right Pain Descriptors / Indicators: Aching Pain Frequency: Intermittent Pain Onset: Gradual Patients Stated Pain Goal: 3 Pain Intervention(s): RN made aware;Repositioned  See Function Navigator for Current Functional Status.   Therapy/Group: Individual Therapy  Tiler Brandis K Arnette 01/08/2017, 3:22 PM

## 2017-01-09 ENCOUNTER — Inpatient Hospital Stay (HOSPITAL_COMMUNITY): Payer: Medicare Other | Admitting: Occupational Therapy

## 2017-01-09 ENCOUNTER — Inpatient Hospital Stay (HOSPITAL_COMMUNITY): Payer: Self-pay | Admitting: Speech Pathology

## 2017-01-09 ENCOUNTER — Inpatient Hospital Stay (HOSPITAL_COMMUNITY): Payer: Medicare Other | Admitting: Physical Therapy

## 2017-01-09 ENCOUNTER — Inpatient Hospital Stay (HOSPITAL_COMMUNITY): Payer: Self-pay | Admitting: Physical Therapy

## 2017-01-09 NOTE — Progress Notes (Signed)
Physical Therapy Session Note  Patient Details  Name: Perry Cervantes MRN: [id number] Date of Birth: Jun 04, 1950  Today's Date: 01/09/2017 PT Individual Time: 1004-1100 and 1335-1418 PT Individual Time Calculation (min): 56 min and 42 min   Short Term Goals: Week 2:  PT Short Term Goal 1 (Week 2): Pt will transfer bed<>chair with min assist +1. PT Short Term Goal 2 (Week 2): Pt will complete bed mobility with supervision overall. PT Short Term Goal 3 (Week 2): Pt will ambulate 10 ft with LRAD & max assist +1.  Skilled Therapeutic Interventions/Progress Updates:  Treatment 1: Pt received in w/c initially very irritated over family situation with therapist providing support and redirection. Pt reports pain in lower back but notes he is premedicated. Pt propelled w/c room<>gym with L hemi technique and supervision. Gait training in parallel bars (8 ft x 3 trials) with mod assist and therapist providing assistance for foot clearance and placement, blocking at R knee to prevent buckling and support to prevent hyperextension, cuing for gait pattern and upright posture. Pt is able to activate quads to advance RLE but is unable to clear foot 2/2 impaired dorsiflexion. Transitioned to gait training in hallway with L rail, mod assist, and mirror for visual feedback. Pt continues to require assistance for advancement and placement of RLE but minimal knee buckling noted but therapist has to facilitate positioning of RLE to prevent hyperextension. Pt ambulated 25 ft + 30 ft with rail and was able to achieve anterior pelvis and upright posture with facilitation and cuing. During session therapist provided pt with R elbow protector 2/2 poor awareness and pt already having allevyn patch on it. At end of session pt left sitting in w/c in room with QRB & half lap tray donned and all needs within reach.   Treatment 2: Pt received in w/c & agreeable to tx. Pt reporting pain in R hip & RN present & administering  medication. Pt irritated over ongoing family situation with therapist continuing to provide support and redirection. Session focused on w/c mobility for endurance training purposes and mobility in a community setting. Pt propelled w/c room>midwest unit, outside over uneven surface, and midwest unit>room with L hemi technique. Pt requires min assist to negotiate uneven terrain outside and supervision while inside over even surfaces; pt does require cuing to attend to R side and to avoid walls on R. Pt able to propel w/c ~200 ft at a time before requiring a rest break. At end of session pt left sitting in w/c with QRB & half lap tray donned and call bell within reach.   Therapy Documentation Precautions:  Precautions Precautions: Fall Precaution Comments: PTSD, verbose, angry at adult children who aren't helping his wife Restrictions Weight Bearing Restrictions: No   See Function Navigator for Current Functional Status.   Therapy/Group: Individual Therapy  [name] 01/09/2017, 2:30 PM

## 2017-01-09 NOTE — Patient Care Conference (Signed)
Inpatient RehabilitationTeam Conference and Plan of Care Update Date: 01/08/2017   Time: 2:35 PM    Patient Name: Perry Cervantes      Medical Record Number: [id number]  Date of Birth: 1950/10/28 Sex: Male         Room/Bed: 4W12C/4W12C-01 Payor Info: Payor: MEDICARE / Plan: MEDICARE PART A AND B / Product Type: *No Product type* /    Admitting Diagnosis: L CVA  Admit Date/Time:  12/26/2016  4:29 PM Admission Comments: No comment available   Primary Diagnosis:  Sequela of thrombotic cerebrovascular accident (CVA) Principal Problem: Sequela of thrombotic cerebrovascular accident (CVA)  Patient Active Problem List   Diagnosis Date Noted  . Paranoid behavior ([place]) 01/02/2017  . Sequela of thrombotic cerebrovascular accident (CVA)   . 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 Right hemiparesis ([place])   . 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 Neuropathic pain   . Adjustment disorder with mixed anxiety and depressed mood   . Benign essential HTN   . Hyperlipidemia   . Left bundle branch block   . Leukocytosis   . Tobacco abuse   . CVA (cerebral vascular accident) ([place]) 12/24/2016  . Chronic post-traumatic stress disorder (PTSD) 12/23/2016  . Depression 12/23/2016  . Chronic back pain 12/23/2016  . Right sided weakness 12/23/2016  . AKI (acute kidney injury) ([place]) 12/23/2016  . Rhabdomyolysis 12/23/2016  . Lumbar scoliosis 05/14/2013  . Heme positive stool 03/24/2012  . GERD (gastroesophageal reflux disease) 03/24/2012    Expected Discharge Date: Expected Discharge Date: 01/15/17 (will likely change plan to SNF)  Team Members Present: Physician leading conference: Dr. Alger Simons Social Worker Present: Lennart Pall, LCSW Nurse Present: Heather Roberts, RN PT Present: Lavone Nian, PT OT Present: Benay Pillow, OT SLP Present: Weston Anna, SLP PPS Coordinator present : Daiva Nakayama, RN, CRRN   Current Status/Progress Goal Weekly Team Focus  Medical   improved behavior, sometimes a little fatigued from medication  ongoing mood control while not oversedating  see prior   Bowel/Bladder   continent B/B with occasional incontinent episode at night. LBM  9/18  Continent of B/B  Manage bowel and bladder with min assist    Swallow/Nutrition/ Hydration             ADL's   Mod A bathing at shower level, Mod A UB dressing, Max A LB dressing, Mod A bathroom transfers   supervision - min A  NMR, balance, adaptive bathing/dressing skills, safety awareness, pt/family education    Mobility   mod assist squat pivot transfers, impaired NM control RUE/LE, min<>supervision w/c mobility  supervision for bed<>chair transfers, supervision w/c mobility, mod assist gait in controlled environment  R NMR, pt education, transfers, w/c mobility, safety awareness   Communication             Safety/Cognition/ Behavioral Observations  min-supervision   supervision   continue to address safety awareness and anticipatory awareness of his deficits    Pain             Skin   Scattered bruising and abrasions that are healing to upper and lower extremities. Foam to right elbow.   No new skin breakdown or tears while on rehab.  assess skin q shift and prn       *See Care Plan and progress notes for long and short-term goals.     Barriers to Discharge  Current Status/Progress Possible Resolutions Date Resolved  Physician    Decreased caregiver support;Behavior        ongoing NMR, orthotic utilization, family ed      Nursing                  PT  Decreased caregiver support;Home environment access/layout;Inaccessible home environment;Lack of/limited family support;Behavior  pt's wife unable to provide physical assist at d/c, pt with stairs to enter home, pt with impaired safety awareness              OT                  SLP                SW                Discharge Planning/Teaching Needs:  Pt  goal is to d/c home with wife who can only provide supervision.  Concern that he may need more assistance which could mean a change of plan to SNF.  To follow up further with wife      Team Discussion:  Better participation this week;  Showing some movement in UE now.  Min assist with stand-pivot transfers and min/ mod assist overall.  Goals set for min assist w/c level and concern that wife cannot provide this level of care. SW to follow up to determine if need to change d/c plan to SNF.  Revisions to Treatment Plan:  NA    Continued Need for Acute Rehabilitation Level of Care: The patient requires daily medical management by a physician with specialized training in physical medicine and rehabilitation for the following conditions: Daily direction of a multidisciplinary physical rehabilitation program to ensure safe treatment while eliciting the highest outcome that is of practical value to the patient.: Yes Daily medical management of patient stability for increased activity during participation in an intensive rehabilitation regime.: Yes Daily analysis of laboratory values and/or radiology reports with any subsequent need for medication adjustment of medical intervention for : Neurological problems;Mood/behavior problems  Corda Shutt 01/10/2017, 12:08 PM

## 2017-01-09 NOTE — Progress Notes (Signed)
[facility] PHYSICAL MEDICINE & REHABILITATION     PROGRESS NOTE    Subjective/Complaints: In bed resting. No new complaints. Had episode last evening where he was turned around in bed, no obvious fall.   ROS: pt denies nausea, vomiting, diarrhea, cough, shortness of breath or chest pain   Objective: Vital Signs: Blood pressure 129/76, pulse (!) 55, temperature 98.2 F (36.8 C), temperature source Oral, resp. rate 16, height 5\' 9"  (1.753 m), weight 79.6 kg (175 lb 7.1 oz), SpO2 98 %. No results found. No results for input(s): WBC, HGB, HCT, PLT in the last 72 hours. No results for input(s): NA, K, CL, GLUCOSE, BUN, CREATININE, CALCIUM in the last 72 hours.  Invalid input(s): CO CBG (last 3)  No results for input(s): GLUCAP in the last 72 hours.  Wt Readings from Last 3 Encounters:  01/02/17 79.6 kg (175 lb 7.1 oz)  12/23/16 79.4 kg (175 lb)  12/22/16 79.4 kg (175 lb)    Physical Exam:  Constitutional: He is oriented to person, place, and time. He appears well-developed and well-nourished.  HENT:  Head: Normocephalic and atraumatic.  Eyes: EOM are normal. Right eye exhibits no discharge. Left eye exhibits no discharge.  Neck: supple.  Cardiovascular: RRR without murmur. No JVD          Respiratory: CTA Bilaterally without wheezes or rales. Normal effort   GI: Soft. Bowel sounds are normal. He exhibits no distension.  Musculoskeletal: He exhibits no edema or tenderness.  Neurological: He is fairly alert and oriented to person, place, and time.  Motor: RUE;  2-/5 proximal , 1+/5 wrist/finger synergy movement--stable RLE: HF, KE 3+/5, ADF/PF tr to 1/5  LUE/LLE: 5/5 proximal to distal DTRs 3+ RUE/RLE Skin: Skin is warm and dry.  Scattered abrasions present and healing Psychiatric: a little anxious/impulsive.    Assessment/Plan: 1. Right hemiparesis and functional deficits secondary to left corona radiata infarct which require 3+ hours per day of interdisciplinary therapy  in a comprehensive inpatient rehab setting. Physiatrist is providing close team supervision and 24 hour management of active medical problems listed below. Physiatrist and rehab team continue to assess barriers to discharge/monitor patient progress toward functional and medical goals.  Function:  Bathing Bathing position Bathing activity did not occur: Refused Position: Shower  Bathing parts Body parts bathed by patient: Right arm, Chest, Abdomen, Right upper leg, Left upper leg, Front perineal area, Left lower leg Body parts bathed by helper: Left arm, Buttocks, Right lower leg, Back  Bathing assist Assist Level: Touching or steadying assistance(Pt > 75%)      Upper Body Dressing/Undressing Upper body dressing   What is the patient wearing?: Pull over shirt/dress     Pull over shirt/dress - Perfomed by patient: Thread/unthread left sleeve, Put head through opening Pull over shirt/dress - Perfomed by helper: Thread/unthread right sleeve, Pull shirt over trunk   Button up shirt - Perfomed by helper: Thread/unthread right sleeve, Thread/unthread left sleeve, Pull shirt around back, Button/unbutton shirt    Upper body assist Assist Level:  (Mod A)      Lower Body Dressing/Undressing Lower body dressing   What is the patient wearing?: Pants, Non-skid slipper socks, Shoes Underwear - Performed by patient: Thread/unthread left underwear leg Underwear - Performed by helper: Thread/unthread right underwear leg, Pull underwear up/down Pants- Performed by patient: Thread/unthread left pants leg Pants- Performed by helper: Thread/unthread right pants leg, Pull pants up/down   Non-skid slipper socks- Performed by helper: Don/doff right sock, Don/doff left sock  Shoes - Performed by patient: Don/doff left shoe Shoes - Performed by helper: Don/doff right shoe, Don/doff left shoe          Lower body assist Assist for lower body dressing:  (max A)      Toileting Toileting Toileting  activity did not occur: No continent bowel/bladder event Toileting steps completed by patient: Adjust clothing prior to toileting Toileting steps completed by helper: Adjust clothing prior to toileting, Performs perineal hygiene, Adjust clothing after toileting Toileting Assistive Devices: Grab bar or rail  Toileting assist Assist level: Touching or steadying assistance (Pt.75%)   Transfers Chair/bed transfer   Chair/bed transfer method: Squat pivot Chair/bed transfer assist level: Touching or steadying assistance (Pt > 75%) Chair/bed transfer assistive device: Armrests, Bedrails     Locomotion Ambulation     Max distance: 25 Assist level: 2 helpers   Wheelchair   Type: Manual Max wheelchair distance: 150 ft Assist Level: Touching or steadying assistance (Pt > 75%)  Cognition Comprehension Comprehension assist level: Follows basic conversation/direction with extra time/assistive device  Expression Expression assist level: Expresses basic needs/ideas: With no assist  Social Interaction Social Interaction assist level: Interacts appropriately 90% of the time - Needs monitoring or encouragement for participation or interaction.  Problem Solving Problem solving assist level: Solves basic 75 - 89% of the time/requires cueing 10 - 24% of the time  Memory Memory assist level: Recognizes or recalls 75 - 89% of the time/requires cueing 10 - 24% of the time   Medical Problem List and Plan: 1.  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  DVT Prophylaxis/Anticoagulation: Subcutaneous heparin. Monitor platelet counts and any signs of bleeding 3. Pain Management/chronic back pain: Neurontin 300 mg 3 times a day, hydrocodone as needed 4. Mood/PTSD/paranoid personality disorder: Lexapro 20 mg daily, Seroquel 100 mg daily, Valium 10 mg TID  -home valium regimen is 10-10-20mg    -neuropsych input appreciated--  - seroquel for paranoid, agitated  behavior   -pt currently on 50-50-100mg  schedule  -generally improved but still with occ mood swings 5. Neuropsych: This patient is not fully capable of making decisions on his own behalf. 6. Skin/Wound Care: Routine skin checks 7. Fluids/Electrolytes/Nutrition: encourage PO 8. Hypertension. Minipress 5 mg daily at bedtime,Ziac 10-6..25 mg daily. Vitals:   01/08/17 2300 01/09/17 0525  BP: 112/65 129/76  Pulse: 68 (!) 55  Resp: 16   Temp:  98.2 F (36.8 C)  SpO2: 100% 98%   -controlled at present 9/18 9. Hyperlipidemia. Lipitor 10. Rhabdomyolysis.   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 Echocardiogram reviewed per cardiology services troponin negative. No further plans for cardiac workup 12. Leukocytosis with possible pneumonia. Empiric Augmentin added 12/26/2016, completed    13. Tobacco abuse. Counseling as appropriate   LOS (Days) 14 A FACE TO FACE EVALUATION WAS PERFORMED  Alger Simons T, MD 01/09/2017 9:05 AM

## 2017-01-09 NOTE — Plan of Care (Signed)
Problem: RH Balance Goal: LTG Patient will maintain dynamic sitting balance (PT) LTG:  Patient will maintain dynamic sitting balance with assistance during mobility activities (PT)  Downgrade 2/2 impaired awareness & slow progress  Problem: RH Bed Mobility Goal: LTG Patient will perform bed mobility with assist (PT) LTG: Patient will perform bed mobility with assistance, with/without cues (PT).  Downgrade 2/2 slow progress  Problem: RH Bed to Chair Transfers Goal: LTG Patient will perform bed/chair transfers w/assist (PT) LTG: Patient will perform bed/chair transfers with assistance, with/without cues (PT).  Downgrade 2/2 impaired awareness  Problem: RH Car Transfers Goal: LTG Patient will perform car transfers with assist (PT) LTG: Patient will perform car transfers with assistance (PT).  Downgrade 2/2 impaired awareness  Problem: RH Furniture Transfers Goal: LTG Patient will perform furniture transfers w/assist (OT/PT LTG: Patient will perform furniture transfers  with assistance (OT/PT).  Downgrade 2/2 impaired awareness  Problem: RH Memory Goal: LTG Patient demonstrate ability for day to day recall (PT) LTG:  Patient will demonstrate ability for day to day recall/carryover during mobility activities with assist (PT)  Downgrade 2/2 impaired awareness

## 2017-01-09 NOTE — Progress Notes (Signed)
Speech Language Pathology Daily Session Note  Patient Details  Name: Perry Cervantes MRN: [id number] Date of Birth: [birth year]  Today's Date: 01/09/2017 SLP Individual Time: 1130-1200 SLP Individual Time Calculation (min): 30 min  Short Term Goals: Week 2: SLP Short Term Goal 1 (Week 2): Pt will recall new, daily information with supervision verbal cues for use of external aids.   SLP Short Term Goal 2 (Week 2): Pt will return demonstration of at least 2 safety precautions wtih min assist verbal cues.   SLP Short Term Goal 3 (Week 2): Pt will recognize and correct errors in the moment during basic, functional tasks with supervision verbal cues.   SLP Short Term Goal 4 (Week 2): Pt will complete basic tasks with supervision verbal cues for functional problem solving.    Skilled Therapeutic Interventions:  Pt was seen for skilled ST targeting cognitive goals.  Pt was sleeping soundly upon therapist's arrival and was confused upon awakening but cleared quickly.  Once fully awake, pt was able to recall at least 4 specific details from AM therapy sessions.  Pt became very upset and tangential about his fall yesterday.  He was adamant that he hadn't fallen but had purposely slid himself to the floor while waiting for nursing to come take him to the bathroom.  Pt needed max assist for support and redirection to prevent further escalation.  Pt was left in wheelchair resting calmly with call bell within reach and quick release belt donned.  Continue per current plan of care.    Function:  Eating Eating   Modified Consistency Diet: No Eating Assist Level: Set up assist for   Eating Set Up Assist For: Opening containers       Cognition Comprehension Comprehension assist level: Follows basic conversation/direction with extra time/assistive device  Expression   Expression assist level: Expresses basic needs/ideas: With no assist  Social Interaction Social Interaction assist level: Interacts  appropriately 75 - 89% of the time - Needs redirection for appropriate language or to initiate interaction.  Problem Solving Problem solving assist level: Solves basic 75 - 89% of the time/requires cueing 10 - 24% of the time  Memory Memory assist level: Recognizes or recalls 75 - 89% of the time/requires cueing 10 - 24% of the time    Pain Pain Assessment Pain Assessment: No/denies pain  Therapy/Group: Individual Therapy  Kamerin Axford, Selinda Orion 01/09/2017, 12:41 PM

## 2017-01-09 NOTE — Progress Notes (Addendum)
Physical Therapy Weekly Progress Note  Patient Details  Name: Perry Cervantes MRN: [id number] Date of Birth: [birth year]  Beginning of progress report period: January 03, 2017 End of progress report period: January 09, 2017  Today's Date: 01/09/2017  Patient has met 2 of 3 short term goals.  Pt is making fair progress towards LTGs. Pt continues to require max cuing for safety awareness and attention to RUE. On this date pt was able to ambulate 30 ft with rail in hallway and mod assist +1. Pt demonstrates improving neuromuscular control in RUE/RLE as he is able to weight bear through RLE and activate quads to advance RLE during gait. Pt would benefit from continued skilled PT to focus on pt/caregiver education, safety awareness, R NMR, balance, transfers, bed mobility, and gait.   Patient continues to demonstrate the following deficits muscle weakness, decreased cardiorespiratoy endurance, decreased coordination, decreased awareness, decreased problem solving, decreased safety awareness and decreased memory, and decreased sitting balance, decreased standing balance, decreased postural control, hemiplegia and decreased balance strategies and therefore will continue to benefit from skilled PT intervention to increase functional independence with mobility.  Patient's LTGs have been downgraded to min assist for transfers 2/2 impaired awareness.  PT Short Term Goals Week 2:  PT Short Term Goal 1 (Week 2): Pt will transfer bed<>chair with min assist +1. PT Short Term Goal 1 - Progress (Week 2): Met PT Short Term Goal 2 (Week 2): Pt will complete bed mobility with supervision overall. PT Short Term Goal 2 - Progress (Week 2): Not met PT Short Term Goal 3 (Week 2): Pt will ambulate 10 ft with LRAD & max assist +1. PT Short Term Goal 3 - Progress (Week 2): Met Week 3:  PT Short Term Goal 1 (Week 3): STG = LTG due to estimated d/c date.   Therapy Documentation Precautions:   Precautions Precautions: Fall Precaution Comments: PTSD, verbose, angry at adult children who aren't helping his wife Restrictions Weight Bearing Restrictions: No   See Function Navigator for Current Functional Status.   [name] 01/09/2017, 12:50 PM

## 2017-01-09 NOTE — Progress Notes (Signed)
Occupational Therapy Session Note  Patient Details  Name: DIYAN DAVE MRN: [id number] Date of Birth: 09-29-1950  Today's Date: 01/09/2017 OT Individual Time: 8101-7510 OT Individual Time Calculation (min): 72 min    Short Term Goals: Week 2:  OT Short Term Goal 1 (Week 2): Pt will perofrm toilet transfer with mod A.  OT Short Term Goal 2 (Week 2): Pt will perform toileting with mod A for dynamic standing balance.  OT Short Term Goal 3 (Week 2): Pt will perform shower transfer with mod A.    Skilled Therapeutic Interventions/Progress Updates:    Upon entering the room, pt supine in bed with no c/o pain. Wife calls multiple times this session on phone and pt engaging in heated/upset conversation. OT attempting to redirect pt and he eventually calmed down with time throughout session. Pt donning pants while supine with mod A to pull over B hips and onto R LE. Pt transferred into wheelchair with mod A stand pivot transfer. While seated in wheelchair at sink pt engaged in UB bathing at dressing with hand over hand assistance to utilize R UE. Pt performed grooming with set up A. Pt propelled wheelchair with hemiplegic technique and min A to make coffee with increased time and min cues for technique. Pt engaged in R UE NMR for exercise against gravity and in gravity eliminated positions for shoulder, elbow, wrist, and digits. Pt needing multiple rest breaks secondary to fatigue but making good progress. Pt returned to room at end of session with quick release belt donned and call bell within reach.   Therapy Documentation Precautions:  Precautions Precautions: Fall Precaution Comments: PTSD, verbose, angry at adult children who aren't helping his wife Restrictions Weight Bearing Restrictions: No General:   Vital Signs:  Pain: Pain Assessment Pain Assessment: 0-10 Pain Score: 9  Pain Type: Chronic pain Pain Location: Hip Pain Orientation: Right Pain Descriptors / Indicators:  Aching;Discomfort Pain Frequency: Constant Pain Onset: On-going Patients Stated Pain Goal: 3 Pain Intervention(s): Medication (See eMAR);Emotional support;Relaxation ADL:   Vision   Perception    Praxis   Exercises:   Other Treatments:    See Function Navigator for Current Functional Status.   Therapy/Group: Individual Therapy  Gypsy Decant 01/09/2017, 10:06 AM

## 2017-01-10 ENCOUNTER — Inpatient Hospital Stay (HOSPITAL_COMMUNITY): Payer: Medicare Other | Admitting: Physical Therapy

## 2017-01-10 ENCOUNTER — Inpatient Hospital Stay (HOSPITAL_COMMUNITY): Payer: Medicare Other | Admitting: Speech Pathology

## 2017-01-10 ENCOUNTER — Encounter (HOSPITAL_COMMUNITY): Payer: Self-pay

## 2017-01-10 ENCOUNTER — Inpatient Hospital Stay (HOSPITAL_COMMUNITY): Payer: Self-pay

## 2017-01-10 ENCOUNTER — Inpatient Hospital Stay (HOSPITAL_COMMUNITY): Payer: Medicare Other | Admitting: Occupational Therapy

## 2017-01-10 NOTE — Progress Notes (Signed)
Physical Therapy Session Note  Patient Details  Name: Perry Cervantes MRN: [id number] Date of Birth: 12/04/1950  Today's Date: 01/10/2017 PT Individual Time: 2122-4825 PT Individual Time Calculation (min): 40 min   Short Term Goals: Week 3:  PT Short Term Goal 1 (Week 3): STG = LTG due to estimated d/c date.  Skilled Therapeutic Interventions/Progress Updates:  Pt received in room with wife Horris Latino) present for family education. Horris Latino reports she plans for pt to d/c home with her; she also reports she will have someone assist them from 11am to the evening time and she is installing a ramp by Tuesday (d/c date).  Educated [place] on pt's need for 24 hr supervision/assist and RUE inattention. Pt propels w/c in unit with L hemi technique and supervision. Pt completes stand pivot w/c<>bed in rehab apartment with mod assist overall with facilitation for weight shifting, pivoting and cuing for technique and safety. Pt's wife provides significant assistance for sit<>supine as pt with difficulty transferring BLE onto bed then coming to supine 2/2 weak abdominal muscles; pt also requires significant assistance to achieve midline for sitting balance EOB. Therapist recommends that pt get hospital bed for increased ease and independence with bed mobility. In ortho gym pt's wife reports they have a tall jeep or an extremely tall truck to ride home in but are able to borrow a lower sedan for the time being, which is therapist's recommendation. Pt completes pivot w/c<>sedan simulated seat height with mod assist and assistance to transfer RLE in/out of car; pt & Horris Latino then return demonstrate transfer. Pt with poor safety awareness overall and poor understanding of technique despite cuing as pt will continue to lean back and pull on handle of simulator car and with poor ability to pivot buttocks. Educated [place]nie on w/c parts management with her return demonstrating. At end of session pt left sitting in w/c in room  with QRB donned & wife present to supervise.   Pt's wife voices comfort with education and hands on training on this date but plans to return next week for further practice.   Therapy Documentation Precautions:  Precautions Precautions: Fall Precaution Comments: PTSD, verbose, angry at adult children who aren't helping his wife Restrictions Weight Bearing Restrictions: No   See Function Navigator for Current Functional Status.   Therapy/Group: Individual Therapy  [name] 01/10/2017, 5:17 PM

## 2017-01-10 NOTE — Plan of Care (Signed)
Problem: RH Bathing Goal: LTG Patient will bathe with assist, cues/equipment (OT) LTG: Patient will bathe specified number of body parts with assist with/without cues using equipment (position)  (OT)  Downgraded secondary to safety  Problem: RH Dressing Goal: LTG Patient will perform upper body dressing (OT) LTG Patient will perform upper body dressing with assist, with/without cues (OT).  Downgraded secondary to decreased progress

## 2017-01-10 NOTE — Progress Notes (Signed)
Social Work Patient ID: [facility], male   DOB: [birth year], 66 y.o.   MRN: [id number]   Met with pt and wife today to review team conference.  Able to speak privately with wife prior to entering pt's room and expressed concern that his need for min assist/ physical assist at home is more than she can provide.  Again, offered option of SNF.  Wife declines SNF and feels "... If I tell him he's not going home I'm afraid he'll absolutely lose it."  She then reports that she has hired someone to come to their home daily from [place] (and this can be adjusted) to assist pt and she feels she can manage the other hours.   Upon entering the room, PT present, and I spoke about the arrangements with him.  He was agitated and appeared very angry with his wife.  He makes statements about his plans to regain his independence once home.  We spoke about need for family ed.  I am to follow up with wife tomorrow to see if she can ask the caregiver to come with her on Monday to complete education.  Pt is very reluctantly agreeable to recommended hospital bed for home.  Will plan toward targeted d/c date of 9/25.  Esma Kilts, LCSW

## 2017-01-10 NOTE — Progress Notes (Signed)
Occupational Therapy Session Note  Patient Details  Name: Perry Cervantes MRN: [id number] Date of Birth: Mar 19, 1951  Today's Date: 01/10/2017 OT Individual Time: 1445-1553 OT Individual Time Calculation (min): 68 min    Short Term Goals: Week 2:  OT Short Term Goal 1 (Week 2): Pt will perofrm toilet transfer with mod A.  OT Short Term Goal 2 (Week 2): Pt will perform toileting with mod A for dynamic standing balance.  OT Short Term Goal 3 (Week 2): Pt will perform shower transfer with mod A.   Skilled Therapeutic Interventions/Progress Updates:    Upon entering the room, pt seated in wheelchair awaiting therapist with wife, Horris Latino present in room for family education. Pt propelled self to tub room with hemiplegic technique and supervision. OT demonstrated simulated transfer from wheelchair <> TTB in tub shower with mod A. Caregiver return demonstration with proper body mechanics. OT recommended purchase of safety treads to increase safety and decrease fall risk with shower. OT demonstrated HEP for R UE NMR and safety concerns with skin integrity. OT also provided education and answering questions regarding HHOT recommendation at discharge. Pt propelled wheelchair 350'+ towards outside with hemiplegic technique at supervision level. OT observed pt to have cigarettes and provided education to pt and caregiver regarding smoke free campus, safety concerns, and increased stroke risk concerns. Pt verbalized understanding and therapist returning to unit with pt remaining outside with wife.   Therapy Documentation Precautions:  Precautions Precautions: Fall Precaution Comments: PTSD, verbose, angry at adult children who aren't helping his wife Restrictions Weight Bearing Restrictions: No General:   Vital Signs: Therapy Vitals Temp: 98.1 F (36.7 C) Temp Source: Oral Pulse Rate: 65 Resp: 18 BP: 120/71 Patient Position (if appropriate): Sitting Oxygen Therapy SpO2: 98 % O2 Device: Not  Delivered Pain: Pain Assessment Pain Assessment: 0-10 Pain Score: 2  Faces Pain Scale: Hurts a little bit Pain Type: Chronic pain Pain Location: Back Pain Orientation: Lower Pain Descriptors / Indicators: Aching;Discomfort Pain Frequency: Constant Pain Onset: On-going Patients Stated Pain Goal: 2 Pain Intervention(s): Medication (See eMAR);Emotional support;Relaxation PAINAD (Pain Assessment in Advanced Dementia) Breathing: normal Negative Vocalization: none Facial Expression: smiling or inexpressive Body Language: relaxed Consolability: no need to console PAINAD Score: 0  See Function Navigator for Current Functional Status.   Therapy/Group: Individual Therapy  Gypsy Decant 01/10/2017, 4:15 PM

## 2017-01-10 NOTE — Progress Notes (Signed)
Speech Language Pathology Daily Session Note  Patient Details  Name: Perry Cervantes MRN: [id number] Date of Birth: 11/02/1950  Today's Date: 01/10/2017 SLP Individual Time: 8003-4917 SLP Individual Time Calculation (min): 30 min  Short Term Goals: Week 2: SLP Short Term Goal 1 (Week 2): Pt will recall new, daily information with supervision verbal cues for use of external aids.   SLP Short Term Goal 2 (Week 2): Pt will return demonstration of at least 2 safety precautions wtih min assist verbal cues.   SLP Short Term Goal 3 (Week 2): Pt will recognize and correct errors in the moment during basic, functional tasks with supervision verbal cues.   SLP Short Term Goal 4 (Week 2): Pt will complete basic tasks with supervision verbal cues for functional problem solving.    Skilled Therapeutic Interventions: Skilled treatment session focused on cognitive goals. SLP facilitated session by providing extra time and supervision verbal cues for problem solving during a basic money management task. Patient demonstrated selective attention to task in a mildly distracting environment for ~15 minutes with Min A verbal cues for redirection. Patient left upright in wheelchair with all needs within reach. Continue with current plan of care.      Function:  Cognition Comprehension Comprehension assist level: Follows basic conversation/direction with extra time/assistive device  Expression   Expression assist level: Expresses basic needs/ideas: With no assist  Social Interaction Social Interaction assist level: Interacts appropriately 75 - 89% of the time - Needs redirection for appropriate language or to initiate interaction.  Problem Solving Problem solving assist level: Solves basic 75 - 89% of the time/requires cueing 10 - 24% of the time  Memory Memory assist level: Recognizes or recalls 75 - 89% of the time/requires cueing 10 - 24% of the time    Pain No/Denies Pain   Therapy/Group: Individual  Therapy  Omega Durante 01/10/2017, 3:26 PM

## 2017-01-10 NOTE — Plan of Care (Signed)
Problem: RH BLADDER ELIMINATION Goal: RH STG MANAGE BLADDER WITH ASSISTANCE STG Manage Bladder With min Assistance  Outcome: Progressing  01/10/17 1507  Bladder Management Goals  STG: Pt will manage bladder with assistance 1-Total assistance    Problem: RH SKIN INTEGRITY Goal: RH STG MAINTAIN SKIN INTEGRITY WITH ASSISTANCE STG Maintain Skin Integrity With min Assistance.  Outcome: Progressing  01/10/17 1507  Skin Integrity Goals  STG: Maintain skin integrity with assistance 1-Total assistance    Problem: RH SAFETY Goal: RH STG ADHERE TO SAFETY PRECAUTIONS W/ASSISTANCE/DEVICE STG Adhere to Safety Precautions With min Assistance/Device.  Outcome: Not Progressing  01/10/17 1507  Safety Goals  STG:Pt will adhere to safety precautions with assistance/device 1-Total assistance    Problem: RH COGNITION-NURSING Goal: RH STG USES MEMORY AIDS/STRATEGIES W/ASSIST TO PROBLEM SOLVE STG Uses Memory Aids/Strategies With min Assistance to Problem Solve.  Outcome: Progressing  01/10/17 1507  Memory Goals  STG: Uses memory aids/strategies with assistance 1-Total assistance

## 2017-01-10 NOTE — Progress Notes (Signed)
Physical Therapy Note  Patient Details  Name: Perry Cervantes MRN: [id number] Date of Birth: July 27, 1950 Today's Date: 01/10/2017  6073-7106, 60 min individual tx Pain: none per pt  Neuro re-ed via mulimodal cues for RLE clam shells 2 x 10 in L side lying; in supine-1 x 10 bil bridging, modified abdominal crunches, lower trunk rotation,  R shoulder abduction with assistance for elbow extension. Focus on slow, coordinated movements with R limbs. Bed mobility training for rolling to L with mod assist and L side lying to sitting with max assist in flat bed without rails. Squat pivot transfer to R with mod assist, focusing on head/hips relationship and R foot placement. Gait training at wall railing, mod assist for R stance stability and foot clearance during swing phase, mod VCs for upright trunk, forward gaze.  Pt tends to distract himself by greeting each person who passes him in the hall; decreases with reminders to focus on gait. Pt left resting in w/c with quick release belt applied , at nurse's station at his request.  See function navigator for current status.   Torry Istre 01/10/2017, 7:54 AM

## 2017-01-10 NOTE — Progress Notes (Signed)
[facility] PHYSICAL MEDICINE & REHABILITATION     PROGRESS NOTE    Subjective/Complaints: Eating breakfast with SLP. Has a little cough after eating. Asks if he's on track to leave non scheduled date  ROS: pt denies nausea, vomiting, diarrhea, cough, shortness of breath or chest pain   Objective: Vital Signs: Blood pressure 117/84, pulse 74, temperature 98.1 F (36.7 C), temperature source Oral, resp. rate 18, height 5\' 9"  (1.753 m), weight 79.6 kg (175 lb 7.1 oz), SpO2 96 %. No results found. No results for input(s): WBC, HGB, HCT, PLT in the last 72 hours. No results for input(s): NA, K, CL, GLUCOSE, BUN, CREATININE, CALCIUM in the last 72 hours.  Invalid input(s): CO CBG (last 3)  No results for input(s): GLUCAP in the last 72 hours.  Wt Readings from Last 3 Encounters:  01/02/17 79.6 kg (175 lb 7.1 oz)  12/23/16 79.4 kg (175 lb)  12/22/16 79.4 kg (175 lb)    Physical Exam:  Constitutional: He is oriented to person, place, and time. He appears well-developed and well-nourished.  HENT:  Head: Normocephalic and atraumatic.  Eyes: EOMI Neck: supple.  Cardiovascular: RRR without murmur. No JVD           Respiratory: CTA Bilaterally without wheezes or rales. Normal effort  GI: Soft. Bowel sounds are normal. He exhibits no distension.  Musculoskeletal: He exhibits no edema or tenderness.  Neurological: He is fairly alert and oriented to person, place, and time.  Motor: RUE;  2-/5 proximal , 1+/5 wrist/finger synergy movement--trace finger extension RLE: HF, KE 3+/5, ADF/PF tr to 1/5--stable  LUE/LLE: 5/5 proximal to distal DTRs 3+ RUE/RLE Skin: Skin is warm and dry.  Scattered abrasions present and healing Psychiatric: pleasant and cooperative.    Assessment/Plan: 1. 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 Physiatrist is  providing close team supervision and 24 hour management of active medical problems listed below. Physiatrist and rehab team continue to assess barriers to discharge/monitor patient progress toward functional and medical goals.  Function:  Bathing Bathing position Bathing activity did not occur: Refused Position: Shower  Bathing parts Body parts bathed by patient: Right arm, Chest, Abdomen, Right upper leg, Left upper leg, Front perineal area, Left lower leg Body parts bathed by helper: Left arm, Buttocks, Right lower leg, Back  Bathing assist Assist Level: Touching or steadying assistance(Pt > 75%)      Upper Body Dressing/Undressing Upper body dressing   What is the patient wearing?: Pull over shirt/dress     Pull over shirt/dress - Perfomed by patient: Thread/unthread left sleeve, Put head through opening, Pull shirt over trunk Pull over shirt/dress - Perfomed by helper: Thread/unthread right sleeve   Button up shirt - Perfomed by helper: Thread/unthread right sleeve, Thread/unthread left sleeve, Pull shirt around back, Button/unbutton shirt    Upper body assist Assist Level:  (min A)      Lower Body Dressing/Undressing Lower body dressing   What is the patient wearing?: Pants, Shoes Underwear - Performed by patient: Thread/unthread left underwear leg Underwear - Performed by helper: Thread/unthread right underwear leg, Pull underwear up/down Pants- Performed by patient: Thread/unthread left pants leg Pants- Performed by helper: Thread/unthread right pants leg, Pull pants up/down   Non-skid slipper socks- Performed by helper: Don/doff right sock, Don/doff left sock     Shoes - Performed by patient: Don/doff left shoe Shoes - Performed by  helper: Don/doff right shoe, Don/doff left shoe          Lower body assist Assist for lower body dressing:  (max A)      Toileting Toileting Toileting activity did not occur: No continent bowel/bladder event Toileting steps completed  by patient: Adjust clothing prior to toileting Toileting steps completed by helper: Adjust clothing prior to toileting, Performs perineal hygiene, Adjust clothing after toileting Toileting Assistive Devices: Grab bar or rail  Toileting assist Assist level: Touching or steadying assistance (Pt.75%)   Transfers Chair/bed transfer   Chair/bed transfer method: Squat pivot Chair/bed transfer assist level: Touching or steadying assistance (Pt > 75%) Chair/bed transfer assistive device: Armrests, Bedrails     Locomotion Ambulation     Max distance: 30 ft Assist level: Moderate assist (Pt 50 - 74%)   Wheelchair   Type: Manual Max wheelchair distance: 200 ft Assist Level: Supervision or verbal cues (supervision inside over even surfaces, min assist outside over uneven surface)  Cognition Comprehension Comprehension assist level: Follows basic conversation/direction with extra time/assistive device  Expression Expression assist level: Expresses basic needs/ideas: With no assist  Social Interaction Social Interaction assist level: Interacts appropriately 75 - 89% of the time - Needs redirection for appropriate language or to initiate interaction.  Problem Solving Problem solving assist level: Solves basic 75 - 89% of the time/requires cueing 10 - 24% of the time  Memory Memory assist level: Recognizes or recalls 75 - 89% of the time/requires cueing 10 - 24% of the time   Medical Problem List and Plan: 1.  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  DVT Prophylaxis/Anticoagulation: Subcutaneous heparin. Monitor platelet counts and any signs of bleeding 3. Pain Management/chronic back pain: Neurontin 300 mg 3 times a day, hydrocodone as needed 4. Mood/PTSD/paranoid personality disorder: Lexapro 20 mg daily, Seroquel 100 mg daily, Valium 10 mg TID  -home valium regimen is 10-10-20mg    -neuropsych input appreciated--  -  seroquel for paranoid, agitated behavior   -pt currently on 50-50-100mg  schedule  -generally improved but still with occ mood swings 5. Neuropsych: This patient is not fully capable of making decisions on his own behalf. 6. Skin/Wound Care: Routine skin checks 7. Fluids/Electrolytes/Nutrition: encourage PO 8. Hypertension. Minipress 5 mg daily at bedtime,Ziac 10-6..25 mg daily. Vitals:   01/09/17 2133 01/10/17 0526  BP: 126/73 117/84  Pulse:  74  Resp:  18  Temp:  98.1 F (36.7 C)  SpO2:  96%   -controlled at present 9/18 9. Hyperlipidemia. Lipitor 10. Rhabdomyolysis.   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 Echocardiogram reviewed per cardiology services troponin negative. No further plans for cardiac workup 12. Leukocytosis with possible pneumonia. Empiric Augmentin added 12/26/2016 which has completed    13. Tobacco abuse. Counseling as appropriate   LOS (Days) Crawford T, MD 01/10/2017 9:20 AM

## 2017-01-11 ENCOUNTER — Inpatient Hospital Stay (HOSPITAL_COMMUNITY): Payer: Medicare Other | Admitting: Occupational Therapy

## 2017-01-11 ENCOUNTER — Inpatient Hospital Stay (HOSPITAL_COMMUNITY): Payer: Medicare Other | Admitting: Physical Therapy

## 2017-01-11 ENCOUNTER — Inpatient Hospital Stay (HOSPITAL_COMMUNITY): Payer: Self-pay | Admitting: Occupational Therapy

## 2017-01-11 ENCOUNTER — Inpatient Hospital Stay (HOSPITAL_COMMUNITY): Payer: Medicare Other | Admitting: Speech Pathology

## 2017-01-11 NOTE — Progress Notes (Signed)
[facility] PHYSICAL MEDICINE & REHABILITATION     PROGRESS NOTE    Subjective/Complaints: Up eating breakfast at bedside. Overall doing ok. A little anxious this morning  ROS: pt denies nausea, vomiting, diarrhea, cough, shortness of breath or chest pain    Objective: Vital Signs: Blood pressure 109/64, pulse 64, temperature 98.1 F (36.7 C), temperature source Oral, resp. rate 19, height 5\' 9"  (1.753 m), weight 79.6 kg (175 lb 7.1 oz), SpO2 99 %. No results found. No results for input(s): WBC, HGB, HCT, PLT in the last 72 hours. No results for input(s): NA, K, CL, GLUCOSE, BUN, CREATININE, CALCIUM in the last 72 hours.  Invalid input(s): CO CBG (last 3)  No results for input(s): GLUCAP in the last 72 hours.  Wt Readings from Last 3 Encounters:  01/02/17 79.6 kg (175 lb 7.1 oz)  12/23/16 79.4 kg (175 lb)  12/22/16 79.4 kg (175 lb)    Physical Exam:  Constitutional: He is oriented to person, place, and time. He appears well-developed and well-nourished.  HENT:  Head: Normocephalic and atraumatic.  Eyes: EOMI Neck: supple.  Cardiovascular: RRR without murmur. No JVD           Respiratory: CTA Bilaterally without wheezes or rales. Normal effort   GI: Soft. Bowel sounds are normal. He exhibits no distension.  Musculoskeletal: He exhibits no edema or tenderness.  Neurological: He is fairly alert and oriented to person, place, and time.  Motor: RUE;  2-/5 proximal , 1+/5 wrist/finger synergy movement--trace finger extension RLE: HF, KE 3+/5, ADF/PF tr to 1/5--stable  LUE/LLE: 5/5 proximal to distal DTRs 3+ RUE/RLE Skin: Skin is warm and dry.  Scattered abrasions present and healing Psychiatric: pleasant and cooperative.    Assessment/Plan: 1. 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 Physiatrist is providing close team  supervision and 24 hour management of active medical problems listed below. Physiatrist and rehab team continue to assess barriers to discharge/monitor patient progress toward functional and medical goals.  Function:  Bathing Bathing position Bathing activity did not occur: Refused Position: Shower  Bathing parts Body parts bathed by patient: Right arm, Chest, Abdomen, Right upper leg, Left upper leg, Front perineal area, Left lower leg Body parts bathed by helper: Left arm, Buttocks, Right lower leg, Back  Bathing assist Assist Level: Touching or steadying assistance(Pt > 75%)      Upper Body Dressing/Undressing Upper body dressing   What is the patient wearing?: Pull over shirt/dress     Pull over shirt/dress - Perfomed by patient: Thread/unthread left sleeve, Put head through opening, Pull shirt over trunk Pull over shirt/dress - Perfomed by helper: Thread/unthread right sleeve   Button up shirt - Perfomed by helper: Thread/unthread right sleeve, Thread/unthread left sleeve, Pull shirt around back, Button/unbutton shirt    Upper body assist Assist Level:  (min A)      Lower Body Dressing/Undressing Lower body dressing   What is the patient wearing?: Pants, Shoes Underwear - Performed by patient: Thread/unthread left underwear leg Underwear - Performed by helper: Thread/unthread right underwear leg, Pull underwear up/down Pants- Performed by patient: Thread/unthread left pants leg Pants- Performed by helper: Thread/unthread right pants leg, Pull pants up/down   Non-skid slipper socks- Performed by helper: Don/doff right sock, Don/doff left sock     Shoes - Performed by patient: Don/doff left shoe Shoes - Performed by helper: Don/doff right shoe, Don/doff  left shoe          Lower body assist Assist for lower body dressing:  (max A)      Toileting Toileting Toileting activity did not occur: No continent bowel/bladder event Toileting steps completed by patient: Adjust  clothing prior to toileting Toileting steps completed by helper: Adjust clothing prior to toileting, Performs perineal hygiene, Adjust clothing after toileting Toileting Assistive Devices: Grab bar or rail  Toileting assist Assist level: Touching or steadying assistance (Pt.75%)   Transfers Chair/bed transfer   Chair/bed transfer method: Stand pivot Chair/bed transfer assist level: Moderate assist (Pt 50 - 74%/lift or lower) Chair/bed transfer assistive device: Armrests, Bedrails     Locomotion Ambulation     Max distance: 30 ft Assist level: Moderate assist (Pt 50 - 74%)   Wheelchair   Type: Manual Max wheelchair distance: 150 ft  Assist Level: Supervision or verbal cues  Cognition Comprehension Comprehension assist level: Follows basic conversation/direction with extra time/assistive device  Expression Expression assist level: Expresses basic needs/ideas: With no assist  Social Interaction Social Interaction assist level: Interacts appropriately 75 - 89% of the time - Needs redirection for appropriate language or to initiate interaction.  Problem Solving Problem solving assist level: Solves basic 75 - 89% of the time/requires cueing 10 - 24% of the time  Memory Memory assist level: Recognizes or recalls 75 - 89% of the time/requires cueing 10 - 24% of the time   Medical Problem List and Plan: 1.  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  DVT Prophylaxis/Anticoagulation: Subcutaneous heparin. Monitor platelet counts and any signs of bleeding 3. Pain Management/chronic back pain: Neurontin 300 mg 3 times a day, hydrocodone as needed 4. Mood/PTSD/paranoid personality disorder: Lexapro 20 mg daily, Seroquel 100 mg daily, Valium 10 mg TID  -home valium regimen is 10-10-20mg    -neuropsych input appreciated--  - seroquel for paranoid, agitated behavior   -pt currently on 50-50-100mg  schedule  -improved more  often than not.  5. Neuropsych: This patient is not fully capable of making decisions on his own behalf. 6. Skin/Wound Care: Routine skin checks 7. Fluids/Electrolytes/Nutrition: encourage PO 8. Hypertension. Minipress 5 mg daily at bedtime,Ziac 10-6..25 mg daily. Vitals:   01/10/17 2107 01/11/17 0436  BP: (!) 118/52 109/64  Pulse:  64  Resp:  19  Temp:  98.1 F (36.7 C)  SpO2:  99%   -controlled at present 9/18 9. Hyperlipidemia. Lipitor 10. Rhabdomyolysis.   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 Echocardiogram reviewed per cardiology services troponin negative. No further plans for cardiac workup 12. Leukocytosis with possible pneumonia. Empiric Augmentin added 12/26/2016 which has completed    13. Tobacco abuse. Counseling as appropriate   LOS (Days) 16 A FACE TO FACE EVALUATION WAS PERFORMED  Alger Simons T, MD 01/11/2017 10:14 AM

## 2017-01-11 NOTE — Progress Notes (Signed)
Physical Therapy Session Note  Patient Details  Name: Perry Cervantes MRN: [id number] Date of Birth: [birth year]  Today's Date: 01/11/2017 PT Individual Time: 3748-2707 PT Individual Time Calculation (min): 56 min   Short Term Goals: Week 3:  PT Short Term Goal 1 (Week 3): STG = LTG due to estimated d/c date.  Skilled Therapeutic Interventions/Progress Updates:  Pt received in w/c & agreeable to tx, reporting pain was "no more than usual". Pt propels w/c room<>gym with L hemi technique and supervision overall. Pt transfers onto mat into tall kneeling with max assist and max cuing for technique and sequencing. Pt able to maintain tall kneeling for ~1 minute + 30 seconds with max assist and multimodal cuing to facilitate upright posture. Pt with great difficulty extending hips, shifting pelvis anteriorly, and maintaining upright posture. While in tall kneeling pt engaged in peg board task with min cuing to assemble correct design. Pt then transferred w/c<>mat table; pt attempts to perform stand pivot with mod assist but requires cuing for squat pivot min assist.  Pt requires mod assist for sit<>supine as pt with weak abdominal muscles and inability to transfer LE onto mat table. While in supine pt performed BLE bridging then RLE single leg bridging with instructional and multimodal cuing for technique and assistance to maintain RLE position. Pt also requires cuing for proper breathing technique. At end of session pt left sitting in w/c in room with QRB & half lap tray donned and all needs within reach.  Therapy Documentation Precautions:  Precautions Precautions: Fall Precaution Comments: PTSD, verbose, angry at adult children who aren't helping his wife Restrictions Weight Bearing Restrictions: No   See Function Navigator for Current Functional Status.   Therapy/Group: Individual Therapy  [name] 01/11/2017, 12:04 PM

## 2017-01-11 NOTE — Progress Notes (Signed)
Speech Language Pathology Weekly Progress and Session Note  Patient Details  Name: OLEG OLESON MRN: [id number] Date of Birth: [birth year]  Beginning of progress report period: January 04, 2017 End of progress report period: January 11, 2017  Today's Date: 01/11/2017 SLP Individual Time: 1430-1500 SLP Individual Time Calculation (min): 30 min  Short Term Goals: Week 2: SLP Short Term Goal 1 (Week 2): Pt will recall new, daily information with supervision verbal cues for use of external aids.   SLP Short Term Goal 1 - Progress (Week 2): Met SLP Short Term Goal 2 (Week 2): Pt will return demonstration of at least 2 safety precautions wtih min assist verbal cues.   SLP Short Term Goal 2 - Progress (Week 2): Met SLP Short Term Goal 3 (Week 2): Pt will recognize and correct errors in the moment during basic, functional tasks with supervision verbal cues.   SLP Short Term Goal 3 - Progress (Week 2): Not met SLP Short Term Goal 4 (Week 2): Pt will complete basic tasks with supervision verbal cues for functional problem solving.   SLP Short Term Goal 4 - Progress (Week 2): Not met    New Short Term Goals: Week 3: SLP Short Term Goal 1 (Week 3): STG=LTGs  Weekly Progress Updates: Patient has made functional gains and has met 2 of 4 STGs this reporting period. Currently, patient requires overall Min-Mod A verbal cues for awareness, task completion and recall but continues to demonstrate decreased safety awareness, however, suspect some deficits are due to baseline behavioral issues. Patient and family education is ongoing. Patient would benefit from continued skilled SLP intervention to maximize his cognitive function and overall functional independence. Anticipate that patient will require 24 hour supervision at discharge.      Intensity: Minumum of 1-2 x/day, 30 to 90 minutes Frequency: 3 to 5 out of 7 days Duration/Length of Stay: 01/15/17 Treatment/Interventions: Cognitive  remediation/compensation;Cueing hierarchy;Environmental controls;Internal/external aids;Patient/family education   Daily Session  Skilled Therapeutic Interventions: Skilled treatment session focused on cognitive goals. SLP facilitated session by providing Min A verbal cues for patient to navigate and problem solve while following directions and setting up his new iphone.  Patient left upright in wheelchair with quick release belt in place and all needs within reach. Continue with current plan of care.        Function:    Cognition Comprehension Comprehension assist level: Follows basic conversation/direction with extra time/assistive device  Expression   Expression assist level: Expresses basic needs/ideas: With no assist  Social Interaction Social Interaction assist level: Interacts appropriately 75 - 89% of the time - Needs redirection for appropriate language or to initiate interaction.  Problem Solving Problem solving assist level: Solves basic 75 - 89% of the time/requires cueing 10 - 24% of the time  Memory Memory assist level: Recognizes or recalls 75 - 89% of the time/requires cueing 10 - 24% of the time   Pain No/Denies Pain   Therapy/Group: Individual Therapy  Shanyia Stines 01/11/2017, 3:25 PM

## 2017-01-11 NOTE — Progress Notes (Signed)
Occupational Therapy Session Note  Patient Details  Name: Perry Cervantes MRN: [id number] Date of Birth: 1951-03-20  Today's Date: 01/11/2017 OT Individual Time: 0086-7619 OT Individual Time Calculation (min): 67 min   Short Term Goals: Week 2:  OT Short Term Goal 1 (Week 2): Pt will perofrm toilet transfer with mod A.  OT Short Term Goal 2 (Week 2): Pt will perform toileting with mod A for dynamic standing balance.  OT Short Term Goal 3 (Week 2): Pt will perform shower transfer with mod A.   Skilled Therapeutic Interventions/Progress Updates:    Tx focus on Rt NMR, sitting balance, and adaptive dressing skills.   Pt greeted supine in bed, requesting to eat breakfast. Pt transitioned to EOB with Mod A for elevating trunk. With unilateral support on bed (Rt UE weightbearing), pt maintaining balance for approx 30 minutes with min guard. Bilateral UEs integrated for mgt of beverages. Near end of self feeding, pt losing balance posteriorly frequently due to fatigue/trunk weakness. Required Max A to correct. Max A stand pivot transfer with Rt knee blocked and R UE supported. He completed UB/LB dressing w/c level at sink. Pt still unable to achieve figure 4 or elevate Rt LE enough to thread limb through pants. Mod A for sit<stand with Rt knee blocked to lift pants over hips. Able to don Lt gripper sock today! Pt completed 2 grooming tasks w/c level at sink with facilitated R UE weightbearing on forearm, also used as gross stabilizer with HOH assist from OT as needed. At end of tx, OT fixed pts hair into ponytail. Safety belt and half lap tray applied. Pt left with spouse at time of departure (who had just arrived).   Therapeutic use of music utilized for increasing volition/psychosocial wellbeing.  Therapy Documentation Precautions:  Precautions Precautions: Fall Precaution Comments: PTSD, verbose, angry at adult children who aren't helping his wife Restrictions Weight Bearing Restrictions:  No    Pain: No c/o pain during tx    ADL:       See Function Navigator for Current Functional Status.   Therapy/Group: Individual Therapy  Kem Hensen A Markasia Carrol 01/11/2017, 12:12 PM

## 2017-01-11 NOTE — Progress Notes (Signed)
Occupational Therapy Session Note  Patient Details  Name: Perry Cervantes MRN: [id number] Date of Birth: 1950/12/01  Today's Date: 01/11/2017 OT Individual Time: 8032-1224 OT Individual Time Calculation (min): 43 min    Short Term Goals: Week 2:  OT Short Term Goal 1 (Week 2): Pt will perofrm toilet transfer with mod A.  OT Short Term Goal 2 (Week 2): Pt will perform toileting with mod A for dynamic standing balance.  OT Short Term Goal 3 (Week 2): Pt will perform shower transfer with mod A.   Skilled Therapeutic Interventions/Progress Updates:    Treatment session with focus on trunk control with transfers and RUE NMR.  Pt received upright in w/c reporting complaints regarding his lunch order, increasing agitation with attempts to correct or offer pt to make coffee.  Pt propelled w/c to therapy gym with hemi-technique with min cues to attend to LUE as pt bumping elbow into wall and doorframes.  Completed squat pivot transfers w/c <> therapy mat with mod assist with cues for trunk control and sequencing.  Engaged in [place] in unsupported sitting with focus on shoulder flexion/extension, horizontal abduction/adduction, and supination/pronation.  Completed with therapist providing facilitation to minimize compensatory strategies and overcompensation with trunk, progressing to use with UE Ranger to allow increased range of freedom.  Constant cues to decrease compensatory movements.  Returned to room as above and left with RN present to provide dressing changes.  Therapy Documentation Precautions:  Precautions Precautions: Fall Precaution Comments: PTSD, verbose, angry at adult children who aren't helping his wife Restrictions Weight Bearing Restrictions: No General:   Vital Signs: Therapy Vitals Temp: 98.5 F (36.9 C) Temp Source: Oral Pulse Rate: 87 Resp: 20 BP: 110/75 Patient Position (if appropriate): Sitting Oxygen Therapy SpO2: 98 % O2 Device: Not Delivered Pain:  Pt  with no c/o pain  See Function Navigator for Current Functional Status.   Therapy/Group: Individual Therapy  Simonne Come 01/11/2017, 2:40 PM

## 2017-01-11 NOTE — Plan of Care (Signed)
Problem: RH BLADDER ELIMINATION Goal: RH STG MANAGE BLADDER WITH ASSISTANCE STG Manage Bladder With min Assistance  Outcome: Not Progressing Requires condom cath at night for urgency incontinence

## 2017-01-12 ENCOUNTER — Encounter (HOSPITAL_COMMUNITY): Payer: Self-pay

## 2017-01-12 ENCOUNTER — Inpatient Hospital Stay (HOSPITAL_COMMUNITY): Payer: Self-pay | Admitting: Physical Therapy

## 2017-01-12 NOTE — Progress Notes (Signed)
[facility] PHYSICAL MEDICINE & REHABILITATION     PROGRESS NOTE    Subjective/Complaints: Uneventful night. Slept fairly well  ROS: pt denies nausea, vomiting, diarrhea, cough, shortness of breath or chest pain    Objective: Vital Signs: Blood pressure 100/73, pulse 80, temperature 98 F (36.7 C), temperature source Oral, resp. rate 18, height 5\' 9"  (1.753 m), weight 79.6 kg (175 lb 7.1 oz), SpO2 100 %. No results found. No results for input(s): WBC, HGB, HCT, PLT in the last 72 hours. No results for input(s): NA, K, CL, GLUCOSE, BUN, CREATININE, CALCIUM in the last 72 hours.  Invalid input(s): CO CBG (last 3)  No results for input(s): GLUCAP in the last 72 hours.  Wt Readings from Last 3 Encounters:  01/02/17 79.6 kg (175 lb 7.1 oz)  12/23/16 79.4 kg (175 lb)  12/22/16 79.4 kg (175 lb)    Physical Exam:  Constitutional: He is oriented to person, place, and time. He appears well-developed and well-nourished.  HENT:  Head: Normocephalic and atraumatic.  Eyes: EOMI Neck: supple.  Cardiovascular: RRR without murmur. No JVD           Respiratory: CTA Bilaterally without wheezes or rales. Normal effort  GI: Soft. Bowel sounds are normal. He exhibits no distension.  Musculoskeletal: He exhibits no edema or tenderness.  Neurological: He is fairly alert and oriented to person, place, and time.  Motor: RUE;  2-/5 proximal , 1+/5 wrist/finger synergy movement--trace finger extension RLE: HF, KE 3+/5, ADF/PF tr to 1/5--stable  LUE/LLE: 5/5 proximal to distal DTRs 3+ RUE/RLE Skin: Skin is warm and dry.  Scattered abrasions present and healing Psychiatric: pleasant and cooperative.    Assessment/Plan: 1. 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 Physiatrist is providing close team supervision and 24 hour management of active medical  problems listed below. Physiatrist and rehab team continue to assess barriers to discharge/monitor patient progress toward functional and medical goals.  Function:  Bathing Bathing position Bathing activity did not occur: Refused Position: Shower  Bathing parts Body parts bathed by patient: Right arm, Chest, Abdomen, Right upper leg, Left upper leg, Front perineal area, Left lower leg Body parts bathed by helper: Left arm, Buttocks, Right lower leg, Back  Bathing assist Assist Level: Touching or steadying assistance(Pt > 75%)      Upper Body Dressing/Undressing Upper body dressing   What is the patient wearing?: Pull over shirt/dress     Pull over shirt/dress - Perfomed by patient: Thread/unthread left sleeve, Put head through opening, Pull shirt over trunk Pull over shirt/dress - Perfomed by helper: Thread/unthread right sleeve   Button up shirt - Perfomed by helper: Thread/unthread right sleeve, Thread/unthread left sleeve, Pull shirt around back, Button/unbutton shirt    Upper body assist Assist Level:  (min A)      Lower Body Dressing/Undressing Lower body dressing   What is the patient wearing?: Pants, Shoes Underwear - Performed by patient: Thread/unthread left underwear leg Underwear - Performed by helper: Thread/unthread right underwear leg, Pull underwear up/down Pants- Performed by patient: Thread/unthread left pants leg Pants- Performed by helper: Thread/unthread right pants leg, Pull pants up/down Non-skid slipper socks- Performed by patient: Don/doff left sock Non-skid slipper socks- Performed by helper: Don/doff right sock     Shoes - Performed by patient: Don/doff left shoe Shoes - Performed by helper: Don/doff right shoe, Don/doff left shoe  Lower body assist Assist for lower body dressing:  (max A)      Toileting Toileting Toileting activity did not occur: No continent bowel/bladder event Toileting steps completed by patient: Adjust clothing  prior to toileting Toileting steps completed by helper: Adjust clothing prior to toileting, Performs perineal hygiene, Adjust clothing after toileting Toileting Assistive Devices: Grab bar or rail  Toileting assist Assist level: Touching or steadying assistance (Pt.75%)   Transfers Chair/bed transfer   Chair/bed transfer method: Squat pivot Chair/bed transfer assist level: Touching or steadying assistance (Pt > 75%) Chair/bed transfer assistive device: Armrests, Bedrails     Locomotion Ambulation     Max distance: 30 ft Assist level: Moderate assist (Pt 50 - 74%)   Wheelchair   Type: Manual Max wheelchair distance: 150 ft Assist Level: Supervision or verbal cues  Cognition Comprehension Comprehension assist level: Follows basic conversation/direction with extra time/assistive device  Expression Expression assist level: Expresses basic needs/ideas: With no assist  Social Interaction Social Interaction assist level: Interacts appropriately 75 - 89% of the time - Needs redirection for appropriate language or to initiate interaction.  Problem Solving Problem solving assist level: Solves basic 75 - 89% of the time/requires cueing 10 - 24% of the time  Memory Memory assist level: Recognizes or recalls 75 - 89% of the time/requires cueing 10 - 24% of the time   Medical Problem List and Plan: 1.  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  DVT Prophylaxis/Anticoagulation: Subcutaneous heparin. Monitor platelet counts and any signs of bleeding 3. Pain Management/chronic back pain: Neurontin 300 mg 3 times a day, hydrocodone as needed 4. Mood/PTSD/paranoid personality disorder: Lexapro 20 mg daily, Seroquel 100 mg daily, Valium 10 mg TID  -home valium regimen is 10-10-20mg    -neuropsych input appreciated--  - seroquel for paranoid, agitated behavior   -pt currently on 50-50-100mg  schedule  -behavior generally more even keeled 5. Neuropsych:  This patient is not fully capable of making decisions on his own behalf. 6. Skin/Wound Care: Routine skin checks 7. Fluids/Electrolytes/Nutrition: encourage PO 8. Hypertension. Minipress 5 mg daily at bedtime,Ziac 10-6..25 mg daily. Vitals:   01/11/17 1424 01/12/17 0258  BP: 110/75 100/73  Pulse: 87 80  Resp: 20 18  Temp: 98.5 F (36.9 C) 98 F (36.7 C)  SpO2: 98% 100%   -controlled at present 9/18 9. Hyperlipidemia. Lipitor 10. Rhabdomyolysis.   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 Echocardiogram reviewed per cardiology services troponin negative. No further plans for cardiac workup 12. Leukocytosis with possible pneumonia. Empiric Augmentin added 12/26/2016 which has completed    13. Tobacco abuse. Counseling as appropriate   LOS (Days) 17 A FACE TO FACE EVALUATION WAS PERFORMED  Meredith Staggers, MD 01/12/2017 9:51 AM

## 2017-01-12 NOTE — Plan of Care (Signed)
Problem: RH BLADDER ELIMINATION Goal: RH STG MANAGE BLADDER WITH ASSISTANCE STG Manage Bladder With min Assistance  Outcome: Not Progressing Incontinent overnight, requiring brief and bed change

## 2017-01-12 NOTE — Progress Notes (Signed)
Physical Therapy Session Note  Patient Details  Name: Perry Cervantes MRN: [id number] Date of Birth: 03-Dec-1950  Today's Date: 01/12/2017 PT Individual Time: 1527-1600 PT Individual Time Calculation (min): 33 min   Short Term Goals: Week 3:  PT Short Term Goal 1 (Week 3): STG = LTG due to estimated d/c date.  Skilled Therapeutic Interventions/Progress Updates:   Pt in w/c upon arrival and agreeable to therapy, c/o RUE discomfort but tolerable. NuStep 5 min @ L2 using bilateral LEs to warm up as pt reports increased fatigue. Practiced transfers going w/c to/from chair w/ Min guard to Mod A depending on fatigue level. Practiced going w/c to/from EOB w/ Min A in each direction x2. Verbal and visual cues for technique. Frequent redirecting due to distractions. Ended session in supine, call bell within reach and bed alarm on, all needs met.   Therapy Documentation Precautions:  Precautions Precautions: Fall Precaution Comments: PTSD, verbose, angry at adult children who aren't helping his wife Restrictions Weight Bearing Restrictions: No Vital Signs: Therapy Vitals Temp: 98.2 F (36.8 C) Temp Source: Oral Pulse Rate: 79 Resp: 18 BP: 125/62 Patient Position (if appropriate): Sitting Oxygen Therapy SpO2: 99 % O2 Device: Not Delivered  See Function Navigator for Current Functional Status.   Therapy/Group: Individual Therapy  Victorian Gunn K Arnette 01/12/2017, 4:29 PM

## 2017-01-13 ENCOUNTER — Inpatient Hospital Stay (HOSPITAL_COMMUNITY): Payer: Self-pay | Admitting: Occupational Therapy

## 2017-01-13 MED ORDER — QUETIAPINE FUMARATE 50 MG PO TABS
50.0000 mg | ORAL_TABLET | Freq: Two times a day (BID) | ORAL | Status: DC
  Administered 2017-01-13 – 2017-01-15 (×4): 50 mg via ORAL
  Filled 2017-01-13 (×4): qty 1

## 2017-01-13 NOTE — Plan of Care (Signed)
Problem: RH Dressing Goal: LTG Patient will perform lower body dressing w/assist (OT) LTG: Patient will perform lower body dressing with assist, with/without cues in positioning using equipment (OT)  Downgraded due to slow progress MH  Problem: RH Toileting Goal: LTG Patient will perform toileting w/assist, cues/equip (OT) LTG: Patient will perform toiletiing (clothes management/hygiene) with assist, with/without cues using equipment (OT)  Downgraded due to slow progress MH

## 2017-01-13 NOTE — Plan of Care (Signed)
Problem: RH Balance Goal: LTG: Patient will maintain dynamic sitting balance (OT) LTG:  Patient will maintain dynamic sitting balance with assistance during activities of daily living (OT)  Downgraded due to slow progress Goal: LTG Patient will maintain dynamic standing with ADLs (OT) LTG:  Patient will maintain dynamic standing balance with assist during activities of daily living (OT)   Downgraded due to slow progress MH

## 2017-01-13 NOTE — Progress Notes (Signed)
[facility] PHYSICAL MEDICINE & REHABILITATION     PROGRESS NOTE    Subjective/Complaints: Pt attempted to get up out of bed overnight and was found on floor. Was in bed upset that he couldn't get up and put jeans on this morning.   ROS: pt denies nausea, vomiting, diarrhea, cough, shortness of breath or chest pain   Objective: Vital Signs: Blood pressure 122/60, pulse 78, temperature 97.8 F (36.6 C), temperature source Oral, resp. rate 18, height 5\' 9"  (1.753 m), weight 79.6 kg (175 lb 7.1 oz), SpO2 100 %. No results found. No results for input(s): WBC, HGB, HCT, PLT in the last 72 hours. No results for input(s): NA, K, CL, GLUCOSE, BUN, CREATININE, CALCIUM in the last 72 hours.  Invalid input(s): CO CBG (last 3)  No results for input(s): GLUCAP in the last 72 hours.  Wt Readings from Last 3 Encounters:  01/02/17 79.6 kg (175 lb 7.1 oz)  12/23/16 79.4 kg (175 lb)  12/22/16 79.4 kg (175 lb)    Physical Exam:  Constitutional: He is oriented to person, place, and time. He appears well-developed and well-nourished.  HENT:  Head: Normocephalic and atraumatic.  Eyes: EOMI Neck: supple.  Cardiovascular: RRR without murmur. No JVD            Respiratory: CTA Bilaterally without wheezes or rales. Normal effort   GI: Soft. Bowel sounds are normal. He exhibits no distension.  Musculoskeletal: He exhibits no edema or tenderness.  Neurological: He is fairly alert and oriented to person, place, and time.  Motor: RUE;  2-/5 proximal , 1+/5 wrist/finger synergy movement--trace finger extension RLE: HF, KE 3+/5, ADF/PF tr to 1/5--stable  LUE/LLE: 5/5 proximal to distal DTRs 3+ RUE/RLE Skin: Skin is warm and dry.  Scattered abrasions present and healing Psychiatric: irritated and restless.    Assessment/Plan: 1. 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 Physiatrist is providing close team supervision and 24 hour management of active medical problems listed below. Physiatrist and rehab team continue to assess barriers to discharge/monitor patient progress toward functional and medical goals.  Function:  Bathing Bathing position Bathing activity did not occur: Refused Position: Shower  Bathing parts Body parts bathed by patient: Right arm, Chest, Abdomen, Right upper leg, Left upper leg, Front perineal area, Left lower leg Body parts bathed by helper: Left arm, Buttocks, Right lower leg, Back  Bathing assist Assist Level: Touching or steadying assistance(Pt > 75%)      Upper Body Dressing/Undressing Upper body dressing   What is the patient wearing?: Pull over shirt/dress     Pull over shirt/dress - Perfomed by patient: Thread/unthread left sleeve, Put head through opening, Pull shirt over trunk Pull over shirt/dress - Perfomed by helper: Thread/unthread right sleeve   Button up shirt - Perfomed by helper: Thread/unthread right sleeve, Thread/unthread left sleeve, Pull shirt around back, Button/unbutton shirt    Upper body assist Assist Level:  (min A)      Lower Body Dressing/Undressing Lower body dressing   What is the patient wearing?: Pants, Shoes Underwear - Performed by patient: Thread/unthread left underwear leg Underwear - Performed by helper: Thread/unthread right underwear leg, Pull underwear up/down Pants- Performed by patient: Thread/unthread left pants leg Pants- Performed by helper: Thread/unthread right pants leg, Pull pants up/down Non-skid slipper socks- Performed by patient: Don/doff left sock Non-skid slipper socks- Performed by helper: Don/doff right sock  Shoes - Performed by patient: Don/doff left shoe Shoes - Performed by helper: Don/doff right shoe, Don/doff left shoe          Lower body assist Assist for lower body dressing:  (max A)      Toileting Toileting Toileting activity  did not occur: No continent bowel/bladder event Toileting steps completed by patient: Adjust clothing prior to toileting Toileting steps completed by helper: Adjust clothing prior to toileting, Performs perineal hygiene, Adjust clothing after toileting Toileting Assistive Devices: Grab bar or rail  Toileting assist Assist level: Touching or steadying assistance (Pt.75%)   Transfers Chair/bed transfer   Chair/bed transfer method: Squat pivot, Stand pivot Chair/bed transfer assist level: Touching or steadying assistance (Pt > 75%) Chair/bed transfer assistive device: Armrests, Bedrails     Locomotion Ambulation     Max distance: 30 ft Assist level: Moderate assist (Pt 50 - 74%)   Wheelchair   Type: Manual Max wheelchair distance: 150 ft Assist Level: Supervision or verbal cues  Cognition Comprehension Comprehension assist level: Follows basic conversation/direction with extra time/assistive device  Expression Expression assist level: Expresses basic needs/ideas: With no assist  Social Interaction Social Interaction assist level: Interacts appropriately 75 - 89% of the time - Needs redirection for appropriate language or to initiate interaction.  Problem Solving Problem solving assist level: Solves basic 75 - 89% of the time/requires cueing 10 - 24% of the time  Memory Memory assist level: Recognizes or recalls 75 - 89% of the time/requires cueing 10 - 24% of the time   Medical Problem List and Plan: 1.  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  DVT Prophylaxis/Anticoagulation: Subcutaneous heparin. Monitor platelet counts and any signs of bleeding 3. Pain Management/chronic back pain: Neurontin 300 mg 3 times a day, hydrocodone as needed 4. Mood/PTSD/paranoid personality disorder: Lexapro 20 mg daily, Seroquel 100 mg daily, Valium 10 mg TID  -home valium regimen is 10-10-20mg    -neuropsych input appreciated   - seroquel for paranoid,  agitated behavior   -pt currently on 50-50-100mg  schedule  -behavior generally more even keeled---still with poor safety awareness 5. Neuropsych: This patient is not fully capable of making decisions on his own behalf. 6. Skin/Wound Care: Routine skin checks 7. Fluids/Electrolytes/Nutrition: encourage PO 8. Hypertension. Minipress 5 mg daily at bedtime,Ziac 10-6..25 mg daily. Vitals:   01/13/17 0720 01/13/17 0841  BP: 127/82 122/60  Pulse: 72 78  Resp: 18 18  Temp: 97.9 F (36.6 C) 97.8 F (36.6 C)  SpO2: 98% 100%   -controlled at present 9/18 9. Hyperlipidemia. Lipitor 10. Rhabdomyolysis.   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 Echocardiogram reviewed per cardiology services troponin negative. No further plans for cardiac workup 12. Leukocytosis with possible pneumonia. Empiric Augmentin added 12/26/2016 which has completed    13. Tobacco abuse. Counseling as appropriate   LOS (Days) 18 A FACE TO FACE EVALUATION WAS PERFORMED  Meredith Staggers, MD 01/13/2017 9:23 AM

## 2017-01-13 NOTE — Plan of Care (Signed)
Problem: RH Toilet Transfers Goal: LTG Patient will perform toilet transfers w/assist (OT) LTG: Patient will perform toilet transfers with assist, with/without cues using equipment (OT)  Downgraded due to slow progress  Problem: RH Tub/Shower Transfers Goal: LTG Patient will perform tub/shower transfers w/assist (OT) LTG: Patient will perform tub/shower transfers with assist, with/without cues using equipment (OT)  Downgraded due to slow progress

## 2017-01-13 NOTE — Progress Notes (Signed)
Occupational Therapy Session Note  Patient Details  Name: Perry Cervantes MRN: [id number] Date of Birth: 17-Jan-1951  Today's Date: 01/13/2017 OT Individual Time: 1020-1120 OT Individual Time Calculation (min): 60 min    Short Term Goals: Week 2:  OT Short Term Goal 1 (Week 2): Pt will perofrm toilet transfer with mod A.  OT Short Term Goal 2 (Week 2): Pt will perform toileting with mod A for dynamic standing balance.  OT Short Term Goal 3 (Week 2): Pt will perform shower transfer with mod A.   Skilled Therapeutic Interventions/Progress Updates:    Tx focus on functional transfers and caregiver training.   Pt greeted in w/c with spouse Perry Cervantes present, agreeable to caregiver training. No c/o pain after fall earlier this AM. Pt self propelled 3/4 way to tub room. Educated spouse on squat pivot transfers to TTB with modeling and step by step instruction, including w/c setup and mgt. After OT completed several transfers with pt, spouse was provided with hands on practice. Mod-max cues from OT for safety, sequencing, proper w/c placement, and body positioning to protect Rt side. Also advised Perry Cervantes to squat lower to ground to minimize bending of back while providing physical assist. Pt losing balance 3 times to Rt side after transferring to bench, required OT and spouse assist to correct due to pts impulsivity with repositioning himself. Advised purchase of shower treads for tub bottom as well as for shower bench to minimize pt slipping forward while seated. Provided her with home measurement sheet for heights of common furniture/width of doorways (she "thinks" the w/c will fit in the bathroom). She verbalized understanding to bring this in to caregiver education tomorrow to go over with primary therapists. Provided pt with bath mitt to use during next shower, educated spouse on use for Rt NMR at home. Pt/spouse benefit from additional transfer/ADL retraining education. Pt was escorted back to room  and left with all needs within reach. Safety belt and lap tray applied.   Therapy Documentation Precautions:  Precautions Precautions: Fall Precaution Comments: PTSD, verbose, angry at adult children who aren't helping his wife Restrictions Weight Bearing Restrictions: No Vital Signs: Therapy Vitals Temp: 98 F (36.7 C) Temp Source: Oral Pulse Rate: 74 Resp: 18 BP: 99/74 Patient Position (if appropriate): Lying Oxygen Therapy SpO2: 100 % O2 Device: Not Delivered Pain: No c/o pain during tx    ADL:      See Function Navigator for Current Functional Status.   Therapy/Group: Individual Therapy  Shantele Reller A Tanetta Fuhriman 01/13/2017, 12:21 PM

## 2017-01-13 NOTE — Progress Notes (Signed)
Bed Alarm went off, RN went to the room, found pt on the floor. Asked pt what happen, he said he was going to the kitchen to get coffee and said he said he slid of Assessment was performed, vitals taken, pt assisted back to bed . family member notified by Nelma Rothman, MD notified,

## 2017-01-14 ENCOUNTER — Inpatient Hospital Stay (HOSPITAL_COMMUNITY): Payer: Self-pay | Admitting: Speech Pathology

## 2017-01-14 ENCOUNTER — Inpatient Hospital Stay (HOSPITAL_COMMUNITY): Payer: Medicare Other | Admitting: Physical Therapy

## 2017-01-14 ENCOUNTER — Inpatient Hospital Stay (HOSPITAL_COMMUNITY): Payer: Medicare Other | Admitting: Occupational Therapy

## 2017-01-14 MED ORDER — CLOPIDOGREL BISULFATE 75 MG PO TABS
75.0000 mg | ORAL_TABLET | Freq: Every day | ORAL | Status: DC
  Administered 2017-01-14 – 2017-01-15 (×2): 75 mg via ORAL
  Filled 2017-01-14 (×2): qty 1

## 2017-01-14 NOTE — Discharge Summary (Signed)
Discharge summary job # [id number]

## 2017-01-14 NOTE — Discharge Instructions (Signed)
Inpatient Rehab Discharge Instructions  [facility]UN[facility] MASTERSON Discharge date and time: No discharge date for patient encounter.   Activities/Precautions/ Functional Status: Activity: activity as tolerated Diet: regular diet Wound Care: none needed Functional status:  ___ No restrictions     ___ Walk up steps independently ___ 24/7 supervision/assistance   ___ Walk up steps with assistance ___ Intermittent supervision/assistance  ___ Bathe/dress independently ___ Walk with walker     _x__ Bathe/dress with assistance ___ Walk Independently    ___ Shower independently ___ Walk with assistance    ___ Shower with assistance ___ No alcohol     ___ Return to work/school ________    COMMUNITY REFERRALS UPON DISCHARGE:    Home Health:   PT     OT     ST                     Agency:  [place] Phone: [phone]  Medical Equipment/Items Ordered: hospital bed, wheelchair, tub bench                                                      Agency/Supplier:  [employer] @ [phone]      Special Instructions: No smoking or driving STROKE/TIA DISCHARGE INSTRUCTIONS SMOKING Cigarette smoking nearly doubles your risk of having a stroke & is the single most alterable risk factor  If you smoke or have smoked in the last 12 months, you are advised to quit smoking for your health.  Most of the excess cardiovascular risk related to smoking disappears within a year of stopping.  Ask you doctor about anti-smoking medications  [place] Quit Line: 1-800-QUIT NOW  Free Smoking Cessation Classes [phone]  CHOLESTEROL Know your levels; limit fat & cholesterol in your diet  Lipid Panel     Component Value Date/Time   CHOL 174 12/24/2016 0010   TRIG 167 (H) 12/24/2016 0010   HDL 34 (L) 12/24/2016 0010   CHOLHDL 5.1 12/24/2016 0010   VLDL 33 12/24/2016 0010   LDLCALC 107 (H) 12/24/2016 0010      Many patients benefit from treatment even if their cholesterol is at goal.  Goal:  Total Cholesterol (CHOL) less than 160  Goal:  Triglycerides (TRIG) less than 150  Goal:  HDL greater than 40  Goal:  LDL (LDLCALC) less than 100   BLOOD PRESSURE American Stroke Association blood pressure target is less that 120/80 mm/Hg  Your discharge blood pressure is:  BP: 129/76  Monitor your blood pressure  Limit your salt and alcohol intake  Many individuals will require more than one medication for high blood pressure  DIABETES (A1c is a blood sugar average for last 3 months) Goal HGBA1c is under 7% (HBGA1c is blood sugar average for last 3 months)  Diabetes: No known diagnosis of diabetes    Lab Results  Component Value Date   HGBA1C 4.9 12/24/2016     Your HGBA1c can be lowered with medications, healthy diet, and exercise.  Check your blood sugar as directed by your physician  Call your physician if you experience unexplained or low blood sugars.  PHYSICAL ACTIVITY/REHABILITATION Goal is 30 minutes at least 4 days per week  Activity: Increase activity slowly, Therapies: Physical Therapy: Home Health Return to work:   Activity decreases your risk of heart  attack and stroke and makes your heart stronger.  It helps control your weight and blood pressure; helps you relax and can improve your mood.  Participate in a regular exercise program.  Talk with your doctor about the best form of exercise for you (dancing, walking, swimming, cycling).  DIET/WEIGHT Goal is to maintain a healthy weight  Your discharge diet is: Diet Heart Room service appropriate? Yes; Fluid consistency: Thin  liquids Your height is:  Height: 5\' 9"  (175.3 cm) Your current weight is: Weight: 79.6 kg (175 lb 7.1 oz) Your Body Mass Index (BMI) is:  BMI (Calculated): 25.9  Following the type of diet specifically designed for you will help prevent another stroke.  Your goal weight range is:    Your goal Body Mass Index (BMI) is 19-24.  Healthy food habits can help reduce 3 risk factors for  stroke:  High cholesterol, hypertension, and excess weight.  RESOURCES Stroke/Support Group:  Call [phone]   STROKE EDUCATION PROVIDED/REVIEWED AND GIVEN TO PATIENT Stroke warning signs and symptoms How to activate emergency medical system (call 911). Medications prescribed at discharge. Need for follow-up after discharge. Personal risk factors for stroke. Pneumonia vaccine given:  Flu vaccine given:  My questions have been answered, the writing is legible, and I understand these instructions.  I will adhere to these goals & educational materials that have been provided to me after my discharge from the hospital.      My questions have been answered and I understand these instructions. I will adhere to these goals and the provided educational materials after my discharge from the hospital.  Patient/Caregiver Signature _______________________________ Date __________  Clinician Signature _______________________________________ Date __________  Please bring this form and your medication list with you to all your follow-up doctor's appointments.

## 2017-01-14 NOTE — Progress Notes (Signed)
Speech Language Pathology Daily Session Note  Patient Details  Name: Perry Cervantes MRN: [id number] Date of Birth: 11-22-1950  Today's Date: 01/14/2017 SLP Individual Time: 1345-1415 SLP Individual Time Calculation (min): 30 min  Short Term Goals: Week 3: SLP Short Term Goal 1 (Week 3): STG=LTGs  Skilled Therapeutic Interventions: Skilled treatment session focused on completion of patient and family education. The patient and his wife were educated in regards to the patient's current cognitive functioning and strategies to utilize at home to maximize overall safety and the importance of 24 hour supervision. Both verbalized understanding of all information. Patient left upright in wheelchair with wife present. Continue with current plan of care.      Function:  Cognition Comprehension Comprehension assist level: Follows complex conversation/direction with extra time/assistive device  Expression   Expression assist level: Expresses basic needs/ideas: With no assist  Social Interaction Social Interaction assist level: Interacts appropriately 75 - 89% of the time - Needs redirection for appropriate language or to initiate interaction.  Problem Solving Problem solving assist level: Solves basic 75 - 89% of the time/requires cueing 10 - 24% of the time  Memory Memory assist level: Recognizes or recalls 75 - 89% of the time/requires cueing 10 - 24% of the time    Pain No/Denies Pain   Therapy/Group: Individual Therapy  Sada Mazzoni 01/14/2017, 2:25 PM

## 2017-01-14 NOTE — Plan of Care (Signed)
Problem: RH Dressing Goal: LTG Patient will perform lower body dressing w/assist (OT) LTG: Patient will perform lower body dressing with assist, with/without cues in positioning using equipment (OT)  Outcome: Not Met (add Reason) Pt requires max A with task  Problem: RH Toileting Goal: LTG Patient will perform toileting w/assist, cues/equip (OT) LTG: Patient will perform toiletiing (clothes management/hygiene) with assist, with/without cues using equipment (OT)  Outcome: Not Met (add Reason) Max A for task

## 2017-01-14 NOTE — Plan of Care (Signed)
Problem: RH Balance Goal: LTG Patient will maintain dynamic sitting balance (PT) LTG:  Patient will maintain dynamic sitting balance with assistance during mobility activities (PT)  Outcome: Completed/Met Date Met: 01/14/17 intermittently  Problem: RH Bed Mobility Goal: LTG Patient will perform bed mobility with assist (PT) LTG: Patient will perform bed mobility with assistance, with/without cues (PT).  Outcome: Not Met (add Reason) 2/2 decreased core strength  Problem: RH Bed to Chair Transfers Goal: LTG Patient will perform bed/chair transfers w/assist (PT) LTG: Patient will perform bed/chair transfers with assistance, with/without cues (PT).  Outcome: Completed/Met Date Met: 01/14/17 Via squat pivot  Problem: RH Car Transfers Goal: LTG Patient will perform car transfers with assist (PT) LTG: Patient will perform car transfers with assistance (PT).  Outcome: Not Met (add Reason) 2/2 decreased strength & awareness  Problem: RH Furniture Transfers Goal: LTG Patient will perform furniture transfers w/assist (OT/PT LTG: Patient will perform furniture transfers  with assistance (OT/PT).  Outcome: Completed/Met Date Met: 01/14/17 Squat pivot bed<>w/c  Problem: RH Ambulation Goal: LTG Patient will ambulate in controlled environment (PT) LTG: Patient will ambulate in a controlled environment, # of feet with assistance (PT).  Outcome: Not Met (add Reason) 20 ft with rail in hallway; requires max assist 2/2 decreased strength  Problem: RH Wheelchair Mobility Goal: LTG Patient will propel w/c in controlled environment (PT) LTG: Patient will propel wheelchair in controlled environment, # of feet with assist (PT)  Outcome: Completed/Met Date Met: 01/14/17 150 ft wit L hemi technique Goal: LTG Patient will propel w/c in home environment (PT) LTG: Patient will propel wheelchair in home environment, # of feet with assistance (PT).  Outcome: Completed/Met Date Met: 01/14/17 50 ft per  OT Goal: LTG Patient will propel w/c in community environment (PT) LTG: Patient will propel wheelchair in community environment, # of feet with assist (PT)  Outcome: Not Met (add Reason) 100 ft; min assist 2/2 decreased strength  Problem: RH Stairs Goal: LTG Patient will ambulate up and down stairs w/assist (PT) LTG: Patient will ambulate up and down # of stairs with assistance (PT)  Outcome: Not Applicable Date Met: 87/57/97 Not safe to attempt at this time

## 2017-01-14 NOTE — Progress Notes (Signed)
[place] PHYSICAL MEDICINE & REHABILITATION     PROGRESS NOTE    Subjective/Complaints: In bed. Getting cleaned up. No new problems today. Still apologetic about behavior  ROS: pt denies nausea, vomiting, diarrhea, cough, shortness of breath or chest pain   Objective: Vital Signs: Blood pressure 130/74, pulse 64, temperature (!) 97.4 F (36.3 C), temperature source Oral, resp. rate 18, height 5\' 9"  (1.753 m), weight 79.6 kg (175 lb 7.1 oz), SpO2 96 %. No results found. No results for input(s): WBC, HGB, HCT, PLT in the last 72 hours. No results for input(s): NA, K, CL, GLUCOSE, BUN, CREATININE, CALCIUM in the last 72 hours.  Invalid input(s): CO CBG (last 3)  No results for input(s): GLUCAP in the last 72 hours.  Wt Readings from Last 3 Encounters:  01/02/17 79.6 kg (175 lb 7.1 oz)  12/23/16 79.4 kg (175 lb)  12/22/16 79.4 kg (175 lb)    Physical Exam:  Constitutional: He is oriented to person, place, and time. He appears well-developed and well-nourished.  HENT:  Head: Normocephalic and atraumatic.  Eyes: EOMI Neck: supple.  Cardiovascular: RRR without murmur. No JVD            Respiratory: CTA Bilaterally without wheezes or rales. Normal effort    GI: Soft. Bowel sounds are normal. He exhibits no distension.  Musculoskeletal: He exhibits no edema or tenderness.  Neurological: He is fairly alert and oriented to person, place, and time.  Motor: RUE;  2-/5 proximal , 1+/5 wrist/finger synergy movement--trace finger extension RLE: HF, KE 3+/5, ADF/PF tr to 1/5--stable  LUE/LLE: 5/5 proximal to distal DTRs 3+ RUE/RLE Skin: Skin is warm and dry.  Scattered abrasions present and healing Psychiatric: irritated and restless.    Assessment/Plan: 1. 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 Physiatrist is providing close team  supervision and 24 hour management of active medical problems listed below. Physiatrist and rehab team continue to assess barriers to discharge/monitor patient progress toward functional and medical goals.  Function:  Bathing Bathing position Bathing activity did not occur: Refused Position: Shower  Bathing parts Body parts bathed by patient: Right arm, Chest, Abdomen, Right upper leg, Left upper leg, Front perineal area, Left lower leg Body parts bathed by helper: Left arm, Buttocks, Right lower leg, Back  Bathing assist Assist Level: Touching or steadying assistance(Pt > 75%)      Upper Body Dressing/Undressing Upper body dressing   What is the patient wearing?: Pull over shirt/dress     Pull over shirt/dress - Perfomed by patient: Thread/unthread left sleeve, Put head through opening, Pull shirt over trunk Pull over shirt/dress - Perfomed by helper: Thread/unthread right sleeve   Button up shirt - Perfomed by helper: Thread/unthread right sleeve, Thread/unthread left sleeve, Pull shirt around back, Button/unbutton shirt    Upper body assist Assist Level:  (min A)      Lower Body Dressing/Undressing Lower body dressing   What is the patient wearing?: Pants, Shoes Underwear - Performed by patient: Thread/unthread left underwear leg Underwear - Performed by helper: Thread/unthread right underwear leg, Pull underwear up/down Pants- Performed by patient: Thread/unthread left pants leg Pants- Performed by helper: Thread/unthread right pants leg, Pull pants up/down Non-skid slipper socks- Performed by patient: Don/doff left sock Non-skid slipper socks- Performed by helper: Don/doff right sock     Shoes - Performed by patient: Don/doff left shoe Shoes - Performed  by helper: Don/doff right shoe, Don/doff left shoe          Lower body assist Assist for lower body dressing:  (max A)      Toileting Toileting Toileting activity did not occur: No continent bowel/bladder  event Toileting steps completed by patient: Adjust clothing prior to toileting Toileting steps completed by helper: Adjust clothing prior to toileting, Performs perineal hygiene, Adjust clothing after toileting Toileting Assistive Devices: Grab bar or rail  Toileting assist Assist level: Touching or steadying assistance (Pt.75%)   Transfers Chair/bed transfer   Chair/bed transfer method: Squat pivot, Stand pivot Chair/bed transfer assist level: Touching or steadying assistance (Pt > 75%) Chair/bed transfer assistive device: Armrests, Bedrails     Locomotion Ambulation     Max distance: 30 ft Assist level: Moderate assist (Pt 50 - 74%)   Wheelchair   Type: Manual Max wheelchair distance: 150 ft Assist Level: Supervision or verbal cues  Cognition Comprehension Comprehension assist level: Follows complex conversation/direction with extra time/assistive device  Expression Expression assist level: Expresses basic needs/ideas: With no assist  Social Interaction Social Interaction assist level: Interacts appropriately 75 - 89% of the time - Needs redirection for appropriate language or to initiate interaction.  Problem Solving Problem solving assist level: Solves basic 75 - 89% of the time/requires cueing 10 - 24% of the time  Memory Memory assist level: Recognizes or recalls 75 - 89% of the time/requires cueing 10 - 24% of the time   Medical Problem List and Plan: 1.  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  DVT Prophylaxis/Anticoagulation: Subcutaneous heparin. Monitor platelet counts and any signs of bleeding 3. Pain Management/chronic back pain: Neurontin 300 mg 3 times a day, hydrocodone as needed 4. Mood/PTSD/paranoid personality disorder: Lexapro 20 mg daily, Seroquel 100 mg daily, Valium 10 mg TID  -home valium regimen is 10-10-20mg    -neuropsych input appreciated   - seroquel for paranoid, agitated behavior   -pt  currently on 50-50-100mg  schedule--moved up morning dose to help am agitation    5. Neuropsych: This patient is not fully capable of making decisions on his own behalf. 6. Skin/Wound Care: Routine skin checks 7. Fluids/Electrolytes/Nutrition: encourage PO 8. Hypertension. Minipress 5 mg daily at bedtime,Ziac 10-6..25 mg daily. Vitals:   01/14/17 0300 01/14/17 0509  BP: 107/67 130/74  Pulse: 62 64  Resp: 18 18  Temp: 97.6 F (36.4 C) (!) 97.4 F (36.3 C)  SpO2: 97% 96%   -controlled at present 9/18 9. Hyperlipidemia. Lipitor 10. Rhabdomyolysis.   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 Echocardiogram reviewed per cardiology services troponin negative. No further plans for cardiac workup 12. Leukocytosis with possible pneumonia. Empiric Augmentin added 12/26/2016 which has completed    13. Tobacco abuse. Counseling as appropriate   LOS (Days) 19 A FACE TO FACE EVALUATION WAS PERFORMED  Alger Simons T, MD 01/14/2017 1:25 PM

## 2017-01-14 NOTE — Progress Notes (Signed)
Occupational Therapy Discharge Summary  Patient Details  Name: Perry Cervantes MRN: [id number] Date of Birth: [birth year]  Today's Date: 01/14/2017 OT Individual Time: 0800-0900 and 0160-1093 OT Individual Time Calculation (min): 60 min and 42 min    Patient has met 9 of 11 long term goals due to improved activity tolerance, improved balance, ability to compensate for deficits, functional use of  RIGHT upper and RIGHT lower extremity, improved attention, improved awareness and improved coordination.  Patient to discharge at overall S- mod A level.  Patient's care partner is independent to provide the necessary physical and cognitive assistance at discharge.    Reasons goals not met: Pt requires max A for toileting and LB dressing tasks  Recommendation:  Patient will benefit from ongoing skilled OT services in home health setting to continue to advance functional skills in the area of BADL.  Equipment: TTB, 3 in 1 commode chair, hospital bed  Reasons for discharge: treatment goals met  Patient/family agrees with progress made and goals achieved: Yes   OT Intervention: Session 1: Upon entering the room, pt supine in bed with no c/o pain this session. Pt performed bed mobility with mod A to EOB. Pt seated with close supervision for balance while eating with set up a to open containers. Pt donning pants from EOB with max A and verbal cues for hemiplegic technique. Pt transferred into new wheelchair with min A squat pivot transfer. OT adjusting wheelchair appropriately for pt. Pt seated in chair with quick release belt donned , R UE lap tray donned, and call bell within reach upon exiting the room.   Session 2: Pt seated in wheelchair with wife present in the room to complete family education. Pt propelled wheelchair with hemiplegic technique to therapy gym with supervision. Pt demonstrated transfer from wheelchair <> car set at sedan height as well as SUV height.  Pt performed transfer with  min A and communicating effectively with wife for safe transfer. Wife stating, " That seems much better." OT provided education to wife regarding management of wheelchair parts with her returning demonstrations. Pt returning to room in same manner. OT educated caregiver on [place] recommendations in which pt and caregiver verbalized understanding and agreement. Call bell and all needed items within reach upon exiting the room.   OT Discharge Precautions/Restrictions  Restrictions Weight Bearing Restrictions: No Pain Pain Assessment Pain Assessment: No/denies pain Vision Baseline Vision/History: Wears glasses Wears Glasses: Reading only Cognition Overall Cognitive Status: Difficult to assess Arousal/Alertness: Awake/alert Orientation Level: Oriented to person;Oriented to place Memory: Impaired Memory Impairment: Decreased short term memory;Decreased recall of new information Awareness: Impaired Awareness Impairment: Anticipatory impairment Behaviors: Impulsive;Perseveration Safety/Judgment: Impaired Sensation Coordination Gross Motor Movements are Fluid and Coordinated: No Fine Motor Movements are Fluid and Coordinated: No Motor  Motor Motor: Hemiplegia Motor - Skilled Clinical Observations: general weakness Mobility  Bed Mobility Bed Mobility: Supine to Sit;Sit to Supine Supine to Sit: 3: Mod assist;2: Max assist Sit to Supine: 3: Mod assist;2: Max assist Transfers Sit to Stand: 3: Mod assist  Trunk/Postural Assessment  Postural Control Postural Control: Deficits on evaluation  Balance Balance Balance Assessed: Yes Static Sitting Balance Static Sitting - Level of Assistance: 5: Stand by assistance Dynamic Sitting Balance Dynamic Sitting - Level of Assistance: 5: Stand by assistance Static Standing Balance Static Standing - Level of Assistance: 4: Min assist Dynamic Standing Balance Dynamic Standing - Level of Assistance: 3: Mod assist Extremity/Trunk Assessment RUE  Assessment RUE Assessment: Exceptions to [facility] (2-/5 throughout)  LUE Assessment LUE Assessment: Within Functional Limits   See Function Navigator for Current Functional Status.  Darleen Crocker P 01/14/2017, 2:15 PM

## 2017-01-14 NOTE — Discharge Summary (Signed)
NAME:  Perry Cervantes, Perry Cervantes NO.:  [id number]  MEDICAL RECORD NO.:  [id number]  LOCATION:  [id number]                        FACILITY:  [facility]  PHYSICIAN:  Perry Cervantes, M.D.DATE OF BIRTH:  1950-09-24  DATE OF ADMISSION:  12/26/2016 DATE OF DISCHARGE:  01/15/2017                              DISCHARGE SUMMARY   DISCHARGE DIAGNOSES: 1. Left corona radiata infarction. 2. Subcutaneous heparin for DVT prophylaxis. 3. Pain management. 4. 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 5. Hypertension. 6. Hyperlipidemia. 7. Rhabdomyolysis, improved. 8. Left bundle-branch block with tachycardia. 9. Tobacco abuse.  HISTORY OF PRESENT ILLNESS:  This is a 66 year old right-handed male with history of chronic back pain, PTSD, tobacco abuse, who lives with his wife, used an occasional cane prior to admission.  Wife with recent fall, can provide but limited physical assistance.  Presented on December 23, 2016, with multiple falls, right-sided weakness.  Cranial CT scan negative.  CT of cervical spine showed some central disk protrusion at C4-5, C5-7.  MRI of the brain showed a 2-mm acute infarction left corona radiata.  Echocardiogram with ejection fraction of 65%.  No wall motion abnormalities.  The patient did not receive tPA. Carotid Dopplers with no ICA stenosis.  White blood cell count 16,200. CK 11,000 as well as tachycardia on admission.  EKG, left bundle-branch block.  Troponin negative.  Follow up with cardiology Services.  No further workup indicated.  MRA of the brain, no anterior circulation stenosis or branch occlusion noted.  Urine drug screen positive for benzos.  CK trending down to 2734.  Neurology consulted, placed on aspirin for CVA prophylaxis changed to plavix.  Subcutaneous heparin for DVT prophylaxis. The patient was completing a course of Augmentin for some suspected bronchitis.  Physical and occupational therapy ongoing.  The patient  was admitted for comprehensive rehab program.  PAST MEDICAL HISTORY:  See discharge diagnoses.  SOCIAL HISTORY:  He lives with wife.  Used occasional cane prior to admission.  FUNCTIONAL STATUS UPON ADMISSION TO REHAB SERVICES:  Moderate assist stand pivot transfers, moderate assist squat fit transfers, max to total assist activities of daily living.  PHYSICAL EXAMINATION:  VITAL SIGNS:  Blood pressure 152/70, pulse 101, temperature 98, respirations 18. GENERAL:  Alert male.  Showed some perseveration behavior as well as tangential. HEENT:  EOMs intact. NECK:  Supple.  Nontender.  No JVD. CARDIAC:  Rate controlled. ABDOMEN:  Soft, nontender.  Good bowel sounds. LUNGS:  Clear to auscultation without wheeze.  REHABILITATION HOSPITAL COURSE:  The patient was admitted to Inpatient Rehab Services with therapies initiated on a 3-hour daily basis consisting of physical therapy, occupational therapy, and rehabilitation nursing.  The following issues were addressed during the patient's rehabilitation stay.  Pertaining to Mr. Perry Cervantes' left corona radiata infarction, remained stable.  He would follow up with Neurology Services.  Subcutaneous heparin for DVT prophylaxis.  Chronic back pain, he was continued on Neurontin as well as hydrocodone as needed.  The patient with long documented history of PTSD, paranoid personality.  He continued on Lexapro, Seroquel, and Valium.  His home Valium regimen was 01-31-19 mg.  Seroquel ongoing for paranoid, agitated behavior.  He was followed by Neuropsychology.  Blood pressure is  well controlled on current regimen.  He would follow up with his primary MD. Hyperlipidemia, with Lipitor.  Hospital admission findings of left bundle-branch block.  Echocardiogram reviewed per Cardiology Services. Troponin negative.  No further workup was indicated.  He did have a history of tobacco abuse, receiving full counseling in regard to cessation of nicotine  products.  It was questionable if he would be compliant with these requests.  The patient received weekly collaborative interdisciplinary team conferences to discuss estimated length of stay, family teaching, any barriers to his discharge. Practice transfers going wheelchair to chair with minimal guard to moderate assist depending on fatigue level.  Needed some frequent redirection due to distractions.  He could propel his wheelchair with supervision.  Transfers onto the mat with max assist.  Transferred wheelchair to mat and table min mod assist, depending on fatigue levels. The patient with great difficulty extending his hips, shifting his pelvis anteriorly, working with tall kneeling.  The patient engaged in peg board tasks with minimal cues.  Again, he was followed very closely by Neuropsychology for his history of PTSD.  Full family teaching was completed and plan discharge to home.  DISCHARGE MEDICATIONS: 1. Plavix 75 mg daily. 2. Lipitor 40 mg p.o. daily. 3. Ziac 10-6.25 mg daily. 4. Valium 10 mg p.o. b.i.d. 5. Lexapro 20 mg daily. 6. Pepcid 10 mg b.i.d. 7. Neurontin 300 mg p.o. t.i.d. 8. Minipress 5 mg p.o. at bedtime. 9. Seroquel 100 mg at bedtime, 50 mg p.o. b.i.d. 10.Senokot-S 1 tablet at bedtime.  DIET:  His diet was regular.  FOLLOWUP:  The patient would follow up with Dr. Alger Cervantes at the Outpatient Rehab Service office as advised; Dr. Satira Cervantes, Cardiology Service, call for appointment; Dr. Antony Cervantes, Neurology Service, call for appointment for 6 weeks; Dr. Manuella Cervantes, Medical Management.  SPECIAL INSTRUCTIONS:  No smoking or driving.     Lauraine Rinne, P.A.   ______________________________ Perry Cervantes, M.D.    DA/MEDQ  D:  01/14/2017  T:  01/14/2017  Job:  [id number]  cc:   Pramod P. Leonie Man, MD Perry Sark, MD Monico Blitz, MD Perry Cervantes, M.D.

## 2017-01-14 NOTE — Progress Notes (Signed)
Physical Therapy Discharge Summary  Patient Details  Name: Perry Cervantes MRN: [id number] Date of Birth: [birth year]  Today's Date: 01/14/2017 PT Individual Time: 4287-6811 PT Individual Time Calculation (min): 30 min    Patient has met 6 of 10 long term goals due to improved activity tolerance, improved balance, improved postural control, increased strength, ability to compensate for deficits and improved coordination.  Patient to discharge at a wheelchair level Supervision with assistance to manage w/c parts, and min assist for squat pivot transfers.   Patient's wife Horris Latino) participated in hands on training last week during PT session and again today during OT session - she denies any concerns regarding d/c home with pt.  Reasons goals not met: decreased strength & safety awareness  Recommendation:  Patient will benefit from ongoing skilled PT services in home health setting to continue to advance safe functional mobility, address ongoing impairments in decreased neuromuscular control (RUE, RLE), impaired sitting and standing balance, decreased safety awareness, decreased transfers and bed mobility, gait training, and minimize fall risk.  Equipment: 18x18 w/c with R half lap tray, hospital bed  Reasons for discharge: treatment goals met and discharge from hospital  Patient/family agrees with progress made and goals achieved: Yes  Skilled PT Treatment: Pt off of unit with wife and missed initial 45 minutes of session. Once pt returned to unit pt's wife Horris Latino) reported she had to go home since pt's hospital bed was being delivered. Pt & Horris Latino reported no questions or concerns regarding d/c home tomorrow. Pt propelled w/c room<>gym with L hemi technique and supervision. Pt completes sit<>stand at rail in hallway with mod assist. Pt ambulates 20 ft with max assist with therapist providing assistance for RLE advancement and facilitation to prevent knee hyperextension. Pt with poor RLE  foot clearance 2/2 impaired dorsiflexion and requires cuing for safety with task. At end of session pt left sitting in w/c in room with QRB & R half lap tray donned, all needs within reach. Pt very lethargic throughout session with RN reporting he was premedicated.  Pt reports "I hurt a little bit" in my R hip.   PT Discharge Precautions/Restrictions Precautions Precautions: Fall Precaution Comments: R hemiplegia Restrictions Weight Bearing Restrictions: No   Cognition Overall Cognitive Status: Difficult to assess Orientation Level: Oriented to person;Oriented to place Memory: Impaired Memory Impairment: Decreased short term memory;Decreased recall of new information Awareness: Impaired Awareness Impairment: Anticipatory impairment Behaviors: Impulsive;Perseveration Safety/Judgment: Impaired  Motor  Motor Motor: Hemiplegia Motor - Skilled Clinical Observations: general weakness   Mobility Bed Mobility Bed Mobility: Supine to Sit;Sit to Supine Supine to Sit: 3: Mod assist;2: Max assist Sit to Supine: 3: Mod assist;2: Max assist Transfers Transfers: Yes Sit to Stand: 3: Mod assist (pulling up on rail in hallway) Squat Pivot Transfers: 4: Min assist Squat Pivot Transfer Details: Tactile cues for sequencing;Tactile cues for weight shifting;Tactile cues for posture;Tactile cues for placement;Manual facilitation for weight shifting;Manual facilitation for placement  Locomotion  Ambulation Ambulation: Yes Ambulation/Gait Assistance: 2: Max assist Ambulation Distance (Feet): 20 Feet Assistive device: Other (Comment) (rail in hallway) Ambulation/Gait Assistance Details: Manual facilitation for weight shifting;Manual facilitation for placement;Verbal cues for gait pattern;Verbal cues for sequencing;Tactile cues for sequencing;Tactile cues for weight shifting;Tactile cues for posture;Tactile cues for placement Gait Gait: Yes Gait Pattern: Decreased stride length;Decreased stance  time - right;Decreased hip/knee flexion - right;Decreased dorsiflexion - right;Right genu recurvatum Stairs / Additional Locomotion Stairs: No Wheelchair Mobility Wheelchair Mobility: Yes Wheelchair Assistance: 5: Careers information officer: Left  upper extremity;Left lower extremity Wheelchair Parts Management: Needs assistance Distance: 150 ft   Trunk/Postural Assessment  Postural Control Postural Control: Deficits on evaluation   Extremity Assessment  LUE Assessment LUE Assessment: Within Functional Limits RLE Assessment RLE Assessment:  (PROM WFL) LLE Assessment LLE Assessment: Within Functional Limits   See Function Navigator for Current Functional Status.  [name] 01/14/2017, 4:11 PM

## 2017-01-15 ENCOUNTER — Telehealth (HOSPITAL_COMMUNITY): Payer: Self-pay | Admitting: *Deleted

## 2017-01-15 MED ORDER — BISOPROLOL-HYDROCHLOROTHIAZIDE 10-6.25 MG PO TABS: 1.0000 | ORAL_TABLET | Freq: Every day | ORAL | 0 refills | Status: DC

## 2017-01-15 MED ORDER — ATORVASTATIN CALCIUM 40 MG PO TABS: 40.0000 mg | ORAL_TABLET | Freq: Every day | ORAL | 0 refills | Status: DC

## 2017-01-15 MED ORDER — CLOPIDOGREL BISULFATE 75 MG PO TABS: 75.0000 mg | ORAL_TABLET | Freq: Every day | ORAL | 0 refills | Status: DC

## 2017-01-15 MED ORDER — PRAZOSIN HCL 5 MG PO CAPS: 5.0000 mg | ORAL_CAPSULE | Freq: Every day | ORAL | 2 refills | Status: DC

## 2017-01-15 MED ORDER — ESCITALOPRAM OXALATE 20 MG PO TABS: 20.0000 mg | ORAL_TABLET | Freq: Every day | ORAL | 0 refills | Status: DC

## 2017-01-15 MED ORDER — GABAPENTIN 300 MG PO CAPS
300.0000 mg | ORAL_CAPSULE | Freq: Three times a day (TID) | ORAL | 0 refills | Status: DC
Start: 2017-01-15 — End: 2017-02-12

## 2017-01-15 MED ORDER — QUETIAPINE FUMARATE 50 MG PO TABS: ORAL_TABLET | ORAL | 0 refills | Status: AC

## 2017-01-15 MED ORDER — DIAZEPAM 10 MG PO TABS: 10.0000 mg | ORAL_TABLET | Freq: Two times a day (BID) | ORAL | 0 refills | Status: DC

## 2017-01-15 MED ORDER — DIAZEPAM 10 MG PO TABS: 10.0000 mg | ORAL_TABLET | Freq: Two times a day (BID) | ORAL | 0 refills | Status: AC

## 2017-01-15 NOTE — Telephone Encounter (Signed)
Pt called office very upset stating he needs more Valium. Per pt he just got out of the hospital today with a heart attack. Per pt while he was in the hospital, they provided him with Valium 10 mg and they did not call in any refills for him. Per pt he wants Dr. Harrington Challenger to call in this medication for him. Per pt chart, Valium was No Printed on 12-26-2016. Informed pt message will be given to provider. [phone].

## 2017-01-15 NOTE — Progress Notes (Signed)
[facility]ICINE [facility] RE[facility]ABILITATION     PROGRESS NOTE    Subjective/Complaints: Slept well. No new issues early this morning.   ROS: pt denies nausea, vomiting, diarrhea, cough, shortness of breath or chest pain   Objective: Vital Signs: Blood pressure 120/82, pulse 74, temperature (!) 97.5 F (36.4 C), temperature source Oral, resp. rate 10, height 5\' 9"  (1.753 m), weight 79.6 kg (175 lb 7.1 oz), SpO2 100 %. No results found. No results for input(s): WBC, HGB, HCT, PLT in the last 72 hours. No results for input(s): NA, K, CL, GLUCOSE, BUN, CREATININE, CALCIUM in the last 72 hours.  Invalid input(s): CO CBG (last 3)  No results for input(s): GLUCAP in the last 72 hours.  Wt Readings from Last 3 Encounters:  01/02/17 79.6 kg (175 lb 7.1 oz)  12/23/16 79.4 kg (175 lb)  12/22/16 79.4 kg (175 lb)    Physical Exam:  Constitutional: He is oriented to person, place, and time. He appears well-developed and well-nourished.  HENT:  Head: Normocephalic and atraumatic.  Eyes: EOMI Neck: supple.  Cardiovascular: RRR without murmur. No JVD            Respiratory: CTA Bilaterally without wheezes or rales. Normal effort    GI: Soft. Bowel sounds are normal. He exhibits no distension.  Musculoskeletal: He exhibits no edema or tenderness.  Neurological: He is fairly alert and oriented to person, place, and time.  Motor: RUE;  2-/5 proximal , 1+/5 wrist/finger synergy movement--trace finger extension only still.  RLE: HF, KE 3+/5, ADF/PF tr to 1/5--stable  LUE/LLE: 5/5 proximal to distal DTRs 3+ RUE/RLE Skin: Skin is warm and dry.  Scattered abrasions present and healing Psychiatric: a little anxious    Assessment/Plan: 1. 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 Physiatrist is providing close team supervision and 24 hour  management of active medical problems listed below. Physiatrist and rehab team continue to assess barriers to discharge/monitor patient progress toward functional and medical goals.  Function:  Bathing Bathing position Bathing activity did not occur: Refused Position: Shower (per staff report)  Bathing parts Body parts bathed by patient: Right arm, Chest, Abdomen, Right upper leg, Left upper leg, Front perineal area, Left lower leg Body parts bathed by helper: Left arm, Buttocks, Right lower leg  Bathing assist Assist Level:  (mod A)      Upper Body Dressing/Undressing Upper body dressing   What is the patient wearing?: Pull over shirt/dress     Pull over shirt/dress - Perfomed by patient: Thread/unthread left sleeve, Put head through opening, Pull shirt over trunk Pull over shirt/dress - Perfomed by helper: Thread/unthread right sleeve   Button up shirt - Perfomed by helper: Thread/unthread right sleeve, Thread/unthread left sleeve, Pull shirt around back, Button/unbutton shirt    Upper body assist Assist Level: Touching or steadying assistance(Pt > 75%)      Lower Body Dressing/Undressing Lower body dressing   What is the patient wearing?: Pants, Shoes Underwear - Performed by patient: Thread/unthread left underwear leg Underwear - Performed by helper: Thread/unthread right underwear leg, Pull underwear up/down Pants- Performed by patient: Thread/unthread left pants leg Pants- Performed by helper: Thread/unthread right pants leg, Pull pants up/down Non-skid slipper socks- Performed by patient: Don/doff left sock Non-skid slipper socks- Performed by helper: Don/doff right sock     Shoes - Performed by patient: Don/doff left shoe Shoes -  Performed by helper: Don/doff right shoe, Don/doff left shoe          Lower body assist Assist for lower body dressing:  (max A)      Toileting Toileting Toileting activity did not occur: No continent bowel/bladder event Toileting  steps completed by patient: Adjust clothing prior to toileting Toileting steps completed by helper: Performs perineal hygiene, Adjust clothing after toileting Toileting Assistive Devices: Grab bar or rail  Toileting assist Assist level:  (max A)   Transfers Chair/bed transfer   Chair/bed transfer method: Squat pivot, Stand pivot Chair/bed transfer assist level: Touching or steadying assistance (Pt > 75%) Chair/bed transfer assistive device: Armrests, Bedrails     Locomotion Ambulation     Max distance: 20 ft Assist level: Maximal assist (Pt 25 - 49%)   Wheelchair   Type: Manual Max wheelchair distance: 150 ft (L hemi technique) Assist Level: Supervision or verbal cues  Cognition Comprehension Comprehension assist level: Follows complex conversation/direction with extra time/assistive device  Expression Expression assist level: Expresses basic needs/ideas: With no assist  Social Interaction Social Interaction assist level: Interacts appropriately 75 - 89% of the time - Needs redirection for appropriate language or to initiate interaction.  Problem Solving Problem solving assist level: Solves basic 75 - 89% of the time/requires cueing 10 - 24% of the time  Memory Memory assist level: Recognizes or recalls 75 - 89% of the time/requires cueing 10 - 24% of the time   Medical Problem List and Plan: 1.  Right hemiparesis secondary to left corona radiata infarct  -continue PT, OT, SLP   -SNFpending 2.  DVT Prophylaxis/Anticoagulation: Subcutaneous heparin. Monitor platelet counts and any signs of bleeding 3. Pain Management/chronic back pain: Neurontin 300 mg 3 times a day, hydrocodone as needed 4. Mood/PTSD/paranoid personality disorder: Lexapro 20 mg daily, Seroquel 100 mg daily, Valium 10 mg TID  -home valium regimen is 10-10-20mg    -neuropsych input appreciated   - seroquel for paranoid, agitated behavior   -pt currently on 50-50-100mg  schedule--moved up morning dose to help am  agitation.    5. Neuropsych: This patient is not fully capable of making decisions on his own behalf. 6. Skin/Wound Care: Routine skin checks 7. Fluids/Electrolytes/Nutrition: encourage PO 8. Hypertension. Minipress 5 mg daily at bedtime,Ziac 10-6..25 mg daily. Vitals:   01/14/17 1430 01/15/17 0620  BP:  120/82  Pulse:  74  Resp: 18 10  Temp: 97.6 F (36.4 C) (!) 97.5 F (36.4 C)  SpO2: 96% 100%   -controlled at present 9/18 9. Hyperlipidemia. Lipitor 10. Rhabdomyolysis.   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 Echocardiogram reviewed per cardiology services troponin negative. No further plans for cardiac workup 12. Leukocytosis with possible pneumonia. Empiric Augmentin added 12/26/2016 which has completed    13. Tobacco abuse. Counseling as appropriate   LOS (Days) 20 A FACE TO FACE EVALUATION WAS PERFORMED  Meredith Staggers, MD 01/15/2017 9:01 AM

## 2017-01-15 NOTE — Progress Notes (Signed)
Pt given discharge instructions via Lauraine Rinne, PAC. Pt discharged to home with wife and all belongings.

## 2017-01-15 NOTE — Telephone Encounter (Signed)
Please call in valium 10 mg, # 60 one bid, no refills and make sure he has appt with me

## 2017-01-15 NOTE — Telephone Encounter (Signed)
Per Dr. Harrington Challenger to call in pt Valium 10 mg BID 60 tabs 0 refill. Called pt to verify his pharmacy and pt stated [facility]. Called pt pharmacy and spoke with Ovid Curd and he verbalized understanding. Pt is aware that staff will be calling in refills.

## 2017-01-15 NOTE — Progress Notes (Signed)
Speech Language Pathology Discharge Summary  Patient Details  Name: Perry Cervantes MRN: [id number] Date of Birth: Dec 24, 1950  Patient has met 3 of 3 long term goals.  Patient to discharge at [facility] level.   Reasons goals not met: N/A   Clinical Impression/Discharge Summary: Patient has made functional gains and has met 3 of 3 LTG's this admission. Currently, patient requires overall Min A verbal cues to complete functional and familiar tasks safely in regards to problem solving, recall and awareness. Patient and family education is complete and patient will discharge home with 24 hour supervision. Patient would benefit from f/u SLP services to maximize his cognitive function and overall functional independence in order to reduce caregiver burden.   Care Partner:  Caregiver Able to Provide Assistance: Yes  Type of Caregiver Assistance: Physical;Cognitive  Recommendation:  24 hour supervision/assistance;Home Health SLP  Rationale for SLP Follow Up: Maximize cognitive function and independence;Reduce caregiver burden   Equipment: N/A    Reasons for discharge: Discharged from hospital;Treatment goals met   Patient/Family Agrees with Progress Made and Goals Achieved: Yes     Krystle Oberman, [place] 01/15/2017, 7:00 AM

## 2017-01-15 NOTE — Telephone Encounter (Signed)
Pt is aware and medication was called into his pharmacy. Spoke with Ovid Curd

## 2017-01-15 NOTE — Telephone Encounter (Signed)
Per previous note on file and previous refill for pt Valium, staff meant to put pt script on phoned in and left it as No Print. This note is to document that script was changed to phoned in and script was reentered in chart.

## 2017-01-16 ENCOUNTER — Telehealth: Payer: Self-pay | Admitting: *Deleted

## 2017-01-16 MED ORDER — ATORVASTATIN CALCIUM 40 MG PO TABS: 40.0000 mg | ORAL_TABLET | Freq: Every day | ORAL | 0 refills | Status: DC

## 2017-01-16 NOTE — Telephone Encounter (Signed)
Mrs Perry Cervantes called and reported that the Lipitor on discharge list was not given to them.  It looks like it was on "no print" when meds ordered. Reordered and sent to [facility].  He is also to be a transitional care call. See following message.

## 2017-01-16 NOTE — Telephone Encounter (Addendum)
Transitional Care call-Transitional Care call-spoke with wife Horris Latino    1. Are you/is patient experiencing any problems since coming home? Are there any questions regarding any aspect of care? No 2. Are there any questions regarding medications administration/dosing? Are meds being taken as prescribed? Patient should review meds with caller to confirm.  He has everything but his lipitor which did not get printed at discharge.  I have sent that to [facility] and let Mrs [name] know that those medications from hospital discharge will be taken over by primary care for future refills. 3. Have there been any falls? Yes he has fallen twice.  No injury. PT aware 4. Has Home Health been to the house and/or have they contacted you? If not, have you tried to contact them? Can we help you contact them? Yes they have been visited by all disciplines 5. Are bowels and bladder emptying properly? Are there any unexpected incontinence issues? If applicable, is patient following bowel/bladder programs? No 6. Any fevers, problems with breathing, unexpected pain? No 7. Are there any skin problems or new areas of breakdown? No 8. Has the patient/family member arranged specialty MD follow up (ie cardiology/neurology/renal/surgical/etc)?  Can we help arrange? appt given to see Dr Naaman [name] 9. Does the patient need any other services or support that we can help arrange? No 10. Are caregivers following through as expected in assisting the patient? Yes 11. Has the patient quit smoking, drinking alcohol, or using drugs as recommended? n/a  Appointment Monday 01/28/17 @11 :00 arrive by 10:30 Address reviewed [address]    Appointment Monday 01/28/17 @11 :00 arrive by 10:30 to see Dr Naaman [name] [address]

## 2017-01-16 NOTE — Progress Notes (Signed)
Social Work  Discharge Note  The overall goal for the admission was met for:   Discharge location: Yes - home with wife who assures she has arranged for daily private duty assistance and will cover 24/7 assistance for pt.  Length of Stay: Yes - 20 days  Discharge activity level: Yes - min assist overall  Home/community participation: Yes  Services provided included: MD, RD, PT, OT, SLP, RN, TR, Pharmacy, [employer]: Medicare  Follow-up services arranged: Home Health: PT, OT, ST via [facility], DME: 18x18 lightweight w/c with right 1/2 lap tray, basic cushion, hospital bed,tub bench via [place] and Patient/Family has no preference for HH/DME agencies  Comments (or additional information):  Patient/Family verbalized understanding of follow-up arrangements: Yes  Individual responsible for coordination of the follow-up plan: pt/wife  Confirmed correct DME delivered: Arvind Mexicano 01/16/2017    Cordarrel Stiefel

## 2017-01-28 ENCOUNTER — Encounter: Payer: Medicare Other | Admitting: Physical Medicine & Rehabilitation

## 2017-01-29 ENCOUNTER — Telehealth (HOSPITAL_COMMUNITY): Payer: Self-pay | Admitting: Psychiatry

## 2017-01-29 NOTE — Telephone Encounter (Signed)
Patient called in stating that he was prescribed Valium 4x/day but does not have enough medication. Patient would like a call back at [phone].

## 2017-01-30 NOTE — Telephone Encounter (Signed)
Per Dr. Harrington Challenger to Please call in valium 10 mg, # 60 one bid, no refills and make sure he has appt with me. Per pt previous message from 01-29-2017, he is taking medication QID when staff informed him with new direction during previous phone conversation and pt verbalized understanding during that time.

## 2017-01-30 NOTE — Telephone Encounter (Signed)
Staff called pt back 01-30-2017 at 9:33am on the number he provided during last message [phone]. Staff unable to reach pt and lmtcb to sch f/u app and to discuss previous message.

## 2017-02-08 NOTE — Telephone Encounter (Signed)
Staff called pt pharmacy to get more information about pt Valium. Per Ovid Curd pt should have 6 more days left of his Valium. Per Ovid Curd pt Valium was last picked up on 01-15-2017. Informed Ovid Curd that pt needs a f/u appt before they request refills for pt Valium. Staff called pt at [phone] and was unable to reach pt. Staff lmtcb to sch appt. Per pt chart, pt do not have any f/u appt with provider. Staff provided office number and hours on pt voicemail.

## 2017-02-12 ENCOUNTER — Other Ambulatory Visit: Payer: Self-pay | Admitting: Physical Medicine & Rehabilitation

## 2017-02-12 NOTE — Telephone Encounter (Signed)
1 month courtesy refill for these medications

## 2017-02-12 NOTE — Telephone Encounter (Signed)
1 month curtesy refill

## 2017-02-12 NOTE — Telephone Encounter (Signed)
1 month curtosey refill

## 2017-02-13 ENCOUNTER — Other Ambulatory Visit: Payer: Self-pay | Admitting: Physical Medicine & Rehabilitation

## 2017-02-13 NOTE — Telephone Encounter (Signed)
1 month courtesy refill.

## 2017-02-20 ENCOUNTER — Encounter: Payer: Self-pay | Admitting: Physical Medicine & Rehabilitation

## 2017-02-20 ENCOUNTER — Encounter: Payer: Medicare Other | Attending: Physical Medicine & Rehabilitation | Admitting: Physical Medicine & Rehabilitation

## 2017-02-20 VITALS — BP 110/68 | HR 75

## 2017-02-20 DIAGNOSIS — I10 Essential (primary) hypertension: Secondary | ICD-10-CM | POA: Diagnosis not present

## 2017-02-20 DIAGNOSIS — T796XXS Traumatic ischemia of muscle, sequela: Secondary | ICD-10-CM | POA: Diagnosis not present

## 2017-02-20 DIAGNOSIS — F329 Major depressive disorder, single episode, unspecified: Secondary | ICD-10-CM | POA: Diagnosis not present

## 2017-02-20 DIAGNOSIS — Z87442 Personal history of urinary calculi: Secondary | ICD-10-CM | POA: Insufficient documentation

## 2017-02-20 DIAGNOSIS — G8929 Other chronic pain: Secondary | ICD-10-CM | POA: Diagnosis not present

## 2017-02-20 DIAGNOSIS — I639 Cerebral infarction, unspecified: Secondary | ICD-10-CM | POA: Diagnosis not present

## 2017-02-20 DIAGNOSIS — Z09 Encounter for follow-up examination after completed treatment for conditions other than malignant neoplasm: Secondary | ICD-10-CM | POA: Diagnosis present

## 2017-02-20 DIAGNOSIS — M797 Fibromyalgia: Secondary | ICD-10-CM | POA: Insufficient documentation

## 2017-02-20 DIAGNOSIS — G8191 Hemiplegia, unspecified affecting right dominant side: Secondary | ICD-10-CM | POA: Diagnosis not present

## 2017-02-20 DIAGNOSIS — F600 Paranoid personality disorder: Secondary | ICD-10-CM | POA: Diagnosis not present

## 2017-02-20 DIAGNOSIS — M4726 Other spondylosis with radiculopathy, lumbar region: Secondary | ICD-10-CM | POA: Insufficient documentation

## 2017-02-20 DIAGNOSIS — M4316 Spondylolisthesis, lumbar region: Secondary | ICD-10-CM | POA: Insufficient documentation

## 2017-02-20 DIAGNOSIS — F17210 Nicotine dependence, cigarettes, uncomplicated: Secondary | ICD-10-CM | POA: Insufficient documentation

## 2017-02-20 DIAGNOSIS — Z86010 Personal history of colonic polyps: Secondary | ICD-10-CM | POA: Insufficient documentation

## 2017-02-20 DIAGNOSIS — I69851 Hemiplegia and hemiparesis following other cerebrovascular disease affecting right dominant side: Secondary | ICD-10-CM | POA: Diagnosis not present

## 2017-02-20 DIAGNOSIS — K219 Gastro-esophageal reflux disease without esophagitis: Secondary | ICD-10-CM | POA: Diagnosis not present

## 2017-02-20 DIAGNOSIS — M545 Low back pain: Secondary | ICD-10-CM

## 2017-02-20 DIAGNOSIS — F39 Unspecified mood [affective] disorder: Secondary | ICD-10-CM | POA: Insufficient documentation

## 2017-02-20 DIAGNOSIS — K449 Diaphragmatic hernia without obstruction or gangrene: Secondary | ICD-10-CM | POA: Insufficient documentation

## 2017-02-20 DIAGNOSIS — R531 Weakness: Secondary | ICD-10-CM | POA: Insufficient documentation

## 2017-02-20 DIAGNOSIS — F4310 Post-traumatic stress disorder, unspecified: Secondary | ICD-10-CM | POA: Diagnosis not present

## 2017-02-20 NOTE — Patient Instructions (Signed)

## 2017-02-20 NOTE — Progress Notes (Signed)
Subjective:    Patient ID: Perry Cervantes, male    DOB: 13-May-1950, 66 y.o.   MRN: [id number]  HPI   Mr. Perry Cervantes is here in follow up of his left CR infarct. He was at [facility] for further rehab after CIR. He just got home yesterday. He has experienced continued improvement in his RUE and RLE strength. He figured out that he needs to work hard to improve his outcome.   A "knot" was found on his right lateral hip which he feels has been bothering him. He states it was bothering him while on rehab.  He is still smoking a little but trying to quit. He has been continent of bowel and bladde.r     Pain Inventory Average Pain 8 Pain Right Now 8 My pain is constant, sharp and stabbing  In the last 24 hours, has pain interfered with the following? General activity 7 Relation with others 4 Enjoyment of life 8 What TIME of day is your pain at its worst? . Sleep (in general) Fair  Pain is worse with: walking, bending, sitting, inactivity, standing and some activites Pain improves with: therapy/exercise and TENS Relief from Meds: 2  Mobility walk with assistance use a walker ability to climb steps?  no do you drive?  no use a wheelchair needs help with transfers  Function retired I need assistance with the following:  feeding, dressing, bathing, toileting, meal prep, household duties and shopping  Neuro/Psych bladder control problems bowel control problems weakness tingling trouble walking depression anxiety  Prior Studies Any changes since last visit?  no  Physicians involved in your care Any changes since last visit?  no   Family History  Problem Relation Age of Onset  . Heart attack Mother   . Other Father        deceased age 27 of Staph sepsis  . Colon cancer Neg Hx    Social History   Social History  . Marital status: Married    Spouse name: N/A  . Number of children: 5  . Years of education: N/A   Occupational History  . retired from [employer] [place] [employer]ired   Social History Main Topics  . Smoking status: Current Every Day Smoker    Packs/day: 1.00    Years: 25.00    Types: Cigarettes  . Smokeless tobacco: Never Used  . Alcohol use Yes     Comment: occasional   . Drug use: Yes    Types: Marijuana     Comment: one month ago  used marijuana   . Sexual activity: Not Currently   Other Topics Concern  . Not on file   Social History Narrative  . No narrative on file   Past Surgical History:  Procedure Laterality Date  . BACK SURGERY     x 2  . COLONOSCOPY  03/13/2005   RMR: Normal rectum. Diminutive polyp at hepatic flexure cold biopsied/removed (inflamed, benign). Otherwise normal colon, normal terminal ileum  . COLONOSCOPY N/A 07/28/2012   RMR: Colonic diverticulosis. diminutive colonic polyps (hyerplastic)-removed as described above. Colonic erosion ulceration ad described above. Status post biopsy. I suspect NSAID insult to his colon rather than inflammatory bowel disease . These finding could easily explain Hemocult-positve stool. EGD not needed at this time. Next TCS 07/2022.  . ESOPHAGOGASTRODUODENOSCOPY  03/13/2005   RMR: Small hiatal hernia  . NECK SURGERY  2008   fusion  . Right knee arthroscopy     x 2  Past Medical History:  Diagnosis Date  . Anxiety   . Chronic back pain    Spondylolisthesis, spondylosis, scoliosis, and radiculopathy  . Fibromyalgia   . Gastroesophageal reflux disease   . History of colon polyps   . History of kidney stones   . Hypertension   . Mild depression ([place])   . PTSD (post-traumatic stress disorder)   . Spondylolysis of lumbar region    There were no vitals taken for this visit.  Opioid Risk Score:   Fall Risk Score:  `1  Depression screen PHQ 2/9  No flowsheet data found.   Review of Systems  Constitutional: Negative.   HENT: Negative.   Eyes: Negative.   Respiratory: Positive for cough.   Cardiovascular: Negative.   Gastrointestinal: Negative.   Endocrine:  Negative.   Genitourinary: Negative.   Musculoskeletal: Positive for joint swelling.  Skin: Negative.   Allergic/Immunologic: Negative.   Neurological: Negative.   Hematological: Negative.   Psychiatric/Behavioral: Negative.   All other systems reviewed and are negative.      Objective:   Physical Exam   General: Alert and oriented x 3, No apparent distress. In w/c.  HEENT: Head is normocephalic, atraumatic, PERRLA, EOMI, sclera anicteric, oral mucosa pink and moist, dentition intact, ext ear canals clear,  Neck: Supple without JVD or lymphadenopathy Heart: Reg rate and rhythm. No murmurs rubs or gallops Chest: CTA bilaterally without wheezes, rales, or rhonchi; no distress Abdomen: Soft, non-tender, non-distended, bowel sounds positive. Extremities: No clubbing, cyanosis, or edema. Pulses are 2+ Skin: Clean and intact without signs of breakdown Neuro: Pt is cognitively appropriate with normal insight, memory, and awareness. Cranial nerves 2-12 are intact. Sensory exam is normal. Reflexes are 2+ in all 4's. Fine motor coordination is intact. No tremors. RUE 2-3/5 flexor movement, grip 1+. RLE: 2 to 2+/5 prox to distal. .  Musculoskeletal: Pain along right greater troch pain. No mass.  Psych: Pt's affect is appropriate. A little impulsive        Assessment & Plan:  1. 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 Will be outpt candidate eventualy    -rx for seat cushion rx'ed            2  Pain Management/chronic back pain: Neurontin 300 mg 3 times a day,   -gave pt information about trochanteric bursitis. I see no "mass" on right hip.  -reviewed exercises today  -wrote for TENS for LBP 4. Mood/PTSD/paranoid personality disorder: Lexapro 20 mg daily, Seroquel 100 mg daily, Valium 10 mg TID             -per primary   FOllow up in 6 weeks. Fifteen minutes of face to face patient care time were spent during this visit. All  questions were encouraged and answered.

## 2017-03-04 ENCOUNTER — Telehealth (HOSPITAL_COMMUNITY): Payer: Self-pay | Admitting: *Deleted

## 2017-03-04 NOTE — Telephone Encounter (Signed)
voice message from patient's wife, said to cancel appointment for 22-Mar-2017.  She said he passed away yesterday afternoon.

## 2017-03-07 ENCOUNTER — Ambulatory Visit (HOSPITAL_COMMUNITY): Payer: Self-pay | Admitting: Psychiatry

## 2017-03-23 DEATH — deceased

## 2017-04-03 ENCOUNTER — Ambulatory Visit: Payer: Self-pay | Admitting: Physical Medicine & Rehabilitation

## 2018-10-05 IMAGING — DX DG ELBOW COMPLETE 3+V*R*
4 series · 4 of 4 positions shown · non-contrast
Comparison: None.

CLINICAL DATA: Right elbow abrasion, fall

EXAM:
RIGHT ELBOW - COMPLETE 3+ VIEW

[elbow ap]
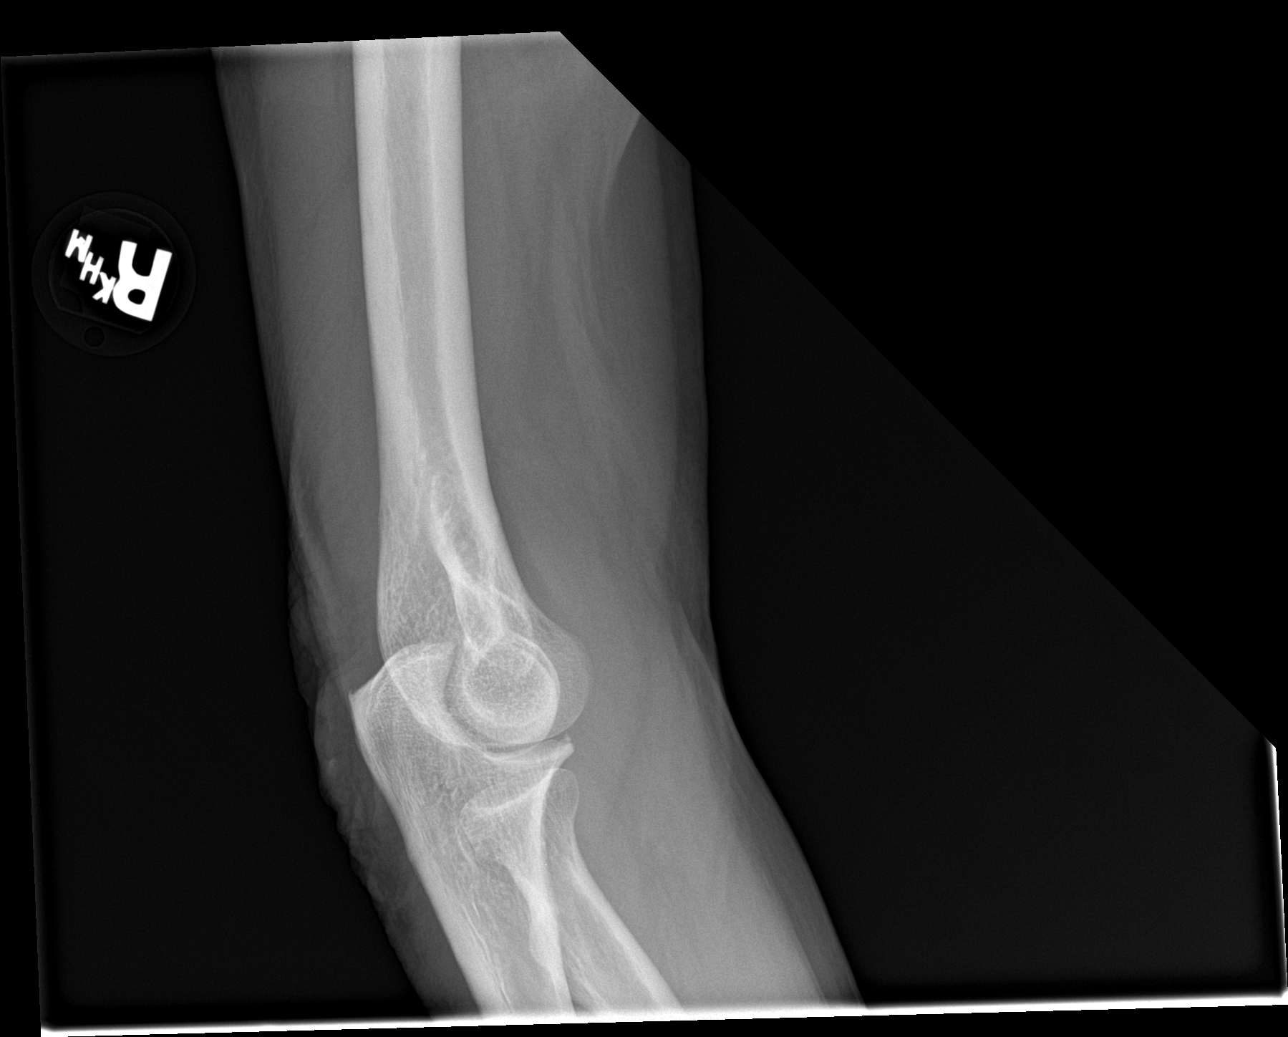

[elbow obl (1 of 2)]
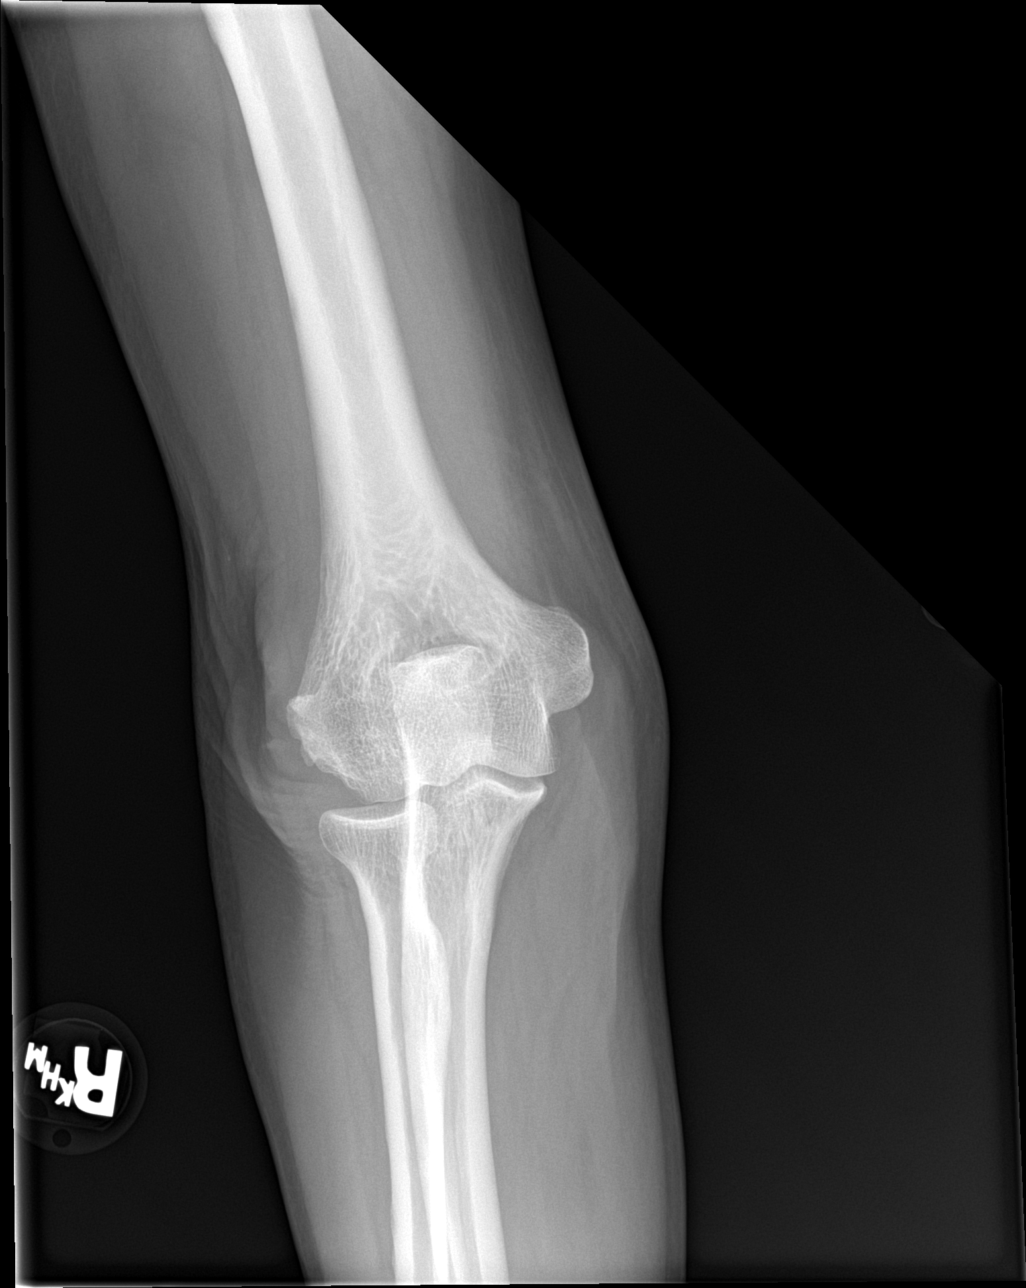

[elbow lat]
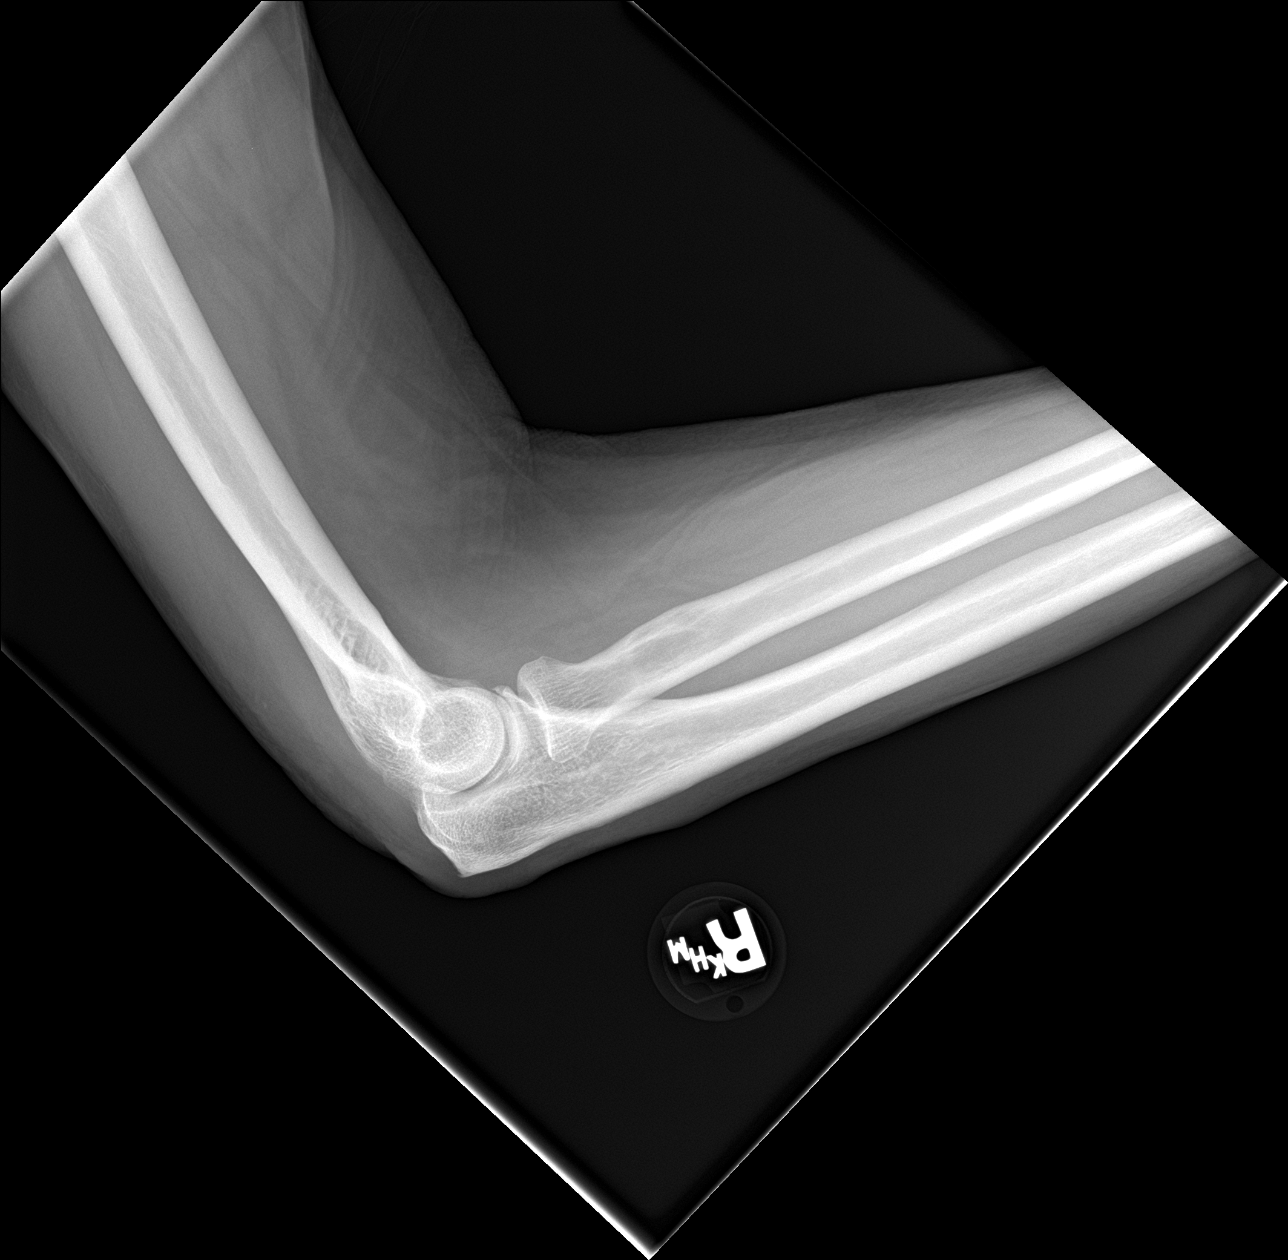

[elbow obl (2 of 2)]
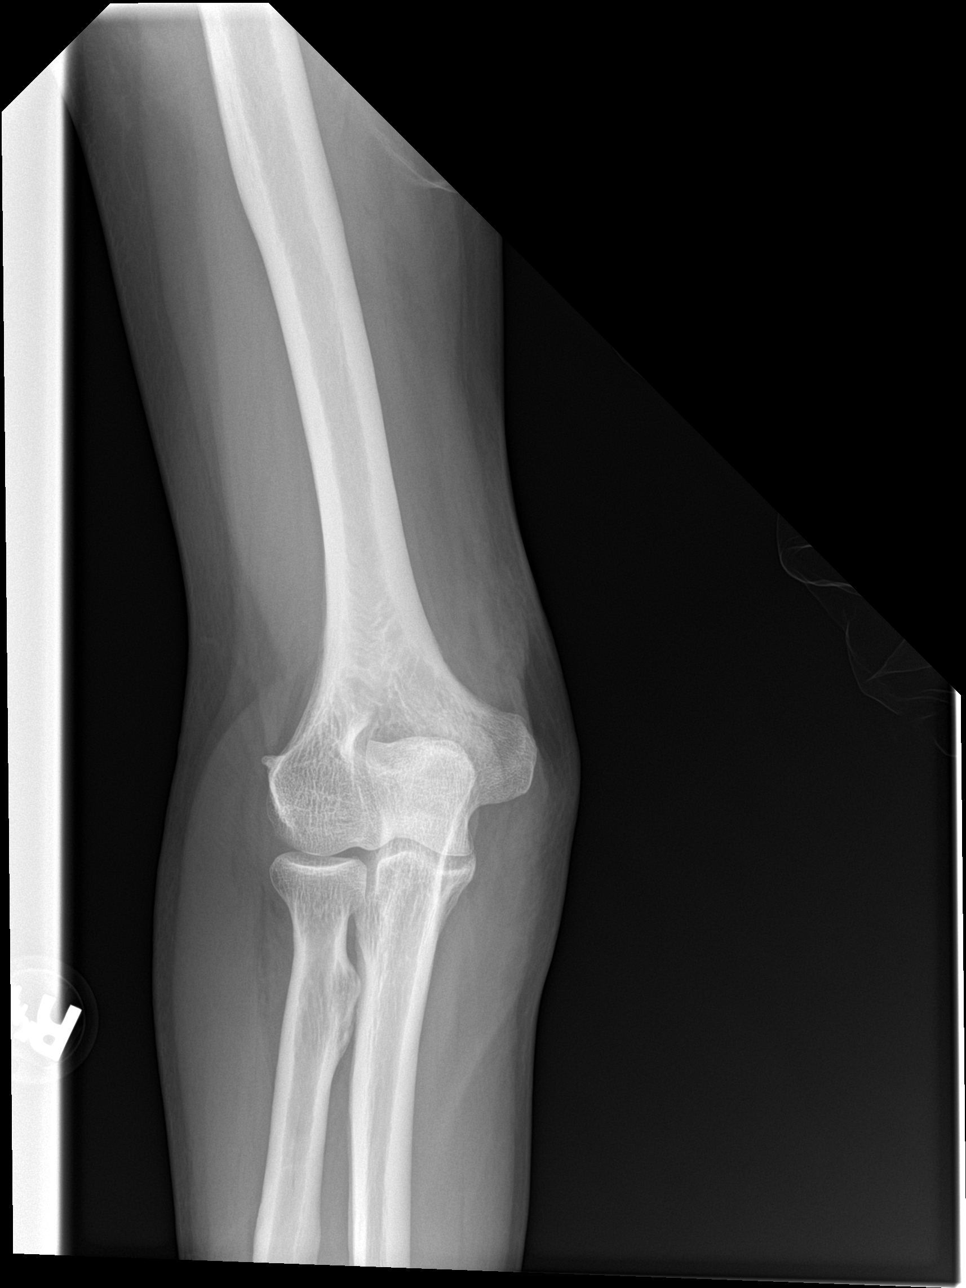

[4 of 4 positions shown; findings below may reference images not displayed]

FINDINGS: There is no evidence of fracture, dislocation, or joint effusion.
There is no evidence of arthropathy or other focal bone abnormality.
Soft tissues are unremarkable.
IMPRESSION: Negative.

## 2018-10-05 IMAGING — DX DG KNEE COMPLETE 4+V*L*
4 series · 4 of 4 positions shown · non-contrast
Comparison: None.

CLINICAL DATA: Fall with red and warm knee

EXAM:
LEFT KNEE - COMPLETE 4+ VIEW

[knee ap (1 of 3)]
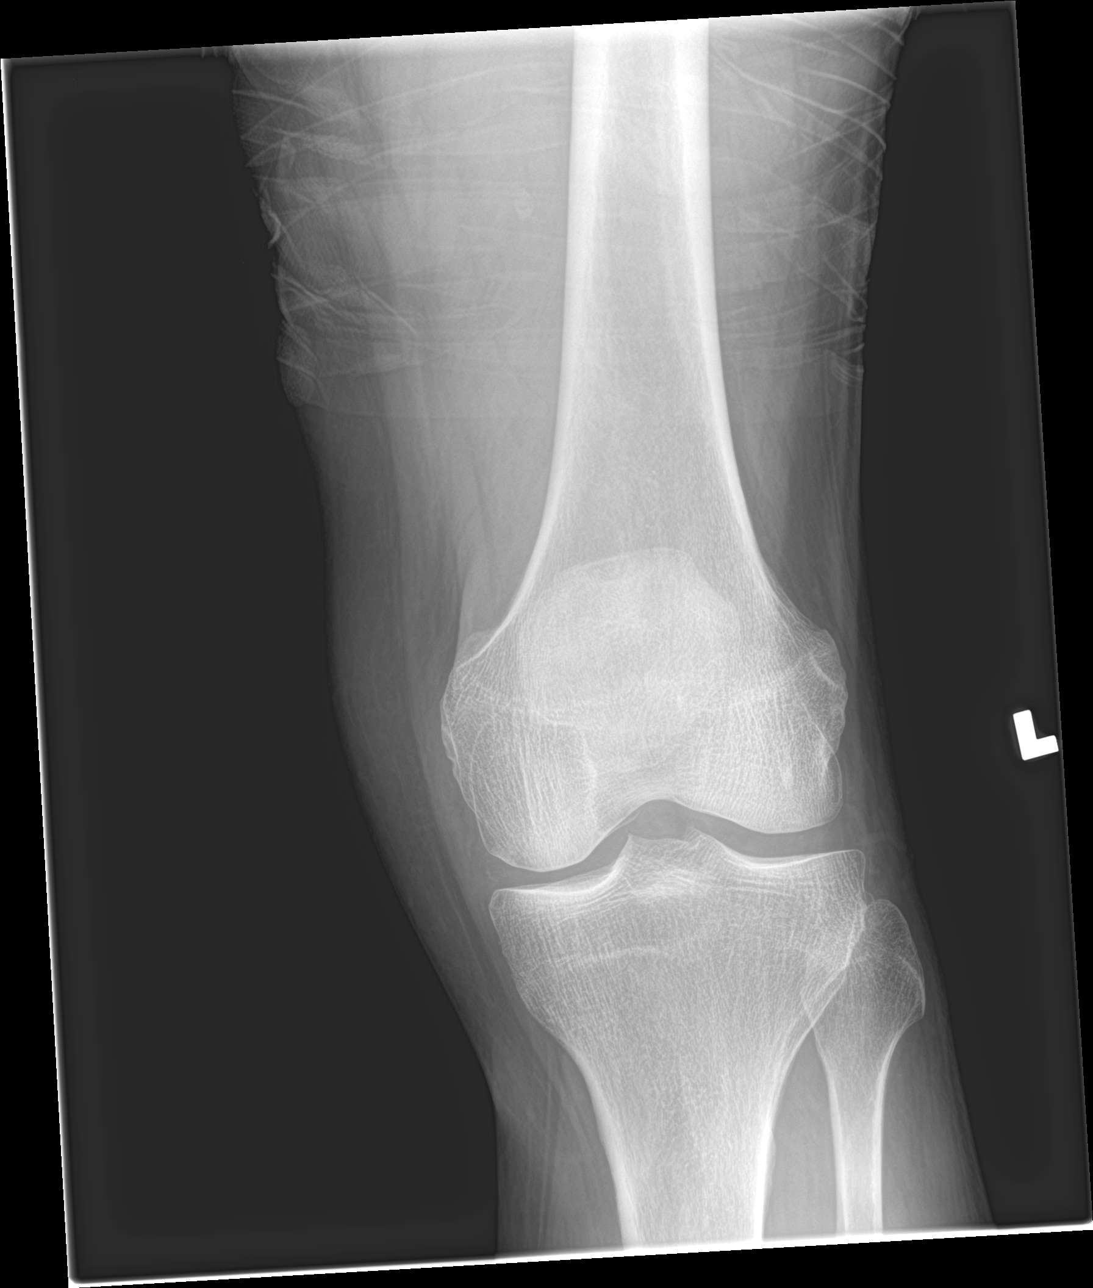

[knee lat]
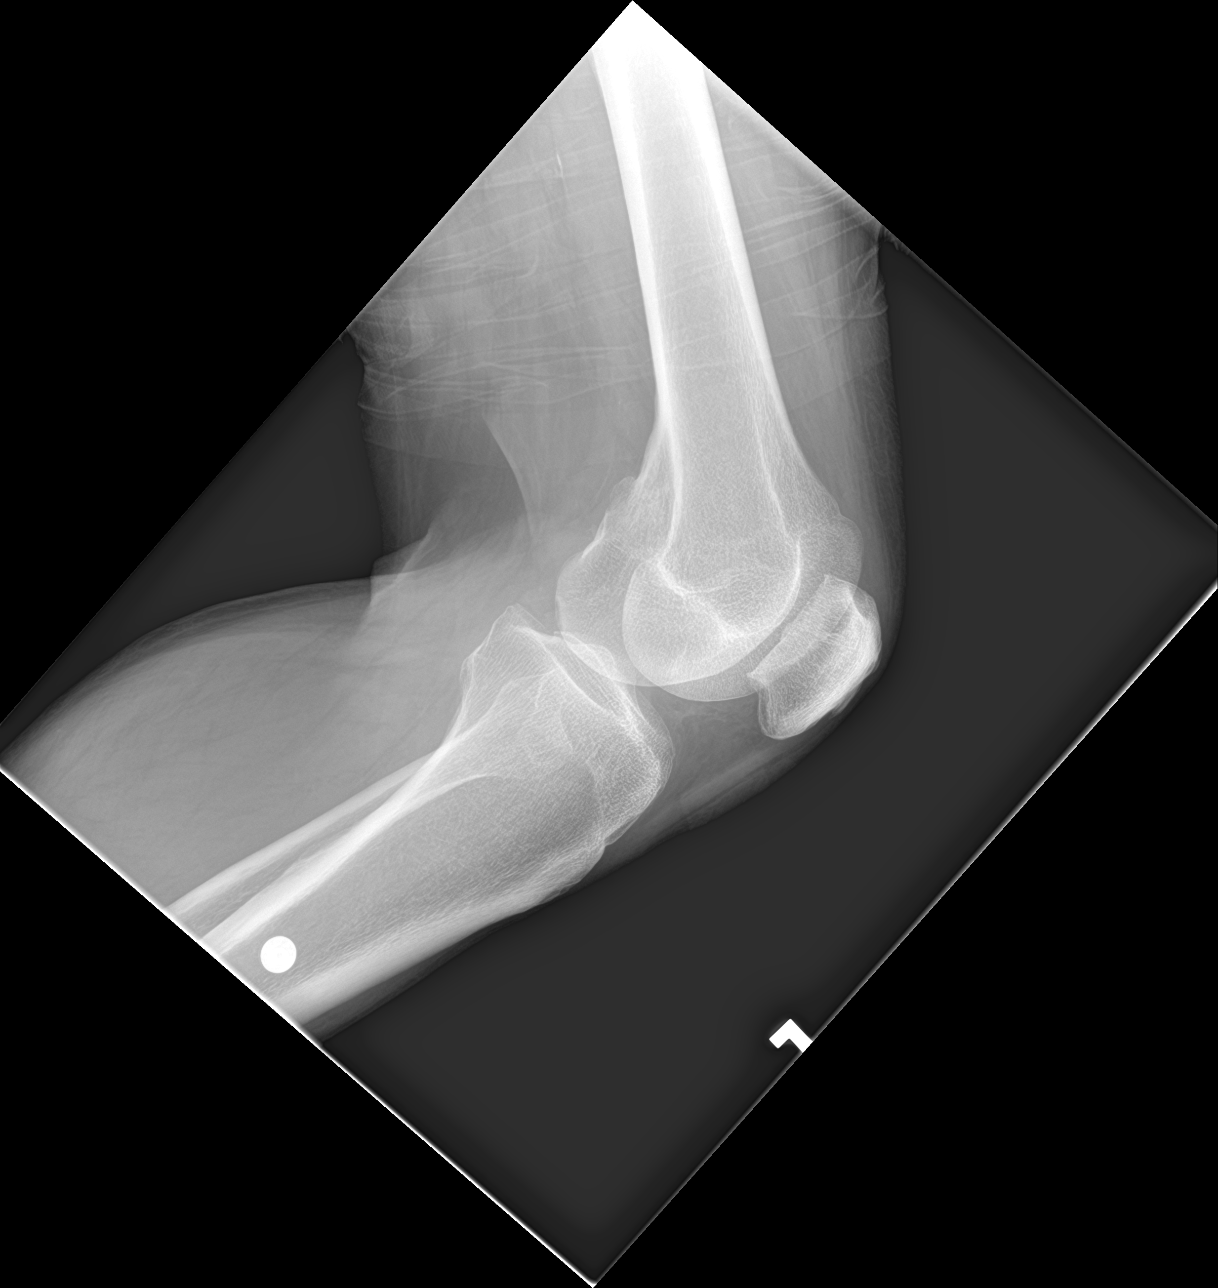

[knee ap (2 of 3)]
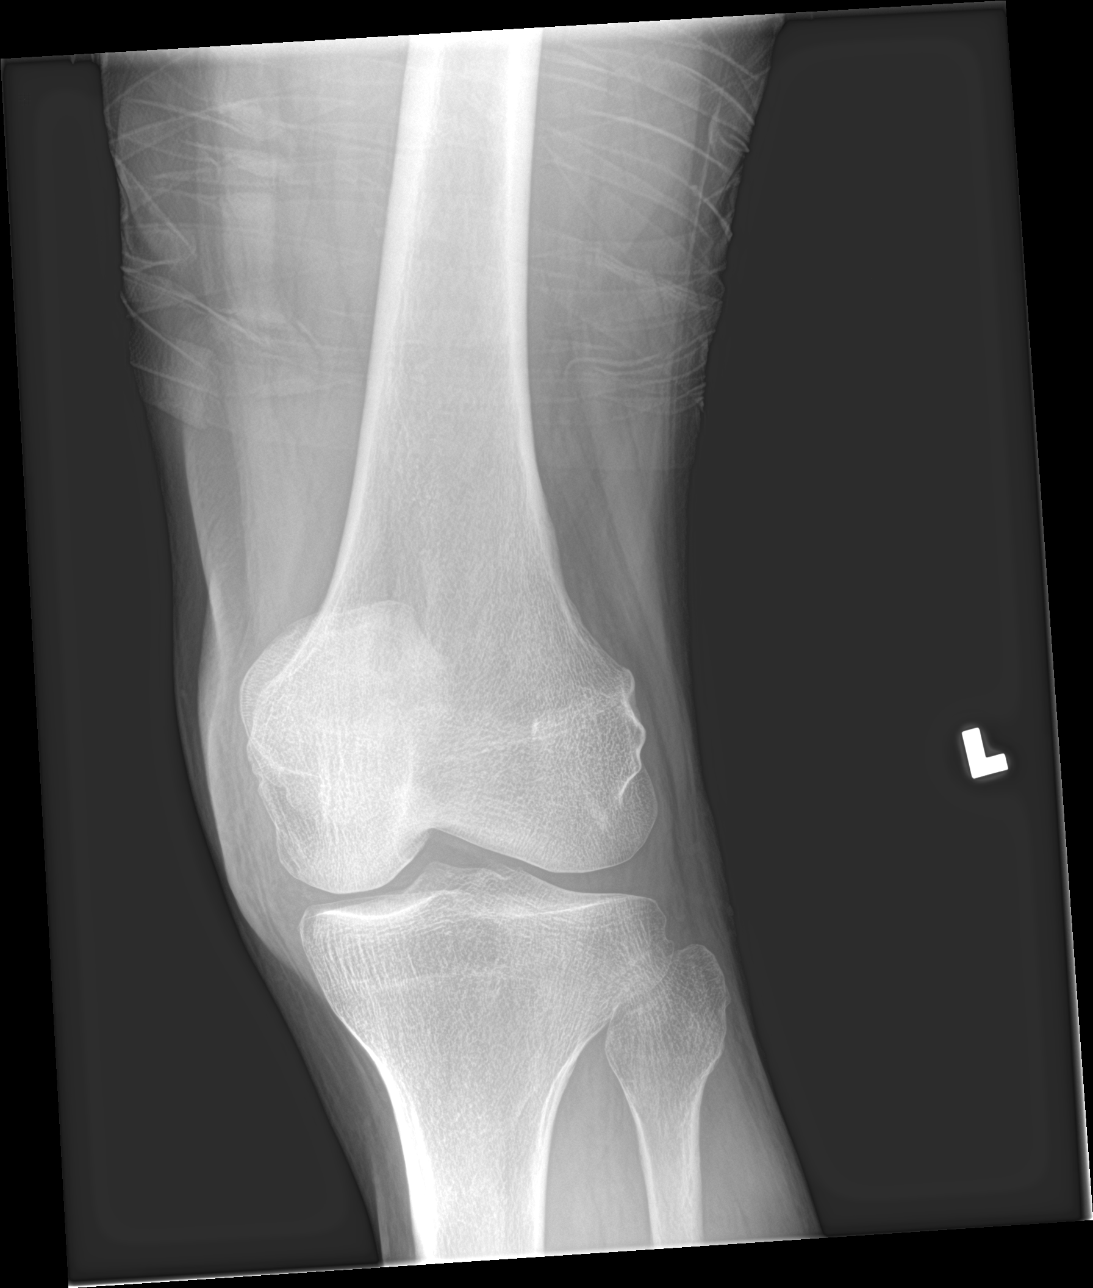

[knee ap (3 of 3)]
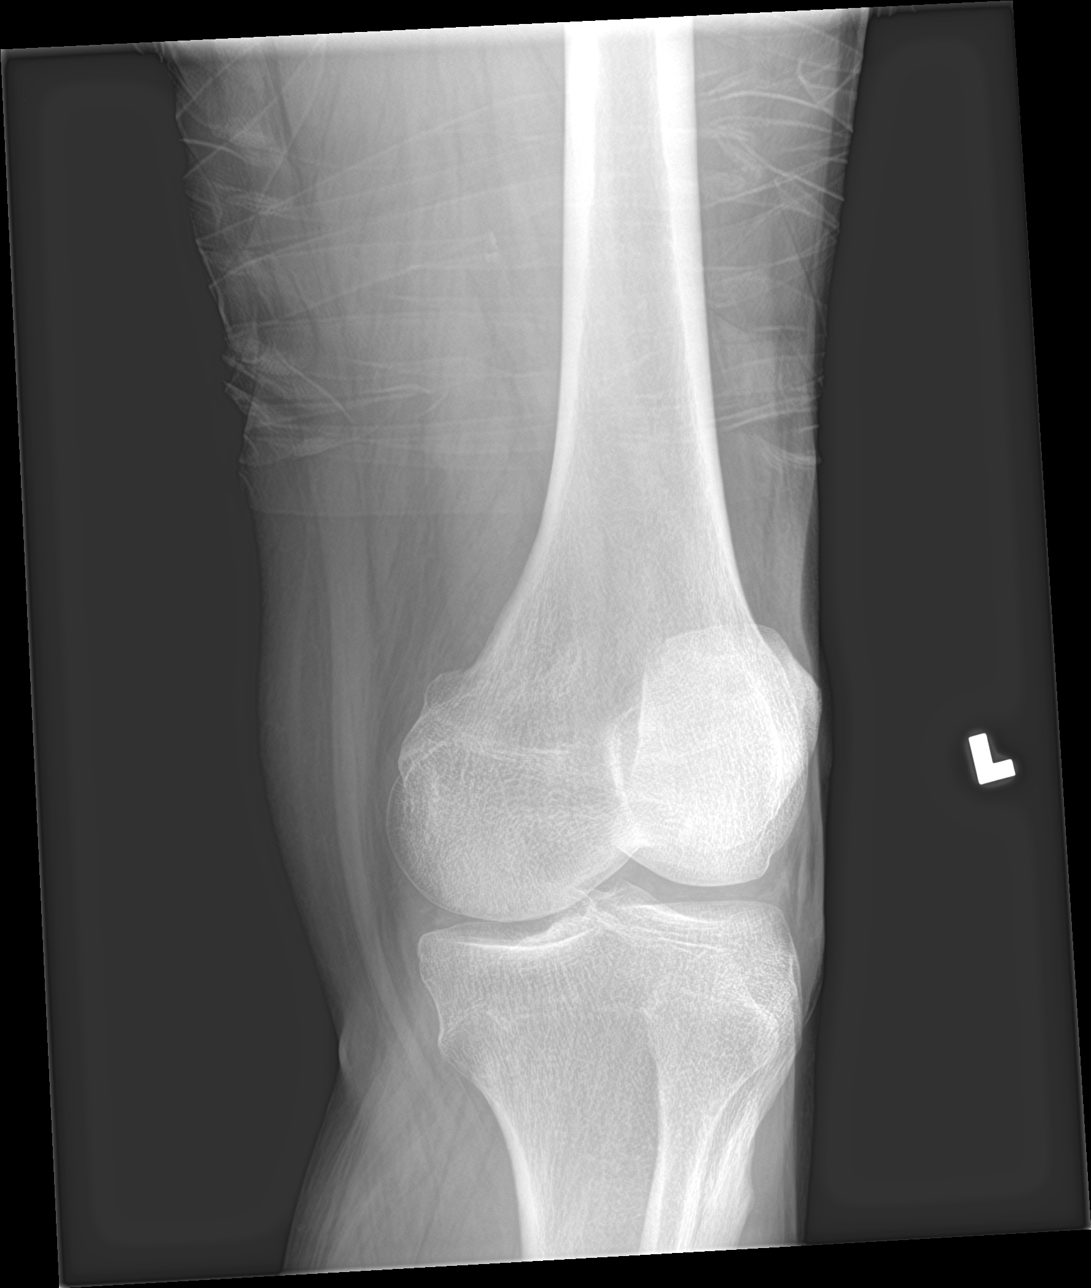

[4 of 4 positions shown; findings below may reference images not displayed]

FINDINGS: No fracture or malalignment. No large effusion. Mild patellofemoral
degenerative changes. Mild changes of the medial compartment.
IMPRESSION: Mild degenerative changes.  No acute osseous abnormality.
